# Patient Record
Sex: Male | Born: 1950 | Race: White | Hispanic: No | Marital: Married | Smoking: Never smoker
Health system: Southern US, Community
[De-identification: ages and names within clinical notes are randomized; demographics above are authoritative.]

## PROBLEM LIST (undated history)

## (undated) DIAGNOSIS — T7840XA Allergy, unspecified, initial encounter: Secondary | ICD-10-CM

## (undated) DIAGNOSIS — R7303 Prediabetes: Secondary | ICD-10-CM

## (undated) DIAGNOSIS — I10 Essential (primary) hypertension: Secondary | ICD-10-CM

## (undated) DIAGNOSIS — H269 Unspecified cataract: Secondary | ICD-10-CM

## (undated) DIAGNOSIS — E785 Hyperlipidemia, unspecified: Secondary | ICD-10-CM

## (undated) DIAGNOSIS — J449 Chronic obstructive pulmonary disease, unspecified: Secondary | ICD-10-CM

## (undated) DIAGNOSIS — R001 Bradycardia, unspecified: Secondary | ICD-10-CM

## (undated) DIAGNOSIS — M1990 Unspecified osteoarthritis, unspecified site: Secondary | ICD-10-CM

## (undated) DIAGNOSIS — F419 Anxiety disorder, unspecified: Secondary | ICD-10-CM

## (undated) DIAGNOSIS — E119 Type 2 diabetes mellitus without complications: Secondary | ICD-10-CM

## (undated) HISTORY — DX: Hyperlipidemia, unspecified: E78.5

## (undated) HISTORY — DX: Essential (primary) hypertension: I10

## (undated) HISTORY — DX: Type 2 diabetes mellitus without complications: E11.9

## (undated) HISTORY — DX: Unspecified osteoarthritis, unspecified site: M19.90

## (undated) HISTORY — DX: Chronic obstructive pulmonary disease, unspecified: J44.9

## (undated) HISTORY — DX: Unspecified cataract: H26.9

## (undated) HISTORY — PX: INGUINAL HERNIA REPAIR: SUR1180

## (undated) HISTORY — DX: Anxiety disorder, unspecified: F41.9

## (undated) HISTORY — DX: Bradycardia, unspecified: R00.1

## (undated) HISTORY — DX: Prediabetes: R73.03

## (undated) HISTORY — DX: Allergy, unspecified, initial encounter: T78.40XA

---

## 1976-11-09 HISTORY — PX: OTHER SURGICAL HISTORY: SHX169

## 2008-01-20 ENCOUNTER — Ambulatory Visit: Payer: Self-pay | Admitting: Family Medicine

## 2009-10-01 ENCOUNTER — Ambulatory Visit: Payer: Self-pay | Admitting: General Surgery

## 2009-10-01 LAB — HM COLONOSCOPY

## 2010-11-04 ENCOUNTER — Ambulatory Visit: Payer: Self-pay | Admitting: Family Medicine

## 2013-09-07 LAB — CBC AND DIFFERENTIAL
HEMATOCRIT: 45 % (ref 41–53)
Hemoglobin: 15.7 g/dL (ref 13.5–17.5)
NEUTROS ABS: 4 /uL
PLATELETS: 192 10*3/uL (ref 150–399)
WBC: 6 10^3/mL

## 2013-09-07 LAB — TSH: TSH: 1.96 u[IU]/mL (ref <=5.90)

## 2013-09-07 LAB — PSA: PSA: 1.3

## 2013-12-19 LAB — HEMOGLOBIN A1C: Hgb A1c MFr Bld: 5.2 % (ref 4.0–6.0)

## 2013-12-25 ENCOUNTER — Emergency Department: Payer: Self-pay | Admitting: Emergency Medicine

## 2014-06-28 LAB — LIPID PANEL
CHOLESTEROL: 141 mg/dL (ref 0–200)
HDL: 51 mg/dL (ref 35–70)
LDL Cholesterol: 71 mg/dL
LDl/HDL Ratio: 1.4
Triglycerides: 96 mg/dL (ref 40–160)

## 2014-06-28 LAB — BASIC METABOLIC PANEL
BUN: 27 mg/dL — AB (ref 4–21)
Creatinine: 1.2 mg/dL (ref 0.6–1.3)
Glucose: 131 mg/dL
POTASSIUM: 5.1 mmol/L (ref 3.4–5.3)
SODIUM: 143 mmol/L (ref 137–147)

## 2014-06-28 LAB — HEPATIC FUNCTION PANEL
ALK PHOS: 59 U/L (ref 25–125)
ALT: 41 U/L — AB (ref 10–40)
AST: 21 U/L (ref 14–40)

## 2015-01-01 DIAGNOSIS — Z0000 Encounter for general adult medical examination without abnormal findings: Secondary | ICD-10-CM | POA: Diagnosis not present

## 2015-01-01 DIAGNOSIS — Z23 Encounter for immunization: Secondary | ICD-10-CM | POA: Diagnosis not present

## 2015-05-08 DIAGNOSIS — Z7289 Other problems related to lifestyle: Secondary | ICD-10-CM | POA: Insufficient documentation

## 2015-05-08 DIAGNOSIS — H9190 Unspecified hearing loss, unspecified ear: Secondary | ICD-10-CM | POA: Insufficient documentation

## 2015-05-08 DIAGNOSIS — G4700 Insomnia, unspecified: Secondary | ICD-10-CM | POA: Insufficient documentation

## 2015-05-08 DIAGNOSIS — Z789 Other specified health status: Secondary | ICD-10-CM | POA: Insufficient documentation

## 2015-05-08 DIAGNOSIS — K219 Gastro-esophageal reflux disease without esophagitis: Secondary | ICD-10-CM | POA: Insufficient documentation

## 2015-05-08 DIAGNOSIS — E559 Vitamin D deficiency, unspecified: Secondary | ICD-10-CM | POA: Insufficient documentation

## 2015-05-08 DIAGNOSIS — E1169 Type 2 diabetes mellitus with other specified complication: Secondary | ICD-10-CM | POA: Insufficient documentation

## 2015-05-08 DIAGNOSIS — E785 Hyperlipidemia, unspecified: Secondary | ICD-10-CM | POA: Insufficient documentation

## 2015-05-08 DIAGNOSIS — F320 Major depressive disorder, single episode, mild: Secondary | ICD-10-CM | POA: Insufficient documentation

## 2015-05-08 DIAGNOSIS — R739 Hyperglycemia, unspecified: Secondary | ICD-10-CM | POA: Insufficient documentation

## 2015-05-08 DIAGNOSIS — F419 Anxiety disorder, unspecified: Secondary | ICD-10-CM | POA: Insufficient documentation

## 2015-05-08 DIAGNOSIS — I10 Essential (primary) hypertension: Secondary | ICD-10-CM | POA: Insufficient documentation

## 2015-05-08 DIAGNOSIS — M1990 Unspecified osteoarthritis, unspecified site: Secondary | ICD-10-CM | POA: Insufficient documentation

## 2015-05-23 ENCOUNTER — Telehealth: Payer: Self-pay | Admitting: Family Medicine

## 2015-07-01 ENCOUNTER — Ambulatory Visit (INDEPENDENT_AMBULATORY_CARE_PROVIDER_SITE_OTHER): Payer: Medicare Other | Admitting: Family Medicine

## 2015-07-01 ENCOUNTER — Encounter: Payer: Self-pay | Admitting: Family Medicine

## 2015-07-01 VITALS — BP 142/76 | HR 58 | Temp 97.9°F | Resp 16 | Wt 225.0 lb

## 2015-07-01 DIAGNOSIS — R739 Hyperglycemia, unspecified: Secondary | ICD-10-CM | POA: Diagnosis not present

## 2015-07-01 DIAGNOSIS — I10 Essential (primary) hypertension: Secondary | ICD-10-CM

## 2015-07-01 DIAGNOSIS — E785 Hyperlipidemia, unspecified: Secondary | ICD-10-CM

## 2015-07-01 MED ORDER — OLMESARTAN-AMLODIPINE-HCTZ 40-10-25 MG PO TABS: 1.0000 | ORAL_TABLET | Freq: Every day | ORAL | Status: DC

## 2015-07-01 NOTE — Patient Instructions (Signed)
Bring in a record of Blood pressure readings, starting now until your next office visit.

## 2015-07-01 NOTE — Progress Notes (Signed)
Patient ID: Christian [name], male   DOB: June 01, 1951, 64 y.o.   MRN: [id number]    Subjective:  HPI  Hypertension, follow-up:  BP Readings from Last 3 Encounters:  07/01/15 142/76  01/01/15 126/72    He was last seen for hypertension 6 months ago.  BP at that visit was 126/72. Management changes since that visit include none. He reports good compliance with treatment. He is not having side effects.  He is not exercising. He is adherent to low salt diet.   Outside blood pressures are 140's/70's. He is experiencing none.    Weight trend: stable Wt Readings from Last 3 Encounters:  07/01/15 225 lb (102.059 kg)  01/01/15 220 lb (99.791 kg)     ------------------------------------------------------------------------   Lipid/Cholesterol, Follow-up:   Last seen for this6 months ago.  Management changes since that visit include none. . Last Lipid Panel:    Component Value Date/Time   CHOL 141 06/28/2014   TRIG 96 06/28/2014   HDL 51 06/28/2014   LDLCALC 71 06/28/2014    He reports good compliance with treatment. He is not having side effects.  Current symptoms include none  Wt Readings from Last 3 Encounters:  07/01/15 225 lb (102.059 kg)  01/01/15 220 lb (99.791 kg)    ------------------------------------------------------------------- Pt is due for labs. According to note on 06/28/14, pt needs Calcium, PTH and A1C checked at next OV.  Prior to Admission medications   Medication Sig Start Date End Date Taking? Authorizing Provider  aspirin 81 MG tablet Take by mouth. 02/11/12  Yes Historical Provider, MD  atorvastatin (LIPITOR) 40 MG tablet Take by mouth. 04/01/15  Yes Historical Provider, MD  cholecalciferol (VITAMIN D) 1000 UNITS tablet Take by mouth. 02/11/12  Yes Historical Provider, MD  MULTIPLE VITAMINS PO Take by mouth. 02/11/12  Yes Historical Provider, MD  Olmesartan-Amlodipine-HCTZ (TRIBENZOR) 40-5-25 MG TABS Take by mouth. 02/25/15  Yes Historical  Provider, MD    Patient Active Problem List   Diagnosis Date Noted  . Alcohol drinker 05/08/2015  . Anxiety 05/08/2015  . Depression, major, single episode, mild 05/08/2015  . Acid reflux 05/08/2015  . Difficulty hearing 05/08/2015  . Blood glucose elevated 05/08/2015  . HLD (hyperlipidemia) 05/08/2015  . BP (high blood pressure) 05/08/2015  . Cannot sleep 05/08/2015  . Osteoarthrosis 05/08/2015  . Avitaminosis D 05/08/2015    History reviewed. No pertinent past medical history.  Social History   Social History  . Marital Status: Married    Spouse Name: N/A  . Number of Children: N/A  . Years of Education: N/A   Occupational History  . Not on file.   Social History Main Topics  . Smoking status: Never Smoker   . Smokeless tobacco: Not on file  . Alcohol Use: Yes     Comment: 12-14 Drinks a week.  . Drug Use: No  . Sexual Activity: Not on file   Other Topics Concern  . Not on file   Social History Narrative    No Known Allergies  Review of Systems  Constitutional: Negative.   HENT: Negative.   Eyes: Negative.   Cardiovascular: Negative.   Gastrointestinal: Negative.   Musculoskeletal: Negative.   Skin: Negative.   Neurological: Negative.   Endo/Heme/Allergies: Negative.   Psychiatric/Behavioral: Negative.   All other systems reviewed and are negative.   Immunization History  Administered Date(s) Administered  . Pneumococcal Conjugate-13 01/01/2015  . Tdap 07/29/2011  . Zoster 09/07/2013   Objective:  BP 142/76 mmHg  Pulse  58  Temp(Src) 97.9 F (36.6 C) (Oral)  Resp 16  Wt 225 lb (102.059 kg)  Physical Exam  Constitutional: He is oriented to person, place, and time and well-developed, well-nourished, and in no distress.  HENT:  Head: Normocephalic and atraumatic.  Right Ear: External ear normal.  Left Ear: External ear normal.  Nose: Nose normal.  Eyes: Conjunctivae and EOM are normal. Pupils are equal, round, and reactive to light.    Neck: Normal range of motion. Neck supple.  Cardiovascular: Normal rate, regular rhythm, normal heart sounds and intact distal pulses.   Pulmonary/Chest: Effort normal and breath sounds normal.  Neurological: He is alert and oriented to person, place, and time. He has normal reflexes. Gait normal. GCS score is 15.  Skin: Skin is warm and dry.  Psychiatric: Mood, memory, affect and judgment normal.    Lab Results  Component Value Date   WBC 6.0 09/07/2013   HGB 15.7 09/07/2013   HCT 45 09/07/2013   PLT 192 09/07/2013   CHOL 141 06/28/2014   TRIG 96 06/28/2014   HDL 51 06/28/2014   LDLCALC 71 06/28/2014   TSH 1.96 09/07/2013   PSA 1.3 09/07/2013   HGBA1C 5.2 12/19/2013    CMP     Component Value Date/Time   NA 143 06/28/2014   K 5.1 06/28/2014   BUN 27* 06/28/2014   CREATININE 1.2 06/28/2014   AST 21 06/28/2014   ALT 41* 06/28/2014   ALKPHOS 59 06/28/2014    Assessment and Plan :  1. Essential hypertension Increase amlodipine dose in the Tribenzor. Patient is warned about the side effects of  edema - Olmesartan-Amlodipine-HCTZ 40-10-25 MG TABS; Take 1 tablet by mouth daily.  Dispense: 30 tablet; Refill: 12 - CBC with Differential/Platelet - TSH  2. HLD (hyperlipidemia)  - Lipid Panel With LDL/HDL Ratio - COMPLETE METABOLIC PANEL WITH GFR  3. Blood glucose elevated  - Hemoglobin A1c - COMPLETE METABOLIC PANEL WITH GFR  4. Hypercalcemia  - Parathyroid hormone, intact (no Ca) 5. Obesity  Julieanne Manson MD [facility] 07/01/2015 8:27 AM

## 2015-07-02 LAB — CBC WITH DIFFERENTIAL/PLATELET
Basophils Absolute: 0 10*3/uL (ref 0.0–0.2)
Basos: 0 %
EOS (ABSOLUTE): 0.1 10*3/uL (ref 0.0–0.4)
EOS: 2 %
Hematocrit: 42.9 % (ref 37.5–51.0)
Hemoglobin: 15.3 g/dL (ref 12.6–17.7)
IMMATURE GRANULOCYTES: 0 %
Immature Grans (Abs): 0 10*3/uL (ref 0.0–0.1)
Lymphocytes Absolute: 1.2 10*3/uL (ref 0.7–3.1)
Lymphs: 21 %
MCH: 32.3 pg (ref 26.6–33.0)
MCHC: 35.7 g/dL (ref 31.5–35.7)
MCV: 91 fL (ref 79–97)
MONOS ABS: 0.4 10*3/uL (ref 0.1–0.9)
Monocytes: 7 %
NEUTROS PCT: 70 %
Neutrophils Absolute: 3.9 10*3/uL (ref 1.4–7.0)
PLATELETS: 202 10*3/uL (ref 150–379)
RBC: 4.74 x10E6/uL (ref 4.14–5.80)
RDW: 14.2 % (ref 12.3–15.4)
WBC: 5.7 10*3/uL (ref 3.4–10.8)

## 2015-07-02 LAB — TSH: TSH: 1.57 u[IU]/mL (ref 0.450–4.500)

## 2015-07-02 LAB — COMPREHENSIVE METABOLIC PANEL
A/G RATIO: 1.8 (ref 1.1–2.5)
ALK PHOS: 61 IU/L (ref 39–117)
ALT: 34 IU/L (ref 0–44)
AST: 18 IU/L (ref 0–40)
Albumin: 4.5 g/dL (ref 3.6–4.8)
BILIRUBIN TOTAL: 0.6 mg/dL (ref 0.0–1.2)
BUN/Creatinine Ratio: 19 (ref 10–22)
BUN: 20 mg/dL (ref 8–27)
CHLORIDE: 101 mmol/L (ref 97–108)
CO2: 25 mmol/L (ref 18–29)
Calcium: 9.9 mg/dL (ref 8.6–10.2)
Creatinine, Ser: 1.05 mg/dL (ref 0.76–1.27)
GFR calc non Af Amer: 74 mL/min/{1.73_m2} (ref >59)
GFR, EST AFRICAN AMERICAN: 85 mL/min/{1.73_m2} (ref >59)
GLUCOSE: 130 mg/dL — AB (ref 65–99)
Globulin, Total: 2.5 g/dL (ref 1.5–4.5)
POTASSIUM: 5 mmol/L (ref 3.5–5.2)
Sodium: 140 mmol/L (ref 134–144)
Total Protein: 7 g/dL (ref 6.0–8.5)

## 2015-07-02 LAB — LIPID PANEL WITH LDL/HDL RATIO
Cholesterol, Total: 138 mg/dL (ref 100–199)
HDL: 47 mg/dL (ref >39)
LDL CALC: 66 mg/dL (ref 0–99)
LDl/HDL Ratio: 1.4 ratio units (ref 0.0–3.6)
Triglycerides: 125 mg/dL (ref 0–149)
VLDL Cholesterol Cal: 25 mg/dL (ref 5–40)

## 2015-07-02 LAB — HEMOGLOBIN A1C
ESTIMATED AVERAGE GLUCOSE: 128 mg/dL
Hgb A1c MFr Bld: 6.1 % — ABNORMAL HIGH (ref 4.8–5.6)

## 2015-07-02 LAB — PARATHYROID HORMONE, INTACT (NO CA): PTH: 30 pg/mL (ref 15–65)

## 2015-07-24 ENCOUNTER — Other Ambulatory Visit: Payer: Self-pay | Admitting: Family Medicine

## 2015-08-26 DIAGNOSIS — H10213 Acute toxic conjunctivitis, bilateral: Secondary | ICD-10-CM | POA: Diagnosis not present

## 2015-09-09 ENCOUNTER — Encounter: Payer: Self-pay | Admitting: Family Medicine

## 2015-09-09 ENCOUNTER — Ambulatory Visit (INDEPENDENT_AMBULATORY_CARE_PROVIDER_SITE_OTHER): Payer: Medicare Other | Admitting: Family Medicine

## 2015-09-09 VITALS — BP 120/68 | HR 78 | Temp 98.0°F | Resp 16 | Wt 227.0 lb

## 2015-09-09 DIAGNOSIS — I10 Essential (primary) hypertension: Secondary | ICD-10-CM

## 2015-09-09 DIAGNOSIS — Z23 Encounter for immunization: Secondary | ICD-10-CM | POA: Diagnosis not present

## 2015-09-09 NOTE — Progress Notes (Signed)
Patient ID: Christian FleetingStephen L Ocain, male   DOB: [birth year], 64 y.o.   MRN: [id number]    Subjective:  HPI  Hypertension, follow-up:  BP Readings from Last 3 Encounters:  09/09/15 120/68  07/01/15 142/76  01/01/15 126/72    He was last seen for hypertension 2 months ago.  BP at that visit was 142/76. Management since that visit includes increase the Amlodipine part of the Tribenzor. He reports good compliance with treatment. He is having possible side effects, he reports that he has had a little hacky cough since the increase and mild ankle swelling. IT happens more at night and in the mornings. He is not exercising. Outside blood pressures are 120-140/ 60-70's. Patient denies chest pain, chest pressure/discomfort, claudication, exertional chest pressure/discomfort, fatigue and palpitations.     Wt Readings from Last 3 Encounters:  09/09/15 227 lb (102.967 kg)  07/01/15 225 lb (102.059 kg)  01/01/15 220 lb (99.791 kg)     ------------------------------------------------------------------------     Prior to Admission medications   Medication Sig Start Date End Date Taking? Authorizing Provider  aspirin 81 MG tablet Take by mouth. 02/11/12  Yes Historical Provider, MD  atorvastatin (LIPITOR) 40 MG tablet Take 1 tablet by mouth at  bedtime 07/24/15  Yes Austina Constantin Hulen ShoutsL Devlyn Parish Jr., MD  cholecalciferol (VITAMIN D) 1000 UNITS tablet Take by mouth. 02/11/12  Yes Historical Provider, MD  MULTIPLE VITAMINS PO Take by mouth. 02/11/12  Yes Historical Provider, MD  Olmesartan-Amlodipine-HCTZ 40-10-25 MG TABS Take 1 tablet by mouth daily. 07/01/15   Maple Hudsonichard L Raimi Guillermo Jr., MD    Patient Active Problem List   Diagnosis Date Noted  . Hypercalcemia 07/01/2015  . Alcohol drinker (HCC) 05/08/2015  . Anxiety 05/08/2015  . Depression, major, single episode, mild (HCC) 05/08/2015  . Acid reflux 05/08/2015  . Difficulty hearing 05/08/2015  . Blood glucose elevated 05/08/2015  . HLD (hyperlipidemia)  05/08/2015  . BP (high blood pressure) 05/08/2015  . Cannot sleep 05/08/2015  . Osteoarthrosis 05/08/2015  . Avitaminosis D 05/08/2015    History reviewed. No pertinent past medical history.  Social History   Social History  . Marital Status: Married    Spouse Name: N/A  . Number of Children: N/A  . Years of Education: N/A   Occupational History  . Not on file.   Social History Main Topics  . Smoking status: Never Smoker   . Smokeless tobacco: Not on file  . Alcohol Use: Yes     Comment: 12-14 Drinks a week.  . Drug Use: No  . Sexual Activity: Not on file   Other Topics Concern  . Not on file   Social History Narrative    No Known Allergies  Review of Systems  Constitutional: Negative.   HENT: Negative.   Eyes: Negative.   Respiratory: Negative.   Cardiovascular: Negative.   Gastrointestinal: Negative.   Genitourinary: Negative.   Musculoskeletal: Negative.   Skin: Negative.   Neurological: Negative.   Endo/Heme/Allergies: Negative.   Psychiatric/Behavioral: Negative.     Immunization History  Administered Date(s) Administered  . Pneumococcal Conjugate-13 01/01/2015  . Tdap 07/29/2011  . Zoster 09/07/2013   Objective:  BP 120/68 mmHg  Pulse 78  Temp(Src) 98 F (36.7 C) (Oral)  Resp 16  Wt 227 lb (102.967 kg)  Physical Exam  Constitutional: He is oriented to person, place, and time and well-developed, well-nourished, and in no distress.  HENT:  Head: Normocephalic and atraumatic.  Right Ear: External ear normal.  Left Ear: External  ear normal.  Nose: Nose normal.  Mouth/Throat: Oropharynx is clear and moist.  Eyes: Conjunctivae and EOM are normal. Pupils are equal, round, and reactive to light.  Neck: Normal range of motion. Neck supple.  Cardiovascular: Normal rate, regular rhythm, normal heart sounds and intact distal pulses.   Pulmonary/Chest: Effort normal and breath sounds normal.  Abdominal: Soft. Bowel sounds are normal.    Musculoskeletal: Normal range of motion. He exhibits edema (1+).  Neurological: He is oriented to person, place, and time. He has normal reflexes. Gait normal. GCS score is 15.  Skin: Skin is warm and dry.  Psychiatric: Mood, memory, affect and judgment normal.    Lab Results  Component Value Date   WBC 5.7 07/01/2015   HGB 15.7 09/07/2013   HCT 42.9 07/01/2015   PLT 192 09/07/2013   GLUCOSE 130* 07/01/2015   CHOL 138 07/01/2015   TRIG 125 07/01/2015   HDL 47 07/01/2015   LDLCALC 66 07/01/2015   TSH 1.570 07/01/2015   PSA 1.3 09/07/2013   HGBA1C 6.1* 07/01/2015    CMP     Component Value Date/Time   NA 140 07/01/2015 0914   K 5.0 07/01/2015 0914   CL 101 07/01/2015 0914   CO2 25 07/01/2015 0914   GLUCOSE 130* 07/01/2015 0914   BUN 20 07/01/2015 0914   CREATININE 1.05 07/01/2015 0914   CREATININE 1.2 06/28/2014   CALCIUM 9.9 07/01/2015 0914   PROT 7.0 07/01/2015 0914   ALBUMIN 4.5 07/01/2015 0914   AST 18 07/01/2015 0914   ALT 34 07/01/2015 0914   ALKPHOS 61 07/01/2015 0914   BILITOT 0.6 07/01/2015 0914   GFRNONAA 74 07/01/2015 0914   GFRAA 85 07/01/2015 0914    Assessment and Plan :  1. Essential hypertension Improved. Continue current dose. Follow in 4-6 months.   2. Need for influenza vaccination  - Flu vaccine HIGH DOSE PF 3. Obesity Patient has gained 10 pounds this year. Diet and exercise stressed. Patient was seen and examined by Dr. Julieanne Manson, and noted scribed by Dimas Chyle, CMA   Julieanne Manson MD [facility] 09/09/2015 8:42 AM

## 2015-09-09 NOTE — Patient Instructions (Signed)
Try Claritin OTC for allergies, take one daily.

## 2016-01-07 ENCOUNTER — Ambulatory Visit (INDEPENDENT_AMBULATORY_CARE_PROVIDER_SITE_OTHER): Payer: Medicare Other | Admitting: Family Medicine

## 2016-01-07 VITALS — BP 118/80 | HR 60 | Temp 98.1°F | Resp 16 | Ht 71.0 in | Wt 228.0 lb

## 2016-01-07 DIAGNOSIS — Z0000 Encounter for general adult medical examination without abnormal findings: Secondary | ICD-10-CM

## 2016-01-07 DIAGNOSIS — M7581 Other shoulder lesions, right shoulder: Secondary | ICD-10-CM | POA: Diagnosis not present

## 2016-01-07 DIAGNOSIS — Z23 Encounter for immunization: Secondary | ICD-10-CM

## 2016-01-07 DIAGNOSIS — M778 Other enthesopathies, not elsewhere classified: Secondary | ICD-10-CM

## 2016-01-07 MED ORDER — NAPROXEN SODIUM 220 MG PO TABS
220.0000 mg | ORAL_TABLET | Freq: Two times a day (BID) | ORAL | Status: DC
Start: 2016-01-07 — End: 2016-11-05

## 2016-01-07 NOTE — Progress Notes (Signed)
Patient ID: Christian [name], male   DOB: 05/20/1951, 65 y.o.   MRN: [id number] Patient: Christian [name], Male    DOB: [birth year], 65 y.o.   MRN: [id number] Visit Date: 01/07/2016  Today's Provider: Megan Mans, MD   Chief Complaint  Patient presents with  . Medicare Wellness   Subjective:   Christian [name] is a 65 y.o. male who presents today for his Subsequent Annual Wellness Visit. He feels well. He reports exercising none. He reports he is sleeping well.  Patient is up to date on vaccines except for his Pneumovax which is due today. 07/29/11  Tdap 09/07/13  Zoster 01/01/15  Prevnar  10/01/09  Colonoscopy-diverticulosis, hyperplastic polyp, recommend repeat in 10 unless symptoms require otherwise.   Review of Systems  Constitutional: Negative.   HENT: Negative.   Eyes: Negative.   Respiratory: Negative.   Cardiovascular: Negative.   Gastrointestinal: Negative.   Endocrine: Negative.   Genitourinary: Negative.   Musculoskeletal: Negative.   Skin: Negative.   Allergic/Immunologic: Negative.   Neurological: Negative.   Hematological: Negative.   Psychiatric/Behavioral: Negative.     Patient Active Problem List   Diagnosis Date Noted  . Hypercalcemia 07/01/2015  . Alcohol drinker (HCC) 05/08/2015  . Anxiety 05/08/2015  . Depression, major, single episode, mild (HCC) 05/08/2015  . Acid reflux 05/08/2015  . Difficulty hearing 05/08/2015  . Blood glucose elevated 05/08/2015  . HLD (hyperlipidemia) 05/08/2015  . BP (high blood pressure) 05/08/2015  . Cannot sleep 05/08/2015  . Osteoarthrosis 05/08/2015  . Avitaminosis D 05/08/2015    Social History   Social History  . Marital Status: Married    Spouse Name: N/A  . Number of Children: N/A  . Years of Education: N/A   Occupational History  . Not on file.   Social History Main Topics  . Smoking status: Never Smoker   . Smokeless tobacco: Not on file  . Alcohol Use: Yes     Comment: 12-14 Drinks  a week.  . Drug Use: No  . Sexual Activity: Not on file   Other Topics Concern  . Not on file   Social History Narrative    Past Surgical History  Procedure Laterality Date  . Inguinal hernia repair Left     His family history includes Colon cancer in his father; Dementia in his mother; Hypertension in his sister and son.    Outpatient Prescriptions Prior to Visit  Medication Sig Dispense Refill  . aspirin 81 MG tablet Take by mouth.    Marland Kitchen atorvastatin (LIPITOR) 40 MG tablet Take 1 tablet by mouth at  bedtime 90 tablet 3  . cholecalciferol (VITAMIN D) 1000 UNITS tablet Take by mouth.    . MULTIPLE VITAMINS PO Take by mouth.    . Olmesartan-Amlodipine-HCTZ 40-10-25 MG TABS Take 1 tablet by mouth daily. 30 tablet 12   No facility-administered medications prior to visit.    No Known Allergies  Patient Care Team: Maple Hudson., MD as PCP - General (Family Medicine)  Objective:   Vitals:  Filed Vitals:   01/07/16 0940  BP: 118/80  Pulse: 60  Temp: 98.1 F (36.7 C)  TempSrc: Oral  Resp: 16  Height:  (1.803 m)  Weight: 228 lb (103.42 kg)    Physical Exam  Constitutional: He is oriented to person, place, and time. He appears well-developed and well-nourished.  HENT:  Head: Normocephalic and atraumatic.  Right Ear: External ear normal.  Left Ear: External ear normal.  Nose:  Nose normal.  Mouth/Throat: Oropharynx is clear and moist.  Eyes: Conjunctivae and EOM are normal. Pupils are equal, round, and reactive to light.  Neck: Normal range of motion. Neck supple.  Cardiovascular: Normal rate, regular rhythm, normal heart sounds and intact distal pulses.   Pulmonary/Chest: Effort normal and breath sounds normal.  Abdominal: Soft. Bowel sounds are normal.  Genitourinary: Rectum normal, prostate normal and penis normal.  Musculoskeletal: Normal range of motion.  Neurological: He is alert and oriented to person, place, and time.  Skin: Skin is warm and  dry.  Psychiatric: He has a normal mood and affect. His behavior is normal. Judgment and thought content normal.    Activities of Daily Living In your present state of health, do you have any difficulty performing the following activities: 01/07/2016 09/09/2015  Hearing? Y N  Vision? N N  Difficulty concentrating or making decisions? N N  Walking or climbing stairs? N N  Dressing or bathing? N N  Doing errands, shopping? N N    Fall Risk Assessment Fall Risk  01/07/2016 09/09/2015  Falls in the past year? No No     Depression Screen PHQ 2/9 Scores 01/07/2016 09/09/2015  PHQ - 2 Score 0 0    Cognitive Testing - 6-CIT    Year: 0 4 points  Month: 0 3 points  Memorize "Floyde Parkins, [address], [address], [place]"  Time (within 1 hour:) 0 3 points  Count backwards from 20: 0 2 4 points  Name months of year: 0 2 4 points  Repeat Address: 0 points   Total Score: 3/28  Interpretation : Normal (0-7) Abnormal (8-28)    Assessment & Plan:     Annual Wellness Visit  Reviewed patient's Family Medical History Reviewed and updated list of patient's medical providers Assessment of cognitive impairment was done Assessed patient's functional ability Established a written schedule for health screening services Health Risk Assessent Completed and Reviewed  Exercise Activities and Dietary recommendations Goals    None      Immunization History  Administered Date(s) Administered  . Influenza, High Dose Seasonal PF 09/09/2015  . Pneumococcal Conjugate-13 01/01/2015  . Tdap 07/29/2011  . Zoster 09/07/2013    Health Maintenance  Topic Date Due  . Hepatitis C Screening  1951/09/26  . PNA vac Low Risk Adult (2 of 2 - PPSV23) 01/02/2016  . INFLUENZA VACCINE  06/09/2016  . COLONOSCOPY  10/02/2019  . TETANUS/TDAP  07/28/2021  . ZOSTAVAX  Completed     1. Medicare annual wellness visit, subsequent   2. Need for pneumococcal vaccine  - Pneumococcal polysaccharide  vaccine 23-valent greater than or equal to 2yo subcutaneous/IM  3. Tendonitis of shoulder, right  - naproxen sodium (ALEVE) 220 MG tablet; Take 1 tablet (220 mg total) by mouth 2 (two) times daily with a meal.  Dispense: 60 tablet; Refill: 0     Discussed health benefits of physical activity, and encouraged him to engage in regular exercise appropriate for his age and condition.  I have done the exam and reviewed the above chart and it is accurate to the best of my knowledge.   Julieanne Manson MD [facility] 01/07/2016 9:44 AM  ------------------------------------------------------------------------------------------------------------

## 2016-06-15 NOTE — Telephone Encounter (Signed)
error 

## 2016-07-10 ENCOUNTER — Other Ambulatory Visit: Payer: Self-pay | Admitting: Family Medicine

## 2016-07-14 ENCOUNTER — Ambulatory Visit (INDEPENDENT_AMBULATORY_CARE_PROVIDER_SITE_OTHER): Payer: Medicare Other | Admitting: Family Medicine

## 2016-07-14 ENCOUNTER — Ambulatory Visit
Admission: RE | Admit: 2016-07-14 | Discharge: 2016-07-14 | Disposition: A | Discharge status: Home / Self Care | Payer: Medicare Other | Source: Ambulatory Visit | Attending: Family Medicine | Admitting: Family Medicine

## 2016-07-14 VITALS — BP 108/64 | HR 62 | Temp 98.4°F | Resp 12 | Wt 230.0 lb

## 2016-07-14 DIAGNOSIS — R739 Hyperglycemia, unspecified: Secondary | ICD-10-CM

## 2016-07-14 DIAGNOSIS — R059 Cough, unspecified: Secondary | ICD-10-CM

## 2016-07-14 DIAGNOSIS — R05 Cough: Secondary | ICD-10-CM

## 2016-07-14 DIAGNOSIS — E785 Hyperlipidemia, unspecified: Secondary | ICD-10-CM

## 2016-07-14 DIAGNOSIS — Z23 Encounter for immunization: Secondary | ICD-10-CM

## 2016-07-14 DIAGNOSIS — I10 Essential (primary) hypertension: Secondary | ICD-10-CM | POA: Diagnosis not present

## 2016-07-14 DIAGNOSIS — E669 Obesity, unspecified: Secondary | ICD-10-CM

## 2016-07-14 LAB — POCT GLYCOSYLATED HEMOGLOBIN (HGB A1C): HEMOGLOBIN A1C: 5.7

## 2016-07-14 NOTE — Progress Notes (Signed)
Subjective:  HPI  Patient is here for 6 months follow up. Hypertension: patient checks his b/p sometimes and last time it was 142/67. No cardiac symptoms present. BP Readings from Last 3 Encounters:  07/14/16 108/64  01/07/16 118/80  09/09/15 120/68   Hyperlipidemia: Lab Results  Component Value Date   CHOL 138 07/01/2015   HDL 47 07/01/2015   LDLCALC 66 07/01/2015   TRIG 125 07/01/2015   Hyperglycemia: patient does not check his sugar at home. No numbness or tingling sensation present. He is doing well but is not working on his diet and exercise habits. He is drinking less alcohol than he used to and is doing well with that. Lab Results  Component Value Date   HGBA1C 6.1 (H) 07/01/2015    Prior to Admission medications   Medication Sig Start Date End Date Taking? Authorizing Provider  aspirin 81 MG tablet Take by mouth. 02/11/12  Yes Historical Provider, MD  atorvastatin (LIPITOR) 40 MG tablet Take 1 tablet by mouth at  bedtime 07/10/16  Yes Linzee Depaul Hulen Shouts., MD  cholecalciferol (VITAMIN D) 1000 UNITS tablet Take by mouth. 02/11/12  Yes Historical Provider, MD  MULTIPLE VITAMINS PO Take by mouth. 02/11/12  Yes Historical Provider, MD  naproxen sodium (ALEVE) 220 MG tablet Take 1 tablet (220 mg total) by mouth 2 (two) times daily with a meal. 01/07/16  Yes Maple Hudson., MD  Olmesartan-Amlodipine-HCTZ 40-10-25 MG TABS Take 1 tablet by mouth daily. 07/01/15  Yes Mcguire Gasparyan Hulen Shouts., MD    Patient Active Problem List   Diagnosis Date Noted  . Hypercalcemia 07/01/2015  . Alcohol drinker (HCC) 05/08/2015  . Anxiety 05/08/2015  . Depression, major, single episode, mild (HCC) 05/08/2015  . Acid reflux 05/08/2015  . Difficulty hearing 05/08/2015  . Blood glucose elevated 05/08/2015  . HLD (hyperlipidemia) 05/08/2015  . BP (high blood pressure) 05/08/2015  . Cannot sleep 05/08/2015  . Osteoarthrosis 05/08/2015  . Avitaminosis D 05/08/2015    No past medical  history on file.  Social History   Social History  . Marital status: Married    Spouse name: N/A  . Number of children: N/A  . Years of education: N/A   Occupational History  . Not on file.   Social History Main Topics  . Smoking status: Never Smoker  . Smokeless tobacco: Not on file  . Alcohol use Yes     Comment: 12-14 Drinks a week.  . Drug use: No  . Sexual activity: Not on file   Other Topics Concern  . Not on file   Social History Narrative  . No narrative on file    No Known Allergies  Review of Systems  Constitutional: Negative.   HENT: Negative.   Eyes: Negative.   Respiratory: Positive for cough (slightly).   Cardiovascular: Negative.   Gastrointestinal: Negative.   Genitourinary: Negative.   Musculoskeletal: Positive for joint pain (right shoulder pain.).  Skin: Negative.        Telangiectasia of face  Neurological: Negative.   Endo/Heme/Allergies: Negative.   Psychiatric/Behavioral: Negative.     Immunization History  Administered Date(s) Administered  . Influenza, High Dose Seasonal PF 09/09/2015  . Pneumococcal Conjugate-13 01/01/2015  . Pneumococcal Polysaccharide-23 01/07/2016  . Tdap 07/29/2011  . Zoster 09/07/2013   Objective:  BP 108/64   Pulse 62   Temp 98.4 F (36.9 C)   Resp 12   Wt 230 lb (104.3 kg)   BMI 32.08 kg/m  Physical Exam  Constitutional: He is oriented to person, place, and time and well-developed, well-nourished, and in no distress.  HENT:  Head: Normocephalic and atraumatic.  Right Ear: External ear normal.  Left Ear: External ear normal.  Nose: Nose normal.  Eyes: Conjunctivae are normal. Pupils are equal, round, and reactive to light.  Neck: Normal range of motion. Neck supple.  Cardiovascular: Normal rate, regular rhythm, normal heart sounds and intact distal pulses.   No murmur heard. Pulmonary/Chest: Effort normal and breath sounds normal. No respiratory distress. He has no wheezes.  Abdominal: Soft.  He exhibits no distension. There is no tenderness. There is no rebound.  Musculoskeletal: Normal range of motion. He exhibits no edema or tenderness.  Neurological: He is alert and oriented to person, place, and time. Gait normal.  Skin: Skin is warm and dry.  Telangiectasia face  Psychiatric: Mood, memory, affect and judgment normal.    Lab Results  Component Value Date   WBC 5.7 07/01/2015   HGB 15.7 09/07/2013   HCT 42.9 07/01/2015   PLT 202 07/01/2015   GLUCOSE 130 (H) 07/01/2015   CHOL 138 07/01/2015   TRIG 125 07/01/2015   HDL 47 07/01/2015   LDLCALC 66 07/01/2015   TSH 1.570 07/01/2015   PSA 1.3 09/07/2013   HGBA1C 6.1 (H) 07/01/2015    CMP     Component Value Date/Time   NA 140 07/01/2015 0914   K 5.0 07/01/2015 0914   CL 101 07/01/2015 0914   CO2 25 07/01/2015 0914   GLUCOSE 130 (H) 07/01/2015 0914   BUN 20 07/01/2015 0914   CREATININE 1.05 07/01/2015 0914   CALCIUM 9.9 07/01/2015 0914   PROT 7.0 07/01/2015 0914   ALBUMIN 4.5 07/01/2015 0914   AST 18 07/01/2015 0914   ALT 34 07/01/2015 0914   ALKPHOS 61 07/01/2015 0914   BILITOT 0.6 07/01/2015 0914   GFRNONAA 74 07/01/2015 0914   GFRAA 85 07/01/2015 0914    Assessment and Plan :  1. Essential hypertension Stable. - CBC w/Diff/Platelet - Comprehensive metabolic panel  2. HLD (hyperlipidemia) Check labs today. - Lipid Panel With LDL/HDL Ratio  3. Blood glucose elevated A1C 5.7 today, better. Work on habits. Follow. - Comprehensive metabolic panel - POCT HgB A1C  4. Adiposity - TSH  5. Cough Patient thinks its allergy related. Does request a chest xray to make sure. Pending results.  - DG Chest 2 View; Future Likely allergic. If it persists will evaluate. Patient feels it is completely benign and due to allergies. His wife wants a chest x-ray. 6. Need for influenza vaccination Administered today. - Flu vaccine HIGH DOSE PF (Fluzone High dose)  HPI, Exam and A&P Transcribed under the  direction and in the presence of Julieanne Mansonichard Croix Presley. MD. I have done the exam and reviewed the above chart and it is accurate to the best of my knowledge.  Julieanne Mansonichard Cael Worth MD [facility] 07/14/2016 8:16 AM

## 2016-07-15 ENCOUNTER — Telehealth: Payer: Self-pay

## 2016-07-15 LAB — COMPREHENSIVE METABOLIC PANEL
ALK PHOS: 75 IU/L (ref 39–117)
ALT: 40 IU/L (ref 0–44)
AST: 24 IU/L (ref 0–40)
Albumin/Globulin Ratio: 1.7 (ref 1.2–2.2)
Albumin: 4.7 g/dL (ref 3.6–4.8)
BILIRUBIN TOTAL: 0.6 mg/dL (ref 0.0–1.2)
BUN / CREAT RATIO: 22 (ref 10–24)
BUN: 24 mg/dL (ref 8–27)
CHLORIDE: 99 mmol/L (ref 96–106)
CO2: 25 mmol/L (ref 18–29)
CREATININE: 1.1 mg/dL (ref 0.76–1.27)
Calcium: 10 mg/dL (ref 8.6–10.2)
GFR calc Af Amer: 80 mL/min/{1.73_m2} (ref >59)
GFR calc non Af Amer: 69 mL/min/{1.73_m2} (ref >59)
GLOBULIN, TOTAL: 2.7 g/dL (ref 1.5–4.5)
GLUCOSE: 125 mg/dL — AB (ref 65–99)
POTASSIUM: 4.9 mmol/L (ref 3.5–5.2)
SODIUM: 139 mmol/L (ref 134–144)
Total Protein: 7.4 g/dL (ref 6.0–8.5)

## 2016-07-15 LAB — CBC WITH DIFFERENTIAL/PLATELET
Basophils Absolute: 0 10*3/uL (ref 0.0–0.2)
Basos: 0 %
EOS (ABSOLUTE): 0.3 10*3/uL (ref 0.0–0.4)
EOS: 5 %
HEMATOCRIT: 45.6 % (ref 37.5–51.0)
HEMOGLOBIN: 15.5 g/dL (ref 12.6–17.7)
IMMATURE GRANS (ABS): 0 10*3/uL (ref 0.0–0.1)
Immature Granulocytes: 1 %
LYMPHS ABS: 1.3 10*3/uL (ref 0.7–3.1)
Lymphs: 21 %
MCH: 31.9 pg (ref 26.6–33.0)
MCHC: 34 g/dL (ref 31.5–35.7)
MCV: 94 fL (ref 79–97)
MONOCYTES: 6 %
Monocytes Absolute: 0.4 10*3/uL (ref 0.1–0.9)
NEUTROS ABS: 4.3 10*3/uL (ref 1.4–7.0)
Neutrophils: 67 %
Platelets: 207 10*3/uL (ref 150–379)
RBC: 4.86 x10E6/uL (ref 4.14–5.80)
RDW: 13.7 % (ref 12.3–15.4)
WBC: 6.3 10*3/uL (ref 3.4–10.8)

## 2016-07-15 LAB — LIPID PANEL WITH LDL/HDL RATIO
CHOLESTEROL TOTAL: 132 mg/dL (ref 100–199)
HDL: 43 mg/dL (ref >39)
LDL CALC: 69 mg/dL (ref 0–99)
LDl/HDL Ratio: 1.6 ratio units (ref 0.0–3.6)
Triglycerides: 100 mg/dL (ref 0–149)
VLDL CHOLESTEROL CAL: 20 mg/dL (ref 5–40)

## 2016-07-15 LAB — TSH: TSH: 1.08 u[IU]/mL (ref 0.450–4.500)

## 2016-07-15 NOTE — Telephone Encounter (Signed)
Advised pt of lab results. Pt verbally acknowledges understanding. Xai Frerking Drozdowski, CMA   

## 2016-07-15 NOTE — Telephone Encounter (Signed)
-----   Message from Maple Hudsonichard L Gilbert Jr., MD sent at 07/15/2016 11:22 AM EDT ----- Labs stable except for borderline diabetes if this was fasting

## 2016-07-22 ENCOUNTER — Ambulatory Visit
Admission: RE | Admit: 2016-07-22 | Discharge: 2016-07-22 | Disposition: A | Discharge status: Home / Self Care | Payer: Medicare Other | Source: Ambulatory Visit | Attending: Family Medicine | Admitting: Family Medicine

## 2016-07-22 DIAGNOSIS — R05 Cough: Secondary | ICD-10-CM | POA: Insufficient documentation

## 2016-07-27 ENCOUNTER — Telehealth: Payer: Self-pay | Admitting: Family Medicine

## 2016-07-27 NOTE — Telephone Encounter (Signed)
Patient's wife advised-aa 

## 2016-07-27 NOTE — Telephone Encounter (Signed)
Pt wife Clydie BraunKaren called to request results for chest x-ray.  CB#[phone] or [phone]

## 2016-08-03 ENCOUNTER — Other Ambulatory Visit: Payer: Self-pay

## 2016-08-03 MED ORDER — ALPRAZOLAM 0.5 MG PO TABS: 0.5000 mg | ORAL_TABLET | Freq: Two times a day (BID) | ORAL | 5 refills | Status: DC | PRN

## 2016-08-15 ENCOUNTER — Other Ambulatory Visit: Payer: Self-pay | Admitting: Family Medicine

## 2016-08-15 DIAGNOSIS — I10 Essential (primary) hypertension: Secondary | ICD-10-CM

## 2016-09-08 ENCOUNTER — Encounter: Payer: Self-pay | Admitting: Physician Assistant

## 2016-09-08 ENCOUNTER — Ambulatory Visit (INDEPENDENT_AMBULATORY_CARE_PROVIDER_SITE_OTHER): Payer: Medicare Other | Admitting: Physician Assistant

## 2016-09-08 VITALS — BP 116/68 | HR 56 | Temp 97.6°F | Resp 16 | Wt 229.0 lb

## 2016-09-08 DIAGNOSIS — R05 Cough: Secondary | ICD-10-CM | POA: Diagnosis not present

## 2016-09-08 DIAGNOSIS — R059 Cough, unspecified: Secondary | ICD-10-CM

## 2016-09-08 DIAGNOSIS — J069 Acute upper respiratory infection, unspecified: Secondary | ICD-10-CM | POA: Diagnosis not present

## 2016-09-08 MED ORDER — HYDROCODONE-HOMATROPINE 5-1.5 MG/5ML PO SYRP
5.0000 mL | ORAL_SOLUTION | Freq: Four times a day (QID) | ORAL | 0 refills | Status: DC | PRN
Start: 2016-09-08 — End: 2016-10-15

## 2016-09-08 NOTE — Progress Notes (Signed)
Patient: Christian [name] Male    DOB: 1951/01/20   65 y.o.   MRN: [id number] Visit Date: 09/08/2016  Today's Provider: Trey Sailors, PA-C   Chief Complaint  Patient presents with  . URI    Started about 3-4 days ago.    Subjective:    URI   This is a new problem. The current episode started in the past 7 days. The problem has been unchanged. There has been no fever. Associated symptoms include congestion, coughing (Worse at night.  Pt says he is not able to sleep at night. ), rhinorrhea and sneezing. Pertinent negatives include no ear pain, headaches, nausea, neck pain, sore throat or wheezing. He has tried acetaminophen (Pt has taken his wife's tussinex and that helps him sleep at night. ) for the symptoms.   Patient has been having symptoms for past 4 days. Patient reports his wife has had same symptoms and he caught it from her. His cough keeps him up at night and from sleeping. No facial pain, ear pain or pressure. Does have some PND. No fever, N/V, myalgias.    No Known Allergies   Current Outpatient Prescriptions:  .  ALPRAZolam (XANAX) 0.5 MG tablet, Take 1 tablet (0.5 mg total) by mouth 2 (two) times daily as needed for anxiety., Disp: 60 tablet, Rfl: 5 .  aspirin 81 MG tablet, Take by mouth., Disp: , Rfl:  .  atorvastatin (LIPITOR) 40 MG tablet, Take 1 tablet by mouth at  bedtime, Disp: 90 tablet, Rfl: 3 .  cholecalciferol (VITAMIN D) 1000 UNITS tablet, Take by mouth., Disp: , Rfl:  .  MULTIPLE VITAMINS PO, Take by mouth., Disp: , Rfl:  .  naproxen sodium (ALEVE) 220 MG tablet, Take 1 tablet (220 mg total) by mouth 2 (two) times daily with a meal., Disp: 60 tablet, Rfl: 0 .  Olmesartan-Amlodipine-HCTZ 40-10-25 MG TABS, TAKE 1 TABLET BY MOUTH DAILY, Disp: 90 tablet, Rfl: 3 .  HYDROcodone-homatropine (HYCODAN) 5-1.5 MG/5ML syrup, Take 5 mLs by mouth every 6 (six) hours as needed for cough., Disp: 120 mL, Rfl: 0  Review of Systems  Constitutional: Positive for  fatigue. Negative for activity change, appetite change, chills, diaphoresis, fever and unexpected weight change.  HENT: Positive for congestion, postnasal drip, rhinorrhea, sinus pressure and sneezing. Negative for ear discharge, ear pain, nosebleeds, sore throat, tinnitus, trouble swallowing and voice change.   Eyes: Negative.  Negative for photophobia, pain, discharge, redness, itching and visual disturbance.  Respiratory: Positive for cough (Worse at night.  Pt says he is not able to sleep at night. ) and chest tightness. Negative for apnea, choking, shortness of breath, wheezing and stridor.   Cardiovascular: Negative.   Gastrointestinal: Negative.  Negative for nausea.  Musculoskeletal: Negative.  Negative for myalgias and neck pain.  Neurological: Negative for dizziness, light-headedness and headaches.    Social History  Substance Use Topics  . Smoking status: Never Smoker  . Smokeless tobacco: Not on file  . Alcohol use Yes     Comment: 12-14 Drinks a week.   Objective:   BP 116/68 (BP Location: Left Arm, Patient Position: Sitting, Cuff Size: Large)   Pulse (!) 56   Temp 97.6 F (36.4 C) (Oral)   Resp 16   Wt 229 lb (103.9 kg)   BMI 31.94 kg/m   Physical Exam  Constitutional: He is oriented to person, place, and time. He appears well-developed and well-nourished. No distress.  HENT:  Right Ear:  Tympanic membrane and external ear normal.  Left Ear: Tympanic membrane and external ear normal.  Nose: Rhinorrhea present. Right sinus exhibits no maxillary sinus tenderness and no frontal sinus tenderness. Left sinus exhibits no maxillary sinus tenderness and no frontal sinus tenderness.  Mouth/Throat: Uvula is midline, oropharynx is clear and moist and mucous membranes are normal. No oropharyngeal exudate.  Clear rhinorrhea  Eyes: Conjunctivae are normal. Right eye exhibits discharge. Left eye exhibits discharge.  Watery Discharge   Neck: Normal range of motion. Neck supple.    Cardiovascular: Normal rate and regular rhythm.   Pulmonary/Chest: Effort normal and breath sounds normal. No respiratory distress. He has no wheezes. He has no rales.  Lymphadenopathy:    He has no cervical adenopathy.  Neurological: He is alert and oriented to person, place, and time.  Skin: Skin is warm and dry. He is not diaphoretic.  Psychiatric: He has a normal mood and affect. His behavior is normal.  Vitals reviewed.       Assessment & Plan:      Problem List Items Addressed This Visit    None    Visit Diagnoses    Upper respiratory tract infection, unspecified type    -  Primary   Cough       Relevant Medications   HYDROcodone-homatropine (HYCODAN) 5-1.5 MG/5ML syrup     Patient is 65 y/o man presenting with URI, likely viral at this point, especially considering sick contact. Will treat symptomatically with cough syrup and Coricidin 2/2 high blood pressure. Patient may call back if not feeling better.  Return if symptoms worsen or fail to improve.  The entirety of the information documented in the History of Present Illness, Review of Systems and Physical Exam were personally obtained by me. Portions of this information were initially documented by Kavin Leech, CMA and reviewed by me for thoroughness and accuracy.    Patient Instructions  Coricidin, Hycodan for symptoms  Upper Respiratory Infection, Adult Most upper respiratory infections (URIs) are a viral infection of the air passages leading to the lungs. A URI affects the nose, throat, and upper air passages. The most common type of URI is nasopharyngitis and is typically referred to as "the common cold." URIs run their course and usually go away on their own. Most of the time, a URI does not require medical attention, but sometimes a bacterial infection in the upper airways can follow a viral infection. This is called a secondary infection. Sinus and middle ear infections are common types of secondary upper  respiratory infections. Bacterial pneumonia can also complicate a URI. A URI can worsen asthma and chronic obstructive pulmonary disease (COPD). Sometimes, these complications can require emergency medical care and may be life threatening.  CAUSES Almost all URIs are caused by viruses. A virus is a type of germ and can spread from one person to another.  RISKS FACTORS You may be at risk for a URI if:   You smoke.   You have chronic heart or lung disease.  You have a weakened defense (immune) system.   You are very young or very old.   You have nasal allergies or asthma.  You work in crowded or poorly ventilated areas.  You work in health care facilities or schools. SIGNS AND SYMPTOMS  Symptoms typically develop 2-3 days after you come in contact with a cold virus. Most viral URIs last 7-10 days. However, viral URIs from the influenza virus (flu virus) can last 14-18 days and are typically  more severe. Symptoms may include:   Runny or stuffy (congested) nose.   Sneezing.   Cough.   Sore throat.   Headache.   Fatigue.   Fever.   Loss of appetite.   Pain in your forehead, behind your eyes, and over your cheekbones (sinus pain).  Muscle aches.  DIAGNOSIS  Your health care provider may diagnose a URI by:  Physical exam.  Tests to check that your symptoms are not due to another condition such as:  Strep throat.  Sinusitis.  Pneumonia.  Asthma. TREATMENT  A URI goes away on its own with time. It cannot be cured with medicines, but medicines may be prescribed or recommended to relieve symptoms. Medicines may help:  Reduce your fever.  Reduce your cough.  Relieve nasal congestion. HOME CARE INSTRUCTIONS   Take medicines only as directed by your health care provider.   Gargle warm saltwater or take cough drops to comfort your throat as directed by your health care provider.  Use a warm mist humidifier or inhale steam from a shower to increase  air moisture. This may make it easier to breathe.  Drink enough fluid to keep your urine clear or pale yellow.   Eat soups and other clear broths and maintain good nutrition.   Rest as needed.   Return to work when your temperature has returned to normal or as your health care provider advises. You may need to stay home longer to avoid infecting others. You can also use a face mask and careful hand washing to prevent spread of the virus.  Increase the usage of your inhaler if you have asthma.   Do not use any tobacco products, including cigarettes, chewing tobacco, or electronic cigarettes. If you need help quitting, ask your health care provider. PREVENTION  The best way to protect yourself from getting a cold is to practice good hygiene.   Avoid oral or hand contact with people with cold symptoms.   Wash your hands often if contact occurs.  There is no clear evidence that vitamin C, vitamin E, echinacea, or exercise reduces the chance of developing a cold. However, it is always recommended to get plenty of rest, exercise, and practice good nutrition.  SEEK MEDICAL CARE IF:   You are getting worse rather than better.   Your symptoms are not controlled by medicine.   You have chills.  You have worsening shortness of breath.  You have brown or red mucus.  You have yellow or brown nasal discharge.  You have pain in your face, especially when you bend forward.  You have a fever.  You have swollen neck glands.  You have pain while swallowing.  You have white areas in the back of your throat. SEEK IMMEDIATE MEDICAL CARE IF:   You have severe or persistent:  Headache.  Ear pain.  Sinus pain.  Chest pain.  You have chronic lung disease and any of the following:  Wheezing.  Prolonged cough.  Coughing up blood.  A change in your usual mucus.  You have a stiff neck.  You have changes in your:  Vision.  Hearing.  Thinking.  Mood. MAKE SURE YOU:    Understand these instructions.  Will watch your condition.  Will get help right away if you are not doing well or get worse.   This information is not intended to replace advice given to you by your health care provider. Make sure you discuss any questions you have with your health care provider.  Document Released: 04/21/2001 Document Revised: 03/12/2015 Document Reviewed: 01/31/2014 Elsevier Interactive Patient Education 2016 [employer]           Trey SailorsAdriana M Pollak, PA-C  [facility]

## 2016-09-08 NOTE — Patient Instructions (Addendum)
Coricidin, Hycodan for symptoms  Upper Respiratory Infection, Adult Most upper respiratory infections (URIs) are a viral infection of the air passages leading to the lungs. A URI affects the nose, throat, and upper air passages. The most common type of URI is nasopharyngitis and is typically referred to as "the common cold." URIs run their course and usually go away on their own. Most of the time, a URI does not require medical attention, but sometimes a bacterial infection in the upper airways can follow a viral infection. This is called a secondary infection. Sinus and middle ear infections are common types of secondary upper respiratory infections. Bacterial pneumonia can also complicate a URI. A URI can worsen asthma and chronic obstructive pulmonary disease (COPD). Sometimes, these complications can require emergency medical care and may be life threatening.  CAUSES Almost all URIs are caused by viruses. A virus is a type of germ and can spread from one person to another.  RISKS FACTORS You may be at risk for a URI if:   You smoke.   You have chronic heart or lung disease.  You have a weakened defense (immune) system.   You are very young or very old.   You have nasal allergies or asthma.  You work in crowded or poorly ventilated areas.  You work in health care facilities or schools. SIGNS AND SYMPTOMS  Symptoms typically develop 2-3 days after you come in contact with a cold virus. Most viral URIs last 7-10 days. However, viral URIs from the influenza virus (flu virus) can last 14-18 days and are typically more severe. Symptoms may include:   Runny or stuffy (congested) nose.   Sneezing.   Cough.   Sore throat.   Headache.   Fatigue.   Fever.   Loss of appetite.   Pain in your forehead, behind your eyes, and over your cheekbones (sinus pain).  Muscle aches.  DIAGNOSIS  Your health care provider may diagnose a URI by:  Physical exam.  Tests to check  that your symptoms are not due to another condition such as:  Strep throat.  Sinusitis.  Pneumonia.  Asthma. TREATMENT  A URI goes away on its own with time. It cannot be cured with medicines, but medicines may be prescribed or recommended to relieve symptoms. Medicines may help:  Reduce your fever.  Reduce your cough.  Relieve nasal congestion. HOME CARE INSTRUCTIONS   Take medicines only as directed by your health care provider.   Gargle warm saltwater or take cough drops to comfort your throat as directed by your health care provider.  Use a warm mist humidifier or inhale steam from a shower to increase air moisture. This may make it easier to breathe.  Drink enough fluid to keep your urine clear or pale yellow.   Eat soups and other clear broths and maintain good nutrition.   Rest as needed.   Return to work when your temperature has returned to normal or as your health care provider advises. You may need to stay home longer to avoid infecting others. You can also use a face mask and careful hand washing to prevent spread of the virus.  Increase the usage of your inhaler if you have asthma.   Do not use any tobacco products, including cigarettes, chewing tobacco, or electronic cigarettes. If you need help quitting, ask your health care provider. PREVENTION  The best way to protect yourself from getting a cold is to practice good hygiene.   Avoid oral or hand  contact with people with cold symptoms.   Wash your hands often if contact occurs.  There is no clear evidence that vitamin C, vitamin E, echinacea, or exercise reduces the chance of developing a cold. However, it is always recommended to get plenty of rest, exercise, and practice good nutrition.  SEEK MEDICAL CARE IF:   You are getting worse rather than better.   Your symptoms are not controlled by medicine.   You have chills.  You have worsening shortness of breath.  You have brown or red  mucus.  You have yellow or brown nasal discharge.  You have pain in your face, especially when you bend forward.  You have a fever.  You have swollen neck glands.  You have pain while swallowing.  You have white areas in the back of your throat. SEEK IMMEDIATE MEDICAL CARE IF:   You have severe or persistent:  Headache.  Ear pain.  Sinus pain.  Chest pain.  You have chronic lung disease and any of the following:  Wheezing.  Prolonged cough.  Coughing up blood.  A change in your usual mucus.  You have a stiff neck.  You have changes in your:  Vision.  Hearing.  Thinking.  Mood. MAKE SURE YOU:   Understand these instructions.  Will watch your condition.  Will get help right away if you are not doing well or get worse.   This information is not intended to replace advice given to you by your health care provider. Make sure you discuss any questions you have with your health care provider.   Document Released: 04/21/2001 Document Revised: 03/12/2015 Document Reviewed: 01/31/2014 Elsevier Interactive Patient Education [employer].

## 2016-09-22 ENCOUNTER — Telehealth: Payer: Self-pay | Admitting: Family Medicine

## 2016-09-22 ENCOUNTER — Ambulatory Visit: Payer: Medicare Other | Admitting: Family Medicine

## 2016-09-22 NOTE — Telephone Encounter (Signed)
Pt wife wants him to come in tomorrow at 9:45. Appt made.

## 2016-09-22 NOTE — Telephone Encounter (Signed)
Pt's wife called saying her husband came in a couple weeks ago and seen Adraina.  She gave him something for a cough.  He still has a bad cough.  Last night he passed out on the kitchen floor.  Wife called EMS.  He woke up and came to.  He did not want to go to the hospital. EMS did not come.  He told his wife he was ok.  She wants him to see Dr. Sullivan LoneGilbert as soon as he can.  He still has a bad cough.  He went to work today and says he feels ok.  Wife is concerned about his spell last night.  She only wants him to see Dr. Sullivan LoneGilbert/  Call back is [phone] and [phone].  Thank sTeri

## 2016-09-22 NOTE — Telephone Encounter (Signed)
945 tomorrow or thursday

## 2016-09-22 NOTE — Telephone Encounter (Signed)
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

## 2016-09-23 ENCOUNTER — Ambulatory Visit (INDEPENDENT_AMBULATORY_CARE_PROVIDER_SITE_OTHER): Payer: Medicare Other | Admitting: Family Medicine

## 2016-09-23 ENCOUNTER — Encounter: Payer: Self-pay | Admitting: Family Medicine

## 2016-09-23 ENCOUNTER — Ambulatory Visit: Payer: Medicare Other

## 2016-09-23 VITALS — BP 134/78 | HR 52 | Temp 98.0°F | Resp 16 | Wt 233.0 lb

## 2016-09-23 DIAGNOSIS — R55 Syncope and collapse: Secondary | ICD-10-CM

## 2016-09-23 DIAGNOSIS — R059 Cough, unspecified: Secondary | ICD-10-CM

## 2016-09-23 DIAGNOSIS — R05 Cough: Secondary | ICD-10-CM | POA: Diagnosis not present

## 2016-09-23 DIAGNOSIS — J42 Unspecified chronic bronchitis: Secondary | ICD-10-CM

## 2016-09-23 MED ORDER — HYDROCOD POLST-CPM POLST ER 10-8 MG/5ML PO SUER: 5.0000 mL | Freq: Two times a day (BID) | ORAL | 0 refills | Status: DC | PRN

## 2016-09-23 MED ORDER — DOXYCYCLINE HYCLATE 100 MG PO TABS: 100.0000 mg | ORAL_TABLET | Freq: Two times a day (BID) | ORAL | 0 refills | Status: DC

## 2016-09-23 NOTE — Progress Notes (Signed)
Subjective:  HPI Pt is here today for a cough and syncope. He reports that he has been coughing for about 3 weeks now but 3 days ago he was in the kitchen and starting coughing then passed out. Wife called EMS but pt had became conscience again and refused to go to the ER. Pt reports that he had felt ok other than the cough since the episode No dizziness or lightheadedness. He reports that he has chest pain but feels like it muscular from coughing. He reports that he feels like the cold is in his chest.  He denies any shortness of breath, numbness, palpations, or fever, chills. The cough is keeping him up at night. His cough is productive and white/yellow in color. He reports that he has still been working and doing daily activities.  He does report that he has had a headache but has had them on and off since before his syncope episode. He reports that his head feels full. He does not remember if he his his head when he fell. Pt was seen by Adriana on 09/08/16 and she treated him as a URI and cough syrup but did not given an antibiotic.   Prior to Admission medications   Medication Sig Start Date End Date Taking? Authorizing Provider  ALPRAZolam Prudy Feeler(XANAX) 0.5 MG tablet Take 1 tablet (0.5 mg total) by mouth 2 (two) times daily as needed for anxiety. 08/03/16   Maple Hudsonichard L Gilbert Jr., MD  aspirin 81 MG tablet Take by mouth. 02/11/12   Historical Provider, MD  atorvastatin (LIPITOR) 40 MG tablet Take 1 tablet by mouth at  bedtime 07/10/16   Maple Hudsonichard L Gilbert Jr., MD  cholecalciferol (VITAMIN D) 1000 UNITS tablet Take by mouth. 02/11/12   Historical Provider, MD  HYDROcodone-homatropine (HYCODAN) 5-1.5 MG/5ML syrup Take 5 mLs by mouth every 6 (six) hours as needed for cough. 09/08/16   Trey SailorsAdriana M Pollak, PA-C  MULTIPLE VITAMINS PO Take by mouth. 02/11/12   Historical Provider, MD  naproxen sodium (ALEVE) 220 MG tablet Take 1 tablet (220 mg total) by mouth 2 (two) times daily with a meal. 01/07/16   Maple Hudsonichard L Gilbert  Jr., MD  Olmesartan-Amlodipine-HCTZ 40-10-25 MG TABS TAKE 1 TABLET BY MOUTH DAILY 08/17/16   Maple Hudsonichard L Gilbert Jr., MD    Patient Active Problem List   Diagnosis Date Noted  . Hypercalcemia 07/01/2015  . Alcohol drinker 05/08/2015  . Anxiety 05/08/2015  . Depression, major, single episode, mild (HCC) 05/08/2015  . Acid reflux 05/08/2015  . Difficulty hearing 05/08/2015  . Blood glucose elevated 05/08/2015  . HLD (hyperlipidemia) 05/08/2015  . BP (high blood pressure) 05/08/2015  . Cannot sleep 05/08/2015  . Osteoarthrosis 05/08/2015  . Avitaminosis D 05/08/2015    History reviewed. No pertinent past medical history.  Social History   Social History  . Marital status: Married    Spouse name: N/A  . Number of children: N/A  . Years of education: N/A   Occupational History  . Not on file.   Social History Main Topics  . Smoking status: Never Smoker  . Smokeless tobacco: Never Used  . Alcohol use Yes     Comment: 12-14 Drinks a week.  . Drug use: No  . Sexual activity: Not on file   Other Topics Concern  . Not on file   Social History Narrative  . No narrative on file    No Known Allergies  Review of Systems  Constitutional: Negative.   HENT: Positive  for congestion.   Eyes: Negative.   Respiratory: Positive for cough and sputum production.   Cardiovascular: Negative.   Gastrointestinal: Negative.   Genitourinary: Negative.   Musculoskeletal: Positive for myalgias (from coughing).  Skin: Negative.   Neurological: Positive for loss of consciousness and headaches.  Endo/Heme/Allergies: Negative.   Psychiatric/Behavioral: Negative.     Immunization History  Administered Date(s) Administered  . Influenza, High Dose Seasonal PF 09/09/2015, 07/14/2016  . Pneumococcal Conjugate-13 01/01/2015  . Pneumococcal Polysaccharide-23 01/07/2016  . Tdap 07/29/2011  . Zoster 09/07/2013    Objective:  BP 134/78 (BP Location: Left Arm, Patient Position: Sitting, Cuff  Size: Normal)   Pulse (!) 52   Temp 98 F (36.7 C) (Oral)   Resp 16   Wt 233 lb (105.7 kg)   SpO2 99%   BMI 32.50 kg/m   Physical Exam  Constitutional: He is oriented to person, place, and time and well-developed, well-nourished, and in no distress.  HENT:  Head: Normocephalic and atraumatic.  Right Ear: External ear normal.  Left Ear: External ear normal.  Nose: Nose normal.  Mouth/Throat: Oropharynx is clear and moist.  Eyes: Conjunctivae and EOM are normal. Pupils are equal, round, and reactive to light.  Neck: Normal range of motion. Neck supple.  Cardiovascular: Normal rate, regular rhythm, normal heart sounds and intact distal pulses.   Pulmonary/Chest: Effort normal.  rhonchi bilaterally in bases.  Abdominal: Soft.  Musculoskeletal: Normal range of motion.  Neurological: He is alert and oriented to person, place, and time. He has normal reflexes. Gait normal. GCS score is 15.  Skin: Skin is warm and dry.  Psychiatric: Mood, memory, affect and judgment normal.    Lab Results  Component Value Date   WBC 6.3 07/14/2016   HGB 15.7 09/07/2013   HCT 45.6 07/14/2016   PLT 207 07/14/2016   GLUCOSE 125 (H) 07/14/2016   CHOL 132 07/14/2016   TRIG 100 07/14/2016   HDL 43 07/14/2016   LDLCALC 69 07/14/2016   TSH 1.080 07/14/2016   PSA 1.3 09/07/2013   HGBA1C 5.7 07/14/2016    CMP     Component Value Date/Time   NA 139 07/14/2016 0850   K 4.9 07/14/2016 0850   CL 99 07/14/2016 0850   CO2 25 07/14/2016 0850   GLUCOSE 125 (H) 07/14/2016 0850   BUN 24 07/14/2016 0850   CREATININE 1.10 07/14/2016 0850   CALCIUM 10.0 07/14/2016 0850   PROT 7.4 07/14/2016 0850   ALBUMIN 4.7 07/14/2016 0850   AST 24 07/14/2016 0850   ALT 40 07/14/2016 0850   ALKPHOS 75 07/14/2016 0850   BILITOT 0.6 07/14/2016 0850   GFRNONAA 69 07/14/2016 0850   GFRAA 80 07/14/2016 0850    Assessment and Plan :  1. Syncope, unspecified syncope type He is bradycardic but is been that way since  he was a young man. He remembers that they mentioned  the bradycardia at the time of his entrance exam to the [employer]. - EKG 12-Lead - Ambulatory referral to Cardiology  2. Chronic bronchitis, unspecified chronic bronchitis type (HCC) Go ahead and treat this as he is 4 weeks into the cough now. No x-ray needed. - doxycycline (VIBRA-TABS) 100 MG tablet; Take 1 tablet (100 mg total) by mouth 2 (two) times daily.  Dispense: 10 tablet; Refill: 0  3. Vasovagal syncope Most likely etiology of the syncope.  4. Cough  - chlorpheniramine-HYDROcodone (TUSSIONEX PENNKINETIC ER) 10-8 MG/5ML SUER; Take 5 mLs by mouth every 12 (twelve) hours as  needed for cough.  Dispense: 140 mL; Refill: 0   HPI, Exam, and A&P Transcribed under the direction and in the presence of Richard L. Wendelyn BreslowGilbert Jr, MD  Electronically Signed: Dimas ChyleBrittany Byrd, CMA  Julieanne Mansonichard Gilbert MD [facility] 09/23/2016 9:50 AM

## 2016-09-23 NOTE — Patient Instructions (Signed)
Vasovagal Syncope, Adult Syncope, which is commonly known as fainting or passing out, is a temporary loss of consciousness. It occurs when the blood flow to the brain is reduced. Vasovagal syncope, also called neurocardiogenic syncope, is a fainting spell that happens when blood flow to the brain is reduced because of a sudden drop in heart rate and blood pressure. Vasovagal syncope is usually harmless. However, you can get injured if you fall during a fainting spell. What are the causes? This condition is caused by a drop in heart rate and blood pressure, usually in response to a trigger. Many things and situations can trigger an episode, including:  Pain.  Fear.  The sight of blood. This may occur during medical procedures, such as when blood is being drawn from a vein.  Common activities, such as coughing, swallowing, stretching, or going to the bathroom.  Emotional stress.  Being in a confined space.  Prolonged standing, especially in a warm environment.  Lack of sleep or rest.  Not eating for a long time.  Not drinking enough liquids.  Recent illness.  Drinking alcohol.  Taking drugs that affect blood pressure, such as marijuana, cocaine, opiates, or inhalants. What are the signs or symptoms? Before a fainting episode, you may:  Feel dizzy or light-headed.  Become pale.  Sense that you are going to faint.  Feel like the room is spinning.  Only see directly ahead (tunnel vision).  Feel sick to your stomach (nauseous).  See spots.  Slowly lose vision.  Hear ringing in your ears.  Have a headache.  Feel warm and sweaty.  Feel a sensation of pins and needles. During the fainting spell, you may twitch or make jerky movements. Fainting spells usually last no longer than a few minutes before you wake up. If you get up too quickly before your body can recover, you may faint again. How is this diagnosed? This condition is diagnosed based on your symptoms, your  medical history, and a physical exam. Tests may be done to rule out other causes of fainting. Tests may include:  Blood tests.  Heart tests, such as an electrocardiogram (ECG), echocardiogram, or electrophysiology study.  A test to check your response to changes in position (tilt table test). How is this treated? Usually, treatment is not needed for this condition. Your health care provider may suggest ways to help prevent fainting episodes. These may include:  Drinking additional fluids if you are exposed to a trigger.  Sitting or lying down if you notice signs that an episode is coming. If your fainting spells continue, your health care provider may recommend that you:  Take medicines to prevent fainting or to help reduce further episodes of fainting.  Do certain exercises.  Wear compression stockings.  Have surgery to place a pacemaker in your body (rare). Follow these instructions at home:  Learn to identify the signs that an episode is coming.  Sit or lie down at the first sign of a fainting spell. If you sit down, put your head down between your legs. If you lie down, swing your legs up in the air to increase blood flow to the brain.  Avoid hot tubs and saunas.  Avoid standing for a long time. If you have to stand for a long time, try:  Crossing your legs.  Flexing and stretching your leg muscles.  Squatting.  Moving your legs.  Bending over.  Drink enough fluid to keep your urine clear or pale yellow.  Make changes to   your diet that your health care provider recommends. You may be told to:  Avoid caffeine.  Eat more salt.  Take over-the-counter and prescription medicines only as told by your health care provider. Contact a health care provider if:  You continue to have fainting spells despite treatment.  You faint more often despite treatment.  You lose consciousness for more than a few minutes.  You faint during or after exercising or after being  startled.  You have twitching or jerky movements for longer than a few seconds during a fainting spell.  You have an episode of twitching or jerky movements without fainting. Get help right away if:  A fainting spell leads to an injury or bleeding.  You have new symptoms that occur with the fainting spells, such as:  Shortness of breath.  Chest pain.  Irregular heartbeat.  You twitch or make jerky movements for more than 5 minutes.  You twitch or make jerky movements during more than one fainting spell. This information is not intended to replace advice given to you by your health care provider. Make sure you discuss any questions you have with your health care provider. Document Released: 10/12/2012 Document Revised: 04/08/2016 Document Reviewed: 08/24/2015 Elsevier Interactive Patient Education  2017 Elsevier Inc.  

## 2016-10-15 ENCOUNTER — Ambulatory Visit
Admission: RE | Admit: 2016-10-15 | Discharge: 2016-10-15 | Disposition: A | Discharge status: Home / Self Care | Payer: Medicare Other | Source: Ambulatory Visit | Attending: Family Medicine | Admitting: Family Medicine

## 2016-10-15 ENCOUNTER — Ambulatory Visit (INDEPENDENT_AMBULATORY_CARE_PROVIDER_SITE_OTHER): Payer: Medicare Other | Admitting: Family Medicine

## 2016-10-15 VITALS — BP 108/60 | HR 60 | Temp 97.9°F | Resp 16 | Wt 235.0 lb

## 2016-10-15 DIAGNOSIS — J42 Unspecified chronic bronchitis: Secondary | ICD-10-CM

## 2016-10-15 DIAGNOSIS — R05 Cough: Secondary | ICD-10-CM | POA: Diagnosis not present

## 2016-10-15 DIAGNOSIS — R062 Wheezing: Secondary | ICD-10-CM | POA: Diagnosis not present

## 2016-10-15 MED ORDER — PREDNISONE 10 MG PO TABS: 10.0000 mg | ORAL_TABLET | ORAL | 0 refills | Status: DC

## 2016-10-15 MED ORDER — AZITHROMYCIN 250 MG PO TABS: ORAL_TABLET | ORAL | 0 refills | Status: DC

## 2016-10-15 NOTE — Progress Notes (Signed)
Christian FleetingStephen L [name]  MRN: [id number] DOB: [birth year]  Subjective:  HPI   The patient is a 65 year old male who presents for persistent cough.  The patient states he has been sick since the beginning of September.  He said his head congestion is clear but he continues to have cough.  This is his third visit for this problem.  The patient was last seen on 09/23/16 and was treated with antibiotics.  He thinks this is when his head congestion finally cleared.    The patient states that the cough is keeping him up at night as well as everyone in the house.  He is having to sleep in his recliner due to the cough.  He denies any fever and said the cough is somewhat productive of clear sputum.  He complains that when he coughs it hurts his chest.  He does not have pain if he is not coughing.  He states that he has at times coughed so hard he would get dizzy.  He also noticed that if in a hot room or walks out in the cold air it will trigger the coughing.  Patient Active Problem List   Diagnosis Date Noted  . Hypercalcemia 07/01/2015  . Alcohol drinker 05/08/2015  . Anxiety 05/08/2015  . Depression, major, single episode, mild (HCC) 05/08/2015  . Acid reflux 05/08/2015  . Difficulty hearing 05/08/2015  . Blood glucose elevated 05/08/2015  . HLD (hyperlipidemia) 05/08/2015  . BP (high blood pressure) 05/08/2015  . Cannot sleep 05/08/2015  . Osteoarthrosis 05/08/2015  . Avitaminosis D 05/08/2015    No past medical history on file.  Social History   Social History  . Marital status: Married    Spouse name: N/A  . Number of children: N/A  . Years of education: N/A   Occupational History  . Not on file.   Social History Main Topics  . Smoking status: Never Smoker  . Smokeless tobacco: Never Used  . Alcohol use Yes     Comment: 12-14 Drinks a week.  . Drug use: No  . Sexual activity: Not on file   Other Topics Concern  . Not on file   Social History Narrative  . No narrative  on file    Outpatient Encounter Prescriptions as of 10/15/2016  Medication Sig Note  . ALPRAZolam (XANAX) 0.5 MG tablet Take 1 tablet (0.5 mg total) by mouth 2 (two) times daily as needed for anxiety.   Marland Kitchen. aspirin 81 MG tablet Take by mouth. 05/08/2015: Received from: [facility]  . atorvastatin (LIPITOR) 40 MG tablet Take 1 tablet by mouth at  bedtime   . chlorpheniramine-HYDROcodone (TUSSIONEX PENNKINETIC ER) 10-8 MG/5ML SUER Take 5 mLs by mouth every 12 (twelve) hours as needed for cough.   . cholecalciferol (VITAMIN D) 1000 UNITS tablet Take by mouth. 05/08/2015: Received from: [facility]  . glucosamine-chondroitin 500-400 MG tablet Take 1 tablet by mouth 3 (three) times daily.   . MULTIPLE VITAMINS PO Take by mouth. 05/08/2015: Received from: [facility]  . naproxen sodium (ALEVE) 220 MG tablet Take 1 tablet (220 mg total) by mouth 2 (two) times daily with a meal.   . Olmesartan-Amlodipine-HCTZ 40-10-25 MG TABS TAKE 1 TABLET BY MOUTH DAILY   . [DISCONTINUED] doxycycline (VIBRA-TABS) 100 MG tablet Take 1 tablet (100 mg total) by mouth 2 (two) times daily.   . [DISCONTINUED] HYDROcodone-homatropine (HYCODAN) 5-1.5 MG/5ML syrup Take 5 mLs by mouth every 6 (six) hours as needed  for cough.    No facility-administered encounter medications on file as of 10/15/2016.     No Known Allergies  Review of Systems  Constitutional: Positive for malaise/fatigue. Negative for chills, diaphoresis and fever.  HENT: Positive for congestion (chest not head). Negative for ear discharge, ear pain, hearing loss, nosebleeds, sinus pain, sore throat and tinnitus.   Eyes: Negative for blurred vision, double vision, photophobia, pain, discharge and redness.  Respiratory: Positive for cough, sputum production (clear), shortness of breath (only after coughing hard or extreme exertion) and wheezing. Negative for hemoptysis.   Cardiovascular: Positive for orthopnea.  Negative for chest pain (chest osreness from cough.  Patient states he was putting in a transmission and thinks he pulled a muscle and reports that advil does make it better.), palpitations and leg swelling.  Neurological: Positive for weakness.    Objective:  BP 108/60 (BP Location: Right Arm, Patient Position: Sitting, Cuff Size: Normal)   Pulse 60   Temp 97.9 F (36.6 C) (Oral)   Resp 16   Wt 235 lb (106.6 kg)   BMI 32.78 kg/m   Physical Exam  Constitutional: He is oriented to person, place, and time and well-developed, well-nourished, and in no distress.  HENT:  Head: Normocephalic and atraumatic.  Right Ear: External ear normal.  Left Ear: External ear normal.  Nose: Nose normal.  Cardiovascular: Normal rate, regular rhythm and normal heart sounds.   Pulmonary/Chest: Effort normal and breath sounds normal.  Abdominal: Soft.  Neurological: He is alert and oriented to person, place, and time. Gait normal.  Skin: Skin is warm and dry.  Psychiatric: Mood, memory, affect and judgment normal.    Assessment and Plan :  Chronic Bronchitis ObtainCXR and treat with Zpak and 6 day prednisone taper. Pt clinically is ok but has had about 2 months of cough. Cough Depression HTN

## 2016-10-28 ENCOUNTER — Ambulatory Visit (INDEPENDENT_AMBULATORY_CARE_PROVIDER_SITE_OTHER): Payer: Medicare Other | Admitting: Family Medicine

## 2016-10-28 VITALS — BP 112/70 | HR 72 | Temp 97.9°F | Resp 16 | Wt 236.0 lb

## 2016-10-28 DIAGNOSIS — J42 Unspecified chronic bronchitis: Secondary | ICD-10-CM | POA: Diagnosis not present

## 2016-10-28 MED ORDER — PREDNISONE 10 MG (48) PO TBPK: ORAL_TABLET | Freq: Every day | ORAL | 0 refills | Status: DC

## 2016-10-28 MED ORDER — HYDROCOD POLST-CPM POLST ER 10-8 MG/5ML PO SUER: 5.0000 mL | Freq: Two times a day (BID) | ORAL | Status: DC

## 2016-10-28 MED ORDER — AMOXICILLIN-POT CLAVULANATE 875-125 MG PO TABS: 1.0000 | ORAL_TABLET | Freq: Two times a day (BID) | ORAL | 0 refills | Status: DC

## 2016-10-28 NOTE — Progress Notes (Signed)
Christian FleetingStephen L Lackey  MRN: [id number] DOB: [birth year]  Subjective:  HPI   The patient is a 65 year old male who presents for persistent cough.  He has been seen for this 3 times already over the last 3 months.  He continues to have cough despite being treated with Hycodan, and Tussinex once each, antibiotics; Doxycycline and ZPak, and Prednisone 6 day taper.  The patient was also placed on an inhaler on his last visit.  He had CXR on 07/22/16 and one on 10/15/16 both of which were negative.  The patient continues to have bad cough, where he is unable to sleep in the bed he has to sleep in the recliner.  He states he coughs up clear sputum, some of which is so thick it is formed.  He does have more head congestion now which is also clear but he is blowing some out and also have runny nose.  Patient Active Problem List   Diagnosis Date Noted  . Hypercalcemia 07/01/2015  . Alcohol drinker 05/08/2015  . Anxiety 05/08/2015  . Depression, major, single episode, mild (HCC) 05/08/2015  . Acid reflux 05/08/2015  . Difficulty hearing 05/08/2015  . Blood glucose elevated 05/08/2015  . HLD (hyperlipidemia) 05/08/2015  . BP (high blood pressure) 05/08/2015  . Cannot sleep 05/08/2015  . Osteoarthrosis 05/08/2015  . Avitaminosis D 05/08/2015    No past medical history on file.  Social History   Social History  . Marital status: Married    Spouse name: N/A  . Number of children: N/A  . Years of education: N/A   Occupational History  . Not on file.   Social History Main Topics  . Smoking status: Never Smoker  . Smokeless tobacco: Never Used  . Alcohol use Yes     Comment: 12-14 Drinks a week.  . Drug use: No  . Sexual activity: Not on file   Other Topics Concern  . Not on file   Social History Narrative  . No narrative on file    Outpatient Encounter Prescriptions as of 10/28/2016  Medication Sig Note  . ALPRAZolam (XANAX) 0.5 MG tablet Take 1 tablet (0.5 mg total) by mouth 2  (two) times daily as needed for anxiety.   Marland Kitchen. aspirin 81 MG tablet Take by mouth. 05/08/2015: Received from: [facility]  . atorvastatin (LIPITOR) 40 MG tablet Take 1 tablet by mouth at  bedtime   . cholecalciferol (VITAMIN D) 1000 UNITS tablet Take by mouth. 05/08/2015: Received from: [facility]  . glucosamine-chondroitin 500-400 MG tablet Take 1 tablet by mouth 3 (three) times daily.   . MULTIPLE VITAMINS PO Take by mouth. 05/08/2015: Received from: [facility]  . naproxen sodium (ALEVE) 220 MG tablet Take 1 tablet (220 mg total) by mouth 2 (two) times daily with a meal.   . Olmesartan-Amlodipine-HCTZ 40-10-25 MG TABS TAKE 1 TABLET BY MOUTH DAILY   . [DISCONTINUED] azithromycin (ZITHROMAX) 250 MG tablet 2 po day one then 1 daily   . [DISCONTINUED] chlorpheniramine-HYDROcodone (TUSSIONEX PENNKINETIC ER) 10-8 MG/5ML SUER Take 5 mLs by mouth every 12 (twelve) hours as needed for cough.   . [DISCONTINUED] predniSONE (DELTASONE) 10 MG tablet Take 1 tablet (10 mg total) by mouth See admin instructions. Take 60 mg day 1 and taper by 10 mg each day    No facility-administered encounter medications on file as of 10/28/2016.     No Known Allergies  Review of Systems  Constitutional: Positive for malaise/fatigue. Negative for  chills, diaphoresis and fever.  HENT: Positive for congestion, sinus pain (pressure, feels better when he presses on his undereyes.) and sore throat (from cough). Negative for ear discharge, ear pain, hearing loss, nosebleeds and tinnitus.   Eyes: Negative for blurred vision, double vision, photophobia, pain, discharge and redness.       Patient sees black spots when he coughs hard or when he first gets up from sitting.   Respiratory: Positive for cough, sputum production, shortness of breath and wheezing. Negative for hemoptysis.   Cardiovascular: Positive for orthopnea. Negative for chest pain, palpitations and claudication.    Neurological: Positive for weakness.  Endo/Heme/Allergies: Negative.   Psychiatric/Behavioral: Negative.     Objective:  BP 112/70 (BP Location: Right Arm, Patient Position: Sitting, Cuff Size: Normal)   Pulse 72   Temp 97.9 F (36.6 C) (Oral)   Resp 16   Wt 236 lb (107 kg)   BMI 32.92 kg/m   Physical Exam  Constitutional: He is oriented to person, place, and time and well-developed, well-nourished, and in no distress.  HENT:  Head: Normocephalic and atraumatic.  Right Ear: External ear normal.  Left Ear: External ear normal.  Nose: Nose normal.  Mouth/Throat: Oropharynx is clear and moist.  Eyes: Conjunctivae are normal. No scleral icterus.  Neck: No thyromegaly present.  Cardiovascular: Normal rate, regular rhythm and normal heart sounds.   Pulmonary/Chest: Effort normal and breath sounds normal.  Abdominal: Soft.  Musculoskeletal:  Mild tenderness in the right-sided chest which reproduces mild pain he is having.  Lymphadenopathy:    He has no cervical adenopathy.  Neurological: He is alert and oriented to person, place, and time.  Skin: Skin is warm and dry.  Psychiatric: Mood, memory, affect and judgment normal.    Assessment and Plan :    1. Chronic bronchitis, unspecified chronic bronchitis type (HCC) He has had on and off cough and illness in September. He has been on Z-Pak, doxycycline and a prednisone taper in addition to cough medications. At this time treat with Augmentin and another prednisone taper but refer to pulmonary. - Ambulatory referral to Pulmonology - amoxicillin-clavulanate (AUGMENTIN) 875-125 MG tablet; Take 1 tablet by mouth 2 (two) times daily.  Dispense: 20 tablet; Refill: 0 - predniSONE (STERAPRED UNI-PAK 48 TAB) 10 MG (48) TBPK tablet; Take by mouth daily. 12 day taper- 6 PO x 2 days, 5 PO x 2 days, 4 PO x 2 days, 3 PO x 2 days, 2 PO x 2 days and then 1 PO x 2 days  Dispense: 42 tablet; Refill: 0 - chlorpheniramine-HYDROcodone (TUSSIONEX)  10-8 MG/5ML suspension 5 mL; Take 5 mLs by mouth every 12 (twelve) hours. 2. Right sided costochondritis 3. Chronic cough Tussionex for the nocturnal cough.  HPI, Exam and A&P Transcribed under the direction and in the presence of Julieanne Mansonichard Zakai Gonyea, Montez HagemanJr., MD. Electronically Signed: Janey GreaserElena DeSanto, RMA I have done the exam and reviewed the chart and it is accurate to the best of my knowledge. [employer]  technology has been used and  any errors in dictation or transcription are unintentional. Julieanne Mansonichard Ranvir Renovato M.D. [facility]

## 2016-11-05 ENCOUNTER — Ambulatory Visit (INDEPENDENT_AMBULATORY_CARE_PROVIDER_SITE_OTHER): Payer: Medicare Other | Admitting: Cardiovascular Disease

## 2016-11-05 ENCOUNTER — Encounter: Payer: Self-pay | Admitting: Cardiovascular Disease

## 2016-11-05 VITALS — BP 132/86 | HR 60 | Ht 69.0 in | Wt 229.5 lb

## 2016-11-05 DIAGNOSIS — Z7289 Other problems related to lifestyle: Secondary | ICD-10-CM

## 2016-11-05 DIAGNOSIS — E782 Mixed hyperlipidemia: Secondary | ICD-10-CM

## 2016-11-05 DIAGNOSIS — I10 Essential (primary) hypertension: Secondary | ICD-10-CM

## 2016-11-05 DIAGNOSIS — Z789 Other specified health status: Secondary | ICD-10-CM

## 2016-11-05 DIAGNOSIS — R55 Syncope and collapse: Secondary | ICD-10-CM

## 2016-11-05 NOTE — Patient Instructions (Addendum)
Medication Instructions:   No medication changes made  Labwork:  No new labs needed  Testing/Procedures:  No further testing at this time   I recommend watching educational videos on topics of interest to you at:       [email or web address]  Enter code: HEARTCARE    Follow-Up: It was a pleasure seeing you in the office today. Please call us if you have new issues that need to be addressed before your next appt.  [phone]  Your physician wants you to follow-up in:  as needed   If you need a refill on your cardiac medications before your next appointment, please call your pharmacy.     

## 2016-11-05 NOTE — Progress Notes (Addendum)
Cardiology Office Note  Date:  11/05/2016   ID:  Christian [name], DOB Nov 25, 1950, MRN [id number]  PCP:  Megan Mansichard Gilbert Jr, MD   Chief Complaint  Patient presents with  . other     New Patient. Referred by Dr.Gilbert for Syncope on 11/15. Pt c/o right side chest soreness. Pt states he is just now getting over respiratory illness since October. Reviewed meds with pt verbally.    HPI:  [name] is a pleasant 65 year old gentleman with history of hyperlipidemia, heavy alcohol use, who presents by her referral from Dr. Sullivan LoneGilbert for consultation of recent episode of syncope.  Notes reviewed Patient reports developing upper respiratory infection September 2017 that lingered for several months. Severe coughing, chest congestion He has completed ABX x 3 , prednisone x 2 courses In November he was on the telephone try not to cough, holding his breath Finally got off the phone, did a very big cough, felt lightheaded, passed out Before the wife had chance to call 911, he recovered and was getting up again offered round Denies any further episodes since that time  Currently feels back to his baseline, no shortness of breath or chest discomfort on exertion  He reports that he is retired, previously worked with heavy machinery Has been told in the past he has extra beats He is asymptomatic on these ectopy  On lipitor for many years HBa1C 5.7  Drinks  ETOH, beer, 12 to 18 a week  In terms of his family history Dad died colon CA late 6860s, Mother in the 90s  EKG on today's visit shows normal sinus rhythm with rate 60 bpm, APCs   PMH:   has a past medical history of Anxiety; Bradycardia; Hyperlipidemia; and Hypertension.  PSH:    Past Surgical History:  Procedure Laterality Date  . INGUINAL HERNIA REPAIR Left   . vasectomy  1978    Current Outpatient Prescriptions  Medication Sig Dispense Refill  . ALPRAZolam (XANAX) 0.5 MG tablet Take 1 tablet (0.5 mg total) by mouth 2 (two)  times daily as needed for anxiety. 60 tablet 5  . amoxicillin-clavulanate (AUGMENTIN) 875-125 MG tablet Take 1 tablet by mouth 2 (two) times daily. 20 tablet 0  . aspirin 81 MG tablet Take by mouth.    Marland Kitchen. atorvastatin (LIPITOR) 40 MG tablet Take 1 tablet by mouth at  bedtime 90 tablet 3  . cholecalciferol (VITAMIN D) 1000 UNITS tablet Take by mouth.    Marland Kitchen. glucosamine-chondroitin 500-400 MG tablet Take 1 tablet by mouth 3 (three) times daily.    . Olmesartan-Amlodipine-HCTZ 40-10-25 MG TABS TAKE 1 TABLET BY MOUTH DAILY 90 tablet 3  . predniSONE (STERAPRED UNI-PAK 48 TAB) 10 MG (48) TBPK tablet Take by mouth daily. 12 day taper- 6 PO x 2 days, 5 PO x 2 days, 4 PO x 2 days, 3 PO x 2 days, 2 PO x 2 days and then 1 PO x 2 days 42 tablet 0   Current Facility-Administered Medications  Medication Dose Route Frequency Provider Last Rate Last Dose  . chlorpheniramine-HYDROcodone (TUSSIONEX) 10-8 MG/5ML suspension 5 mL  5 mL Oral Q12H Richard Hulen ShoutsL Gilbert Jr., MD         Allergies:   Patient has no known allergies.   Social History:  The patient  reports that he has never smoked. He has never used smokeless tobacco. He reports that he drinks alcohol. He reports that he does not use drugs.   Family History:   family history includes  Colon cancer in his father; Dementia in his mother; Hypertension in his sister and son.    Review of Systems: Review of Systems  Constitutional: Negative.   Respiratory: Negative.   Cardiovascular: Negative.   Gastrointestinal: Negative.   Musculoskeletal: Negative.   Neurological: Negative.   Psychiatric/Behavioral: Negative.   All other systems reviewed and are negative.    PHYSICAL EXAM: VS:  BP 132/86 (BP Location: Right Arm, Patient Position: Sitting, Cuff Size: Large)   Pulse 60   Ht 5\' 9"  (1.753 m)   Wt 229 lb 8 oz (104.1 kg)   BMI 33.89 kg/m  , BMI Body mass index is 33.89 kg/m. GEN: Well nourished, well developed, in no acute distress, obese  HEENT:  normal  Neck: no JVD, carotid bruits, or masses Cardiac: RRR; no murmurs, rubs, or gallops,no edema  Respiratory:  clear to auscultation bilaterally, normal work of breathing GI: soft, nontender, nondistended, + BS MS: no deformity or atrophy  Skin: warm and dry, no rash Neuro:  Strength and sensation are intact Psych: euthymic mood, full affect    Recent Labs: 07/14/2016: ALT 40; BUN 24; Creatinine, Ser 1.10; Platelets 207; Potassium 4.9; Sodium 139; TSH 1.080    Lipid Panel Lab Results  Component Value Date   CHOL 132 07/14/2016   HDL 43 07/14/2016   LDLCALC 69 07/14/2016   TRIG 100 07/14/2016      Wt Readings from Last 3 Encounters:  11/05/16 229 lb 8 oz (104.1 kg)  10/28/16 236 lb (107 kg)  10/15/16 235 lb (106.6 kg)       ASSESSMENT AND PLAN:  Syncope, unspecified syncope type - Plan: EKG 12-Lead Cough syncope in the setting of upper respiratory infection No further episodes, currently at his baseline Clinical exam essentially benign, no further workup at this time If he has recurrent episodes, additional workup would likely be needed  Alcohol drinker Recommended he try to moderate his alcohol consumption Long discussion concerning effects of long-term alcohol abuse  Mixed hyperlipidemia Cholesterol is at goal on the current lipid regimen. No changes to the medications were made.  Essential hypertension Blood pressure is well controlled on today's visit. No changes made to the medications.  Encounter for general medical evaluation Wife presents with the patient today, discussed various treatment options for risk stratification. We did discuss CT coronary calcium scoring His cholesterol is well controlled, he is a nonsmoker, no diabetes No testing ordered at this time  Disposition:   F/U  as needed   Total encounter time more than 45 minutes  Greater than 50% was spent in counseling and coordination of care with the patient    Orders Placed This  Encounter  Procedures  . EKG 12-Lead     Signed, Dossie Arbourim Gollan, M.D., Ph.D. 11/05/2016  [facility] [place] [phone]

## 2016-12-09 ENCOUNTER — Telehealth: Payer: Self-pay

## 2016-12-09 NOTE — Telephone Encounter (Signed)
Attempted to schedule appt from referral x 3.  Patient stated he has already taken care of breathing issues and doesn't need to be seen at this time.

## 2016-12-23 ENCOUNTER — Telehealth: Payer: Self-pay | Admitting: Family Medicine

## 2016-12-23 NOTE — Telephone Encounter (Signed)
Called Pt to RE-schedule AWV with [facility] to 9am knb

## 2017-01-18 ENCOUNTER — Ambulatory Visit: Payer: Medicare Other | Admitting: Family Medicine

## 2017-01-19 ENCOUNTER — Ambulatory Visit (INDEPENDENT_AMBULATORY_CARE_PROVIDER_SITE_OTHER): Payer: Medicare Other

## 2017-01-19 ENCOUNTER — Ambulatory Visit (INDEPENDENT_AMBULATORY_CARE_PROVIDER_SITE_OTHER): Payer: Medicare Other | Admitting: Family Medicine

## 2017-01-19 ENCOUNTER — Encounter: Payer: Self-pay | Admitting: Family Medicine

## 2017-01-19 VITALS — BP 128/70 | HR 60 | Temp 98.4°F | Resp 16 | Wt 235.4 lb

## 2017-01-19 VITALS — BP 146/78 | HR 60 | Temp 98.4°F | Ht 69.0 in | Wt 235.4 lb

## 2017-01-19 DIAGNOSIS — R739 Hyperglycemia, unspecified: Secondary | ICD-10-CM | POA: Diagnosis not present

## 2017-01-19 DIAGNOSIS — E782 Mixed hyperlipidemia: Secondary | ICD-10-CM | POA: Diagnosis not present

## 2017-01-19 DIAGNOSIS — I10 Essential (primary) hypertension: Secondary | ICD-10-CM

## 2017-01-19 DIAGNOSIS — Z0000 Encounter for general adult medical examination without abnormal findings: Secondary | ICD-10-CM | POA: Diagnosis not present

## 2017-01-19 LAB — POCT GLYCOSYLATED HEMOGLOBIN (HGB A1C): HEMOGLOBIN A1C: 6

## 2017-01-19 NOTE — Patient Instructions (Signed)

## 2017-01-19 NOTE — Progress Notes (Signed)
Subjective:  HPI  Hypertension, follow-up:  BP Readings from Last 3 Encounters:  01/19/17 128/70  01/19/17 (!) 146/78  11/05/16 132/86    He was last seen for hypertension 6 months ago.  BP at that visit was 132/86. Management since that visit includes none. He reports good compliance with treatment. He is not having side effects.  He is not exercising. He is adherent to low salt diet.   Outside blood pressures are running about what today's is. He is experiencing none.  Patient denies chest pain, chest pressure/discomfort, claudication, dyspnea, exertional chest pressure/discomfort, fatigue, irregular heart beat, lower extremity edema, near-syncope, orthopnea, palpitations, paroxysmal nocturnal dyspnea, syncope and tachypnea.   Cardiovascular risk factors include advanced age (older than 68 for men, 63 for women), dyslipidemia, hypertension and male gender.  Wt Readings from Last 3 Encounters:  01/19/17 235 lb 6.4 oz (106.8 kg)  01/19/17 235 lb 6.4 oz (106.8 kg)  11/05/16 229 lb 8 oz (104.1 kg)   ------------------------------------------------------------------------   Lipid/Cholesterol, Follow-up:   Last seen for this 6 months ago.  Management changes since that visit include none. . Last Lipid Panel:    Component Value Date/Time   CHOL 132 07/14/2016 0850   TRIG 100 07/14/2016 0850   HDL 43 07/14/2016 0850   LDLCALC 69 07/14/2016 0850    He reports good compliance with treatment.  Wt Readings from Last 3 Encounters:  01/19/17 235 lb 6.4 oz (106.8 kg)  01/19/17 235 lb 6.4 oz (106.8 kg)  11/05/16 229 lb 8 oz (104.1 kg)   -------------------------------------------------------------------  Hyperglycemia, Follow-up:   Lab Results  Component Value Date   HGBA1C 6.0 01/19/2017   HGBA1C 5.7 07/14/2016   HGBA1C 6.1 (H) 07/01/2015   ------------------------------------------------------------------------    Prior to Admission medications   Medication  Sig Start Date End Date Taking? Authorizing Provider  ALPRAZolam Prudy Feeler) 0.5 MG tablet Take 1 tablet (0.5 mg total) by mouth 2 (two) times daily as needed for anxiety. 08/03/16   Maple Hudson., MD  aspirin 81 MG tablet Take by mouth. 02/11/12   Historical Provider, MD  atorvastatin (LIPITOR) 40 MG tablet Take 1 tablet by mouth at  bedtime Patient not taking: Reported on 01/19/2017 07/10/16   Maple Hudson., MD  cholecalciferol (VITAMIN D) 1000 UNITS tablet Take by mouth. 02/11/12   Historical Provider, MD  glucosamine-chondroitin 500-400 MG tablet Take 1 tablet by mouth 3 (three) times daily.    Historical Provider, MD  Olmesartan-Amlodipine-HCTZ 40-10-25 MG TABS TAKE 1 TABLET BY MOUTH DAILY 08/17/16   Maple Hudson., MD    Patient Active Problem List   Diagnosis Date Noted  . Hypercalcemia 07/01/2015  . Alcohol drinker 05/08/2015  . Anxiety 05/08/2015  . Depression, major, single episode, mild (HCC) 05/08/2015  . Acid reflux 05/08/2015  . Difficulty hearing 05/08/2015  . Blood glucose elevated 05/08/2015  . HLD (hyperlipidemia) 05/08/2015  . BP (high blood pressure) 05/08/2015  . Cannot sleep 05/08/2015  . Osteoarthrosis 05/08/2015  . Avitaminosis D 05/08/2015    Past Medical History:  Diagnosis Date  . Anxiety   . Bradycardia   . Hyperlipidemia   . Hypertension     Social History   Social History  . Marital status: Married    Spouse name: N/A  . Number of children: N/A  . Years of education: N/A   Occupational History  . Not on file.   Social History Main Topics  . Smoking status: Never Smoker  .  Smokeless tobacco: Current User    Types: Snuff     Comment: uses smokeless tobacco "dip"  . Alcohol use 7.2 - 8.4 oz/week    12 - 14 Cans of beer per week  . Drug use: No  . Sexual activity: Not on file   Other Topics Concern  . Not on file   Social History Narrative  . No narrative on file    No Known Allergies  Review of Systems    Constitutional: Negative.   HENT: Negative.   Eyes: Negative.   Respiratory: Negative.   Cardiovascular: Negative.   Gastrointestinal: Negative.   Genitourinary: Negative.   Musculoskeletal: Negative.   Skin: Negative.   Neurological: Negative.   Endo/Heme/Allergies: Negative.   Psychiatric/Behavioral: Negative.     Immunization History  Administered Date(s) Administered  . Influenza, High Dose Seasonal PF 09/09/2015, 07/14/2016  . Pneumococcal Conjugate-13 01/01/2015  . Pneumococcal Polysaccharide-23 01/07/2016  . Tdap 07/29/2011  . Zoster 09/07/2013    Objective:   Vitals:   01/19/17 0940 01/19/17 0941  BP: (!) 146/78 128/70  Pulse: 60   Resp: 16   Temp: 98.4 F (36.9 C)   TempSrc: Oral   Weight: 235 lb 6.4 oz (106.8 kg)      Physical Exam  Constitutional: He is oriented to person, place, and time and well-developed, well-nourished, and in no distress.  Eyes: Conjunctivae and EOM are normal. Pupils are equal, round, and reactive to light.  Neck: Normal range of motion. Neck supple.  Cardiovascular: Normal rate, regular rhythm, normal heart sounds and intact distal pulses.   Pulmonary/Chest: Effort normal and breath sounds normal.  Musculoskeletal: Normal range of motion.  Neurological: He is alert and oriented to person, place, and time. He has normal reflexes. Gait normal. GCS score is 15.  Skin: Skin is warm and dry.  Psychiatric: Mood, memory, affect and judgment normal.    Lab Results  Component Value Date   WBC 6.3 07/14/2016   HGB 15.7 09/07/2013   HCT 45.6 07/14/2016   PLT 207 07/14/2016   GLUCOSE 125 (H) 07/14/2016   CHOL 132 07/14/2016   TRIG 100 07/14/2016   HDL 43 07/14/2016   LDLCALC 69 07/14/2016   TSH 1.080 07/14/2016   PSA 1.3 09/07/2013   HGBA1C 6.0 01/19/2017    CMP     Component Value Date/Time   NA 139 07/14/2016 0850   K 4.9 07/14/2016 0850   CL 99 07/14/2016 0850   CO2 25 07/14/2016 0850   GLUCOSE 125 (H) 07/14/2016 0850    BUN 24 07/14/2016 0850   CREATININE 1.10 07/14/2016 0850   CALCIUM 10.0 07/14/2016 0850   PROT 7.4 07/14/2016 0850   ALBUMIN 4.7 07/14/2016 0850   AST 24 07/14/2016 0850   ALT 40 07/14/2016 0850   ALKPHOS 75 07/14/2016 0850   BILITOT 0.6 07/14/2016 0850   GFRNONAA 69 07/14/2016 0850   GFRAA 80 07/14/2016 0850    Assessment and Plan :  1. Essential hypertension Stable.   2. Mixed hyperlipidemia Stable.   3. Hyperglycemia  - POCT HgB A1C 6.0 today. Worse. Pt to work on diet and exercise.   HPI, Exam, and A&P Transcribed under the direction and in the presence of Richard L. Wendelyn BreslowGilbert Jr, MD  Electronically Signed: Silvio PateBrittany O'Dell, CMA I have done the exam and reviewed the above chart and it is accurate to the best of my knowledge. Dentist[employer]agon  technology has been used in this note in any air is in the  dictation or transcription are unintentional.  Julieanne Manson MD [facility] 01/19/2017 10:07 AM

## 2017-01-19 NOTE — Progress Notes (Signed)
Subjective:   Christian [name] is a 66 y.o. male who presents for Medicare Annual/Subsequent preventive examination.  Review of Systems:  N/A  Cardiac Risk Factors include: advanced age (>6555men, 61>65 women);dyslipidemia;hypertension;male gender;obesity (BMI >30kg/m2);smoking/ tobacco exposure     Objective:    Vitals: BP (!) 146/78 (BP Location: Right Arm)   Pulse 60   Temp 98.4 F (36.9 C) (Oral)   Ht 5\' 9"  (1.753 m)   Wt 235 lb 6.4 oz (106.8 kg)   BMI 34.76 kg/m   Body mass index is 34.76 kg/m.  Tobacco History  Smoking Status  . Never Smoker  Smokeless Tobacco  . Current User  . Types: Snuff    Comment: uses smokeless tobacco "dip"     Ready to quit: Yes Counseling given: Yes   Past Medical History:  Diagnosis Date  . Anxiety   . Bradycardia   . Hyperlipidemia   . Hypertension    Past Surgical History:  Procedure Laterality Date  . INGUINAL HERNIA REPAIR Left   . vasectomy  [id number]   Family History  Problem Relation Age of Onset  . Dementia Mother   . Colon cancer Father   . Hypertension Sister   . Hypertension Son    History  Sexual Activity  . Sexual activity: Not on file    Outpatient Encounter Prescriptions as of 01/19/2017  Medication Sig  . ALPRAZolam (XANAX) 0.5 MG tablet Take 1 tablet (0.5 mg total) by mouth 2 (two) times daily as needed for anxiety.  Marland Kitchen. aspirin 81 MG tablet Take by mouth.  . cholecalciferol (VITAMIN D) 1000 UNITS tablet Take by mouth.  Marland Kitchen. glucosamine-chondroitin 500-400 MG tablet Take 1 tablet by mouth 3 (three) times daily.  . Olmesartan-Amlodipine-HCTZ 40-10-25 MG TABS TAKE 1 TABLET BY MOUTH DAILY  . atorvastatin (LIPITOR) 40 MG tablet Take 1 tablet by mouth at  bedtime (Patient not taking: Reported on 01/19/2017)  . [DISCONTINUED] amoxicillin-clavulanate (AUGMENTIN) 875-125 MG tablet Take 1 tablet by mouth 2 (two) times daily. (Patient not taking: Reported on 01/19/2017)  . [DISCONTINUED] predniSONE (STERAPRED UNI-PAK 48  TAB) 10 MG (48) TBPK tablet Take by mouth daily. 12 day taper- 6 PO x 2 days, 5 PO x 2 days, 4 PO x 2 days, 3 PO x 2 days, 2 PO x 2 days and then 1 PO x 2 days (Patient not taking: Reported on 01/19/2017)   Facility-Administered Encounter Medications as of 01/19/2017  Medication  . chlorpheniramine-HYDROcodone (TUSSIONEX) 10-8 MG/5ML suspension 5 mL    Activities of Daily Living In your present state of health, do you have any difficulty performing the following activities: 01/19/2017  Hearing? Y  Vision? N  Difficulty concentrating or making decisions? N  Walking or climbing stairs? N  Dressing or bathing? N  Doing errands, shopping? N  Preparing Food and eating ? N  Using the Toilet? N  In the past six months, have you accidently leaked urine? N  Do you have problems with loss of bowel control? N  Managing your Medications? N  Managing your Finances? N  Housekeeping or managing your Housekeeping? N  Some recent data might be hidden    Patient Care Team: Maple Hudsonichard L Nicole Defino Jr., MD as PCP - General (Family Medicine) Sallee LangeSteven Dingeldein, MD as Consulting Physician (Ophthalmology) Earline MayotteJeffrey W Byrnett, MD as Consulting Physician (General Surgery)   Assessment:    Exercise Activities and Dietary recommendations Current Exercise Habits: The patient does not participate in regular exercise at present (just work  and fishing), Exercise limited by: None identified  Goals    . Increase water intake          Recommend increasing water intake to 4-5 glasses a day.      Fall Risk Fall Risk  01/19/2017 01/07/2016 09/09/2015  Falls in the past year? No No No   Depression Screen PHQ 2/9 Scores 01/19/2017 01/19/2017 01/07/2016 09/09/2015  PHQ - 2 Score 0 0 0 0  PHQ- 9 Score 0 - - -    Cognitive Function     6CIT Screen 01/19/2017  What Year? 0 points  What month? 0 points  What time? 0 points  Count back from 20 0 points  Months in reverse 2 points  Repeat phrase 2 points  Total Score 4     Immunization History  Administered Date(s) Administered  . Influenza, High Dose Seasonal PF 09/09/2015, 07/14/2016  . Pneumococcal Conjugate-13 01/01/2015  . Pneumococcal Polysaccharide-23 01/07/2016  . Tdap 07/29/2011  . Zoster 09/07/2013   Screening Tests Health Maintenance  Topic Date Due  . COLONOSCOPY  10/02/2019  . TETANUS/TDAP  07/28/2021  . INFLUENZA VACCINE  Completed  . Hepatitis C Screening  Completed  . PNA vac Low Risk Adult  Completed      Plan:  I have personally reviewed and addressed the Medicare Annual Wellness questionnaire and have noted the following in the patient's chart:  A. Medical and social history B. Use of alcohol, tobacco or illicit drugs  C. Current medications and supplements D. Functional ability and status E.  Nutritional status F.  Physical activity G. Advance directives H. List of other physicians I.  Hospitalizations, surgeries, and ER visits in previous 12 months J.  Vitals K. Screenings such as hearing and vision if needed, cognitive and depression L. Referrals and appointments - none  In addition, I have reviewed and discussed with patient certain preventive protocols, quality metrics, and best practice recommendations. A written personalized care plan for preventive services as well as general preventive health recommendations were provided to patient.  See attached scanned questionnaire for additional information.   Signed,  Hyacinth Meeker, LPN Nurse Health Advisor   MD Recommendations: None. I have reviewed the health advisors note, was  available for consultation and I agree with documentation and plan. Julieanne Manson MD [facility]

## 2017-02-02 IMAGING — CR DG CHEST 2V
1 series · 2 of 2 positions shown · non-contrast
Comparison: None.

CLINICAL DATA: Cough for over 1 year.

EXAM:
CHEST  2 VIEW

[Series 2: dg chest 2 view · 0.14mm/px · 2 of 2 slices shown]
[im 1/2]
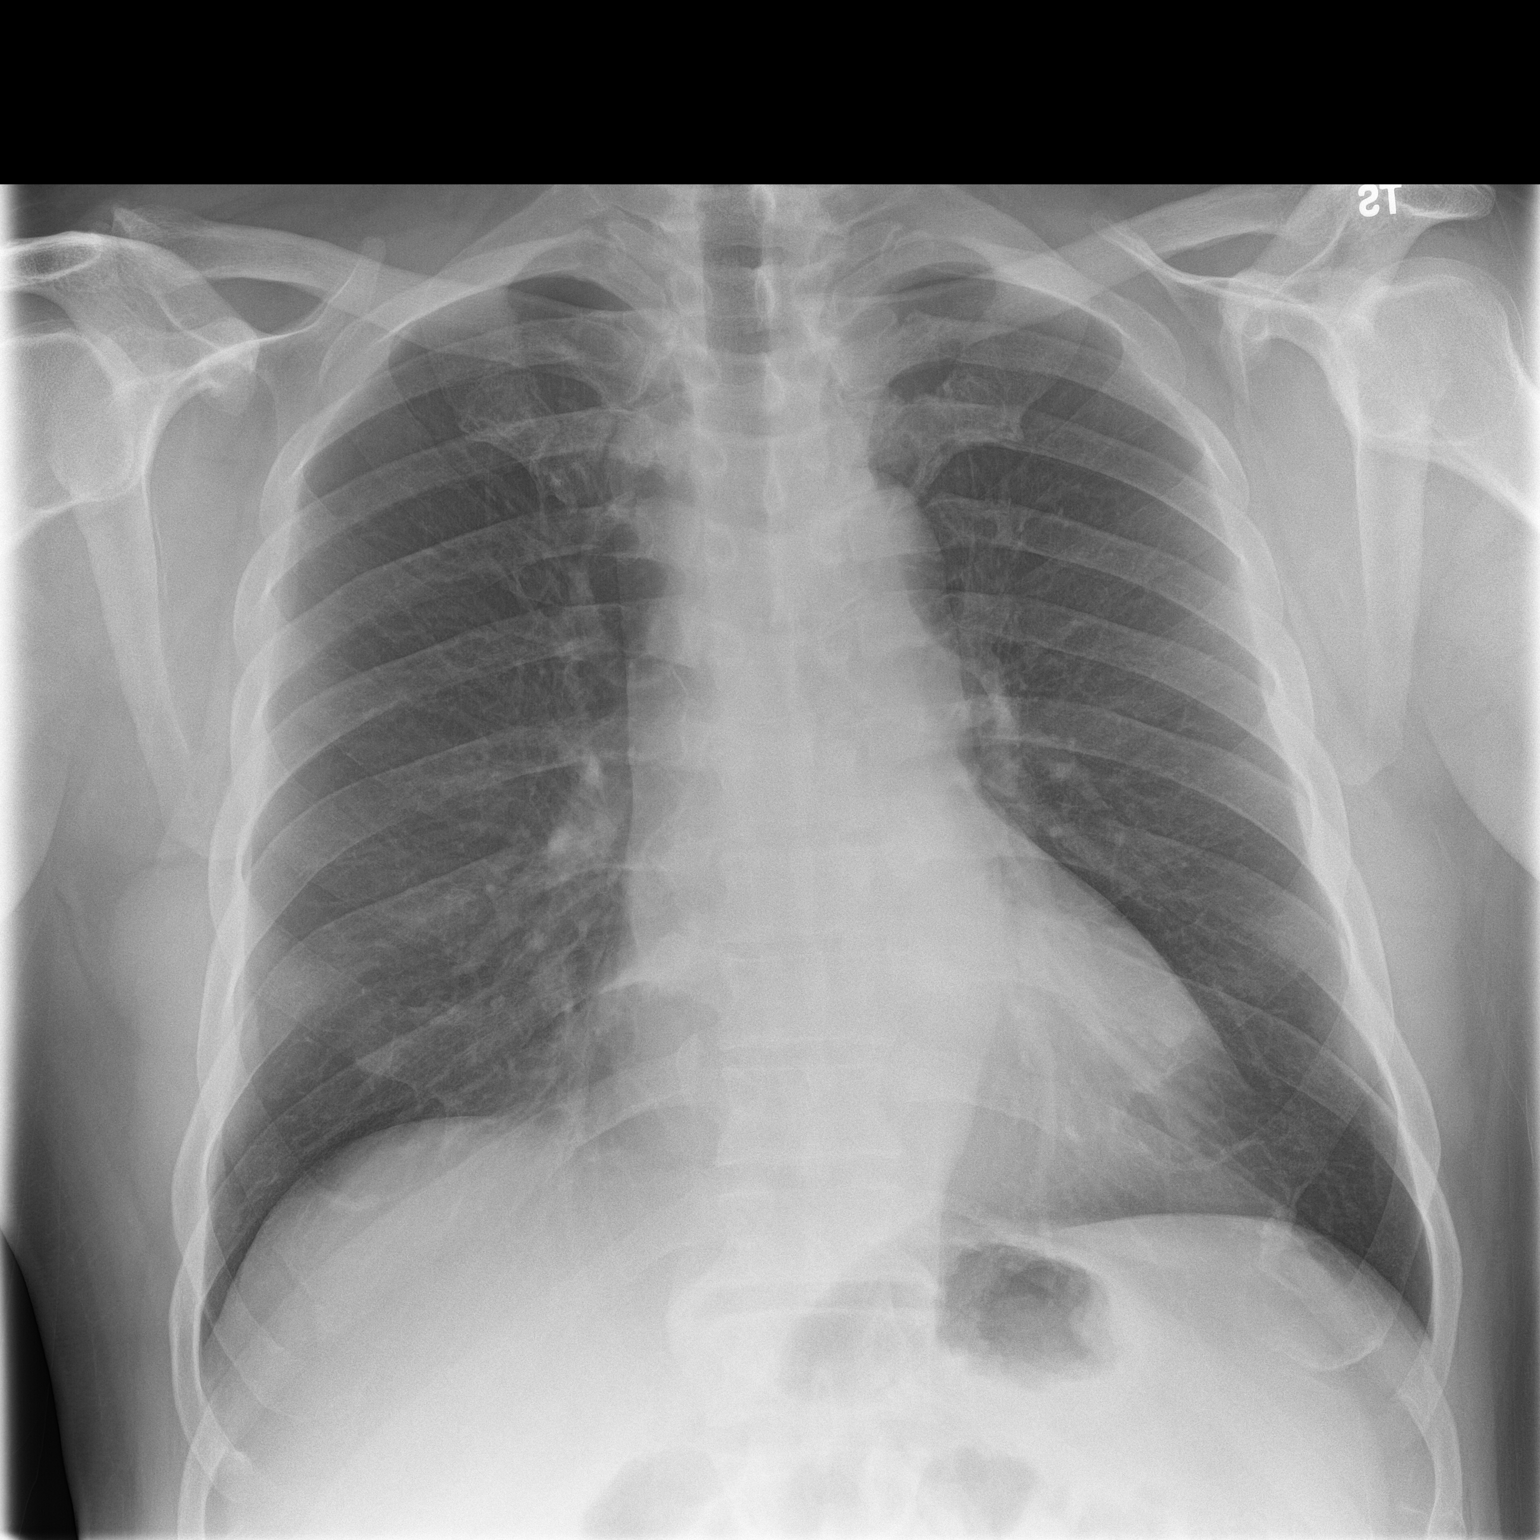
[im 2/2]
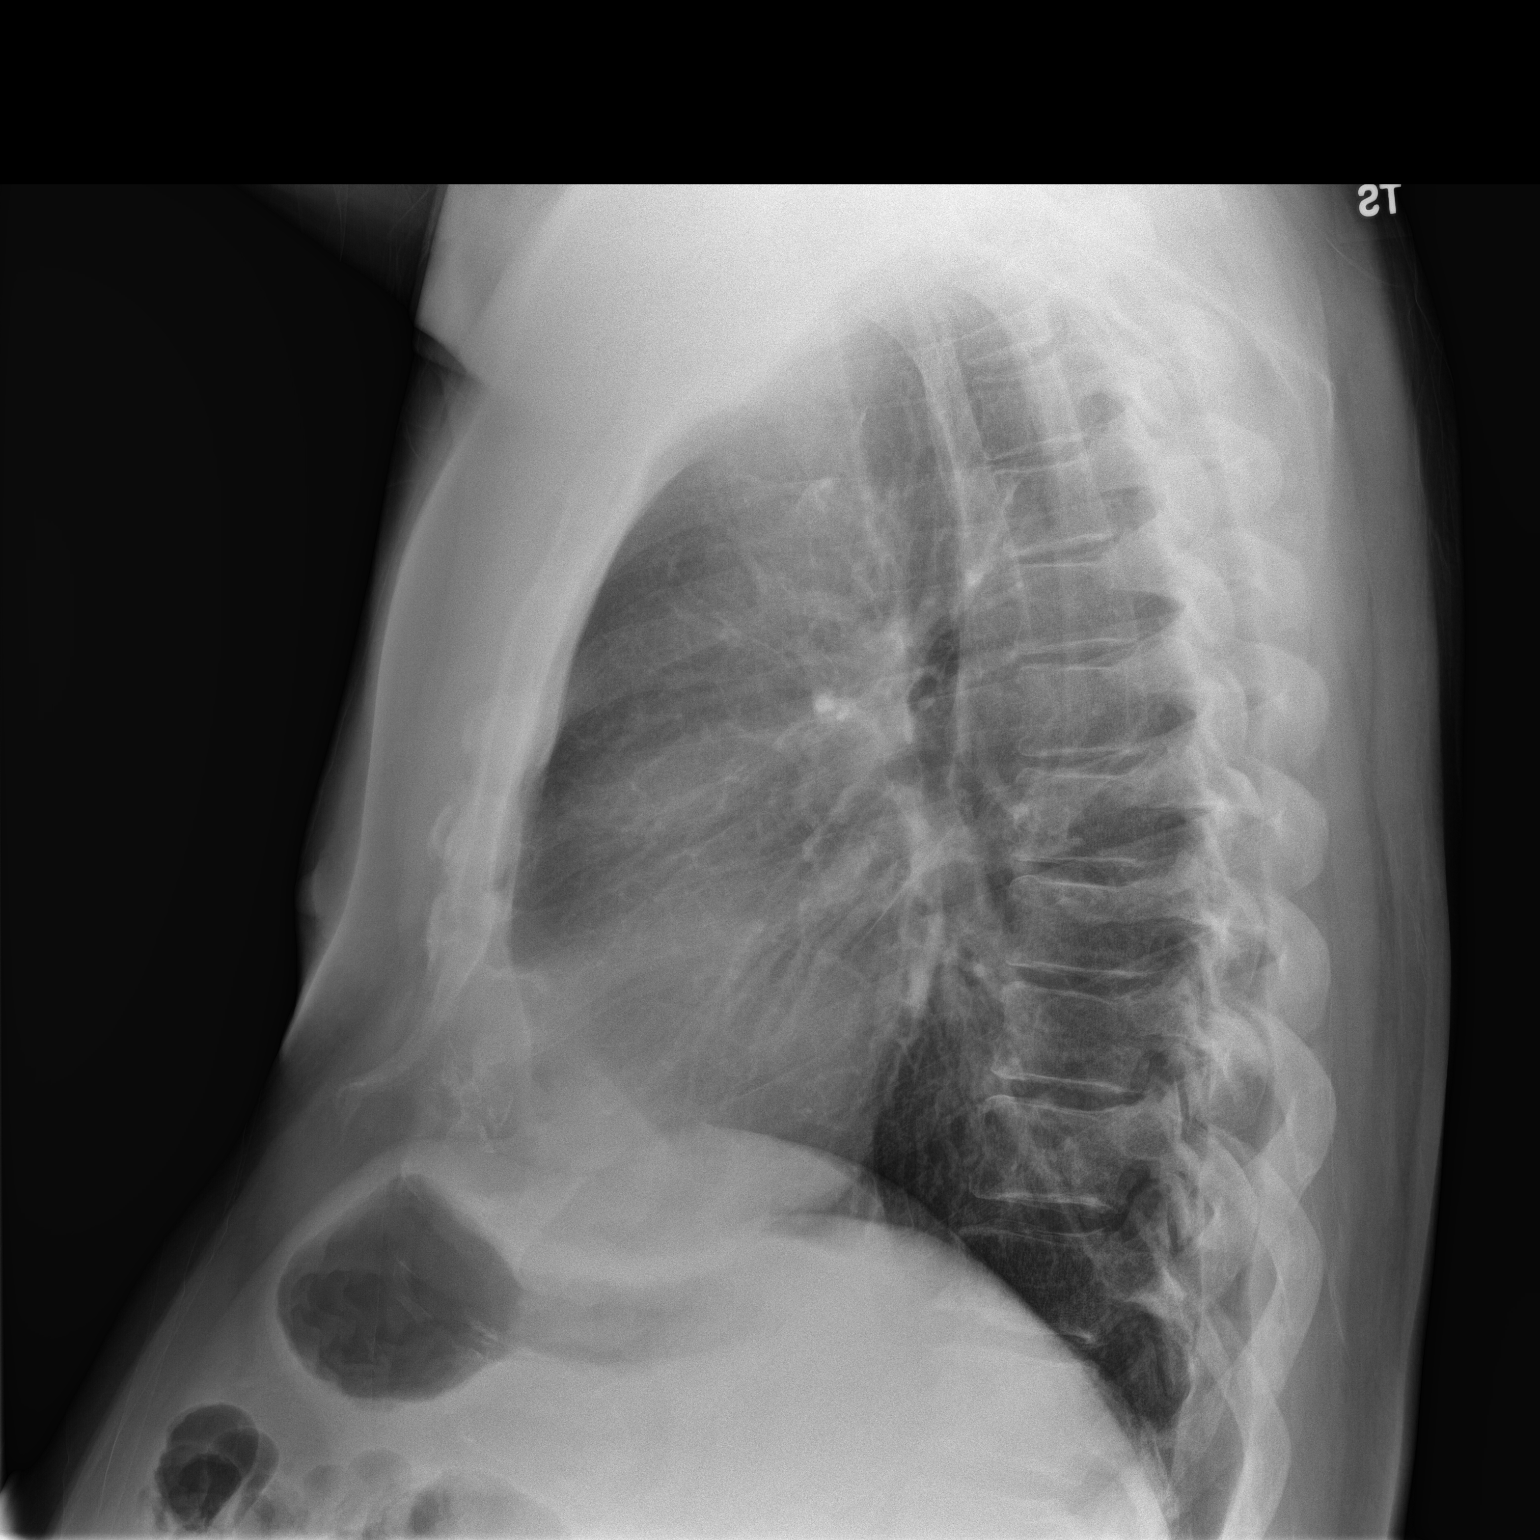

[2 of 2 positions shown; findings below may reference images not displayed]

FINDINGS: Heart and mediastinal contours are within normal limits. No focal
opacities or effusions. No acute bony abnormality.
IMPRESSION: No active cardiopulmonary disease.

## 2017-05-17 DIAGNOSIS — H2513 Age-related nuclear cataract, bilateral: Secondary | ICD-10-CM | POA: Diagnosis not present

## 2017-07-22 ENCOUNTER — Ambulatory Visit: Payer: Medicare Other | Admitting: Family Medicine

## 2017-07-26 ENCOUNTER — Encounter: Payer: Self-pay | Admitting: Family Medicine

## 2017-07-26 ENCOUNTER — Ambulatory Visit (INDEPENDENT_AMBULATORY_CARE_PROVIDER_SITE_OTHER): Payer: Medicare Other | Admitting: Family Medicine

## 2017-07-26 VITALS — BP 124/64 | HR 50 | Temp 97.9°F | Resp 16 | Wt 240.0 lb

## 2017-07-26 DIAGNOSIS — E782 Mixed hyperlipidemia: Secondary | ICD-10-CM

## 2017-07-26 DIAGNOSIS — Z23 Encounter for immunization: Secondary | ICD-10-CM | POA: Diagnosis not present

## 2017-07-26 DIAGNOSIS — R739 Hyperglycemia, unspecified: Secondary | ICD-10-CM

## 2017-07-26 DIAGNOSIS — I10 Essential (primary) hypertension: Secondary | ICD-10-CM | POA: Diagnosis not present

## 2017-07-26 LAB — POCT GLYCOSYLATED HEMOGLOBIN (HGB A1C): HEMOGLOBIN A1C: 6.2

## 2017-07-26 NOTE — Progress Notes (Signed)
Patient: Christian [name] Male    DOB: January 09, 1951   66 y.o.   MRN: [id number] Visit Date: 07/26/2017  Today's Provider: Megan Mans, MD   Chief Complaint  Patient presents with  . Hypertension  . Hyperglycemia   Subjective:    HPI  overall patient feels well. He works part-time as a retired from full-time work in 2016.  Hypertension, follow-up:  BP Readings from Last 3 Encounters:  07/26/17 124/64  01/19/17 128/70  01/19/17 (!) 146/78    He was last seen for hypertension 6 months ago.  BP at that visit was 128/70. Management since that visit includes none. He reports good compliance with treatment. He is not having side effects.  He is not exercising. He is adherent to low salt diet.   Outside blood pressures are running 130-140's/60-70's.  Patient denies chest pain, chest pressure/discomfort, claudication, dyspnea, exertional chest pressure/discomfort, fatigue, irregular heart beat, lower extremity edema, near-syncope, orthopnea, palpitations, paroxysmal nocturnal dyspnea, syncope and tachypnea.   Cardiovascular risk factors include advanced age (older than 6 for men, 5 for women), dyslipidemia, hypertension, male gender and obesity (BMI >= 30 kg/m2).   Wt Readings from Last 3 Encounters:  07/26/17 240 lb (108.9 kg)  01/19/17 235 lb 6.4 oz (106.8 kg)  01/19/17 235 lb 6.4 oz (106.8 kg)   ------------------------------------------------------------------------ Hyperglycemia- last a1c 6.0. On 01/19/17    No Known Allergies   Current Outpatient Prescriptions:  .  ALPRAZolam (XANAX) 0.5 MG tablet, Take 1 tablet (0.5 mg total) by mouth 2 (two) times daily as needed for anxiety., Disp: 60 tablet, Rfl: 5 .  aspirin 81 MG tablet, Take by mouth., Disp: , Rfl:  .  atorvastatin (LIPITOR) 40 MG tablet, Take 1 tablet by mouth at  bedtime, Disp: 90 tablet, Rfl: 3 .  cholecalciferol (VITAMIN D) 1000 UNITS tablet, Take by mouth., Disp: , Rfl:  .   glucosamine-chondroitin 500-400 MG tablet, Take 1 tablet by mouth 3 (three) times daily., Disp: , Rfl:  .  Olmesartan-Amlodipine-HCTZ 40-10-25 MG TABS, TAKE 1 TABLET BY MOUTH DAILY, Disp: 90 tablet, Rfl: 3  Current Facility-Administered Medications:  .  chlorpheniramine-HYDROcodone (TUSSIONEX) 10-8 MG/5ML suspension 5 mL, 5 mL, Oral, Q12H, Maple Hudson., MD  Review of Systems  Constitutional: Negative.   HENT: Negative.   Eyes: Negative.   Respiratory: Negative.   Cardiovascular: Negative.   Gastrointestinal: Negative.   Endocrine: Negative.   Genitourinary: Negative.   Musculoskeletal: Negative.   Skin: Negative.   Allergic/Immunologic: Negative.   Neurological: Negative.   Hematological: Negative.   Psychiatric/Behavioral: Negative.     Social History  Substance Use Topics  . Smoking status: Never Smoker  . Smokeless tobacco: Current User    Types: Snuff     Comment: uses smokeless tobacco "dip"  . Alcohol use 7.2 - 8.4 oz/week    12 - 14 Cans of beer per week   Objective:   BP 124/64 (BP Location: Left Arm, Patient Position: Sitting, Cuff Size: Large)   Pulse (!) 50   Temp 97.9 F (36.6 C) (Oral)   Resp 16   Wt 240 lb (108.9 kg)   BMI 35.44 kg/m    Vitals:   07/26/17 0857  BP: 124/64  Pulse: (!) 50  Resp: 16  Temp: 97.9 F (36.6 C)  TempSrc: Oral  Weight: 240 lb (108.9 kg)     Physical Exam  Constitutional: He is oriented to person, place, and time. He appears well-developed  and well-nourished.  Eyes: Pupils are equal, round, and reactive to light. Conjunctivae and EOM are normal.  Neck: Normal range of motion. Neck supple.  Cardiovascular: Normal rate, regular rhythm, normal heart sounds and intact distal pulses.   Pulmonary/Chest: Effort normal and breath sounds normal.  Musculoskeletal: Normal range of motion.  Neurological: He is alert and oriented to person, place, and time. He has normal reflexes.  Skin: Skin is warm and dry.    Psychiatric: He has a normal mood and affect. His behavior is normal. Judgment and thought content normal.        Assessment & Plan:     1. Hyperglycemia  - POCT HgB A1C 6.2 worse. Discuss diet and exercise.    2. Need for influenza vaccination  - Flu vaccine HIGH DOSE PF  3. Essential hypertension  - CBC with Differential/Platelet - TSH  4. Mixed hyperlipidemia  - Lipid Panel With LDL/HDL Ratio - Comprehensive metabolic panel     HPI, Exam, and A&P Transcribed under the direction and in the presence of Lorenda Grecco L. Wendelyn Breslow, MD  Electronically Signed: Silvio Pate, CMA  I have done the exam and reviewed the above chart and it is accurate to the best of my knowledge. Dentist has been used in this note in any air is in the dictation or transcription are unintentional.  Megan Mans, MD  [facility]

## 2017-07-27 LAB — COMPREHENSIVE METABOLIC PANEL
A/G RATIO: 1.9 (ref 1.2–2.2)
ALT: 72 IU/L — AB (ref 0–44)
AST: 47 IU/L — ABNORMAL HIGH (ref 0–40)
Albumin: 4.8 g/dL (ref 3.6–4.8)
Alkaline Phosphatase: 74 IU/L (ref 39–117)
BUN/Creatinine Ratio: 17 (ref 10–24)
BUN: 19 mg/dL (ref 8–27)
Bilirubin Total: 0.6 mg/dL (ref 0.0–1.2)
CALCIUM: 9.9 mg/dL (ref 8.6–10.2)
CO2: 26 mmol/L (ref 20–29)
CREATININE: 1.09 mg/dL (ref 0.76–1.27)
Chloride: 101 mmol/L (ref 96–106)
GFR calc Af Amer: 80 mL/min/{1.73_m2} (ref >59)
GFR calc non Af Amer: 69 mL/min/{1.73_m2} (ref >59)
GLOBULIN, TOTAL: 2.5 g/dL (ref 1.5–4.5)
Glucose: 124 mg/dL — ABNORMAL HIGH (ref 65–99)
POTASSIUM: 4.9 mmol/L (ref 3.5–5.2)
Sodium: 141 mmol/L (ref 134–144)
TOTAL PROTEIN: 7.3 g/dL (ref 6.0–8.5)

## 2017-07-27 LAB — CBC WITH DIFFERENTIAL/PLATELET
Basophils Absolute: 0 10*3/uL (ref 0.0–0.2)
Basos: 0 %
EOS (ABSOLUTE): 0.4 10*3/uL (ref 0.0–0.4)
Eos: 5 %
Hematocrit: 43.3 % (ref 37.5–51.0)
Hemoglobin: 15 g/dL (ref 13.0–17.7)
IMMATURE GRANULOCYTES: 0 %
Immature Grans (Abs): 0 10*3/uL (ref 0.0–0.1)
Lymphocytes Absolute: 1.7 10*3/uL (ref 0.7–3.1)
Lymphs: 22 %
MCH: 32.3 pg (ref 26.6–33.0)
MCHC: 34.6 g/dL (ref 31.5–35.7)
MCV: 93 fL (ref 79–97)
MONOS ABS: 0.6 10*3/uL (ref 0.1–0.9)
Monocytes: 7 %
NEUTROS PCT: 66 %
Neutrophils Absolute: 5.1 10*3/uL (ref 1.4–7.0)
PLATELETS: 223 10*3/uL (ref 150–379)
RBC: 4.64 x10E6/uL (ref 4.14–5.80)
RDW: 14.2 % (ref 12.3–15.4)
WBC: 7.9 10*3/uL (ref 3.4–10.8)

## 2017-07-27 LAB — LIPID PANEL WITH LDL/HDL RATIO
Cholesterol, Total: 130 mg/dL (ref 100–199)
HDL: 45 mg/dL (ref >39)
LDL Calculated: 66 mg/dL (ref 0–99)
LDl/HDL Ratio: 1.5 ratio (ref 0.0–3.6)
TRIGLYCERIDES: 95 mg/dL (ref 0–149)
VLDL Cholesterol Cal: 19 mg/dL (ref 5–40)

## 2017-07-27 LAB — TSH: TSH: 1.86 u[IU]/mL (ref 0.450–4.500)

## 2017-08-06 ENCOUNTER — Other Ambulatory Visit: Payer: Self-pay | Admitting: Family Medicine

## 2017-08-21 ENCOUNTER — Encounter: Payer: Self-pay | Admitting: Family Medicine

## 2017-08-21 ENCOUNTER — Ambulatory Visit (INDEPENDENT_AMBULATORY_CARE_PROVIDER_SITE_OTHER): Payer: Medicare Other | Admitting: Family Medicine

## 2017-08-21 VITALS — BP 140/66 | HR 60 | Temp 98.2°F | Resp 16 | Wt 236.0 lb

## 2017-08-21 DIAGNOSIS — J069 Acute upper respiratory infection, unspecified: Secondary | ICD-10-CM | POA: Diagnosis not present

## 2017-08-21 MED ORDER — HYDROCOD POLST-CPM POLST ER 10-8 MG/5ML PO SUER: 5.0000 mL | Freq: Two times a day (BID) | ORAL | 0 refills | Status: DC | PRN

## 2017-08-21 NOTE — Progress Notes (Signed)
Subjective:     Patient ID: MAEJOR ERVEN, male   DOB: Sep 01, 1951, 66 y.o.   MRN: [id number]  HPI  Chief Complaint  Patient presents with  . Cough    Patient comes in office today with complaints of cough and shortness of breath for the past 7days or more. Patient states that cough is productive of clear/brownish like phlegm and he has had difficulty sleeping through the night. Associated with cough patient complains of shortness of breath and runny nose, he has been taking otc Mucinex D and Ibuprofen.   Reports transient sore throat and clear sinus drainage. Both grandchild and wife are ill as well.   Review of Systems     Objective:   Physical Exam  Constitutional: He appears well-developed and well-nourished. No distress.  Ears: T.M's intact without inflammation Sinuses: non-tender Throat: no tonsillar enlargement or exudate Neck: no cervical adenopathy Lungs: clear, deep inspiration provokes cough     Assessment:    1. Viral upper respiratory tract infection - chlorpheniramine-HYDROcodone (TUSSIONEX PENNKINETIC ER) 10-8 MG/5ML SUER; Take 5 mLs by mouth every 12 (twelve) hours as needed for cough.  Dispense: 60 mL; Refill: 0    Plan:    Continue Mucinex. Call if not improving over the course of the next week.

## 2017-08-21 NOTE — Patient Instructions (Signed)
Continue Mucinex. Let me know if not continuing to improve over the next week.

## 2017-09-08 ENCOUNTER — Other Ambulatory Visit: Payer: Self-pay | Admitting: Family Medicine

## 2017-09-22 ENCOUNTER — Other Ambulatory Visit: Payer: Self-pay | Admitting: Family Medicine

## 2017-09-22 DIAGNOSIS — I10 Essential (primary) hypertension: Secondary | ICD-10-CM

## 2017-12-20 ENCOUNTER — Telehealth: Payer: Self-pay | Admitting: Family Medicine

## 2017-12-20 NOTE — Telephone Encounter (Signed)
Pt returned call. Pt requested that he have both the appt on the same day. AWV with [facility] is scheduled 01/25/18 @ 8:40 am and CPE/AWV F/U with Dr. Sullivan LoneGilbert is @ 9:20 am. Thanks TNP

## 2018-01-25 ENCOUNTER — Ambulatory Visit (INDEPENDENT_AMBULATORY_CARE_PROVIDER_SITE_OTHER): Payer: Medicare Other

## 2018-01-25 ENCOUNTER — Encounter: Payer: Self-pay | Admitting: Family Medicine

## 2018-01-25 ENCOUNTER — Ambulatory Visit (INDEPENDENT_AMBULATORY_CARE_PROVIDER_SITE_OTHER): Payer: Medicare Other | Admitting: Family Medicine

## 2018-01-25 VITALS — BP 128/64 | HR 60 | Temp 98.5°F | Resp 16 | Ht 70.0 in | Wt 237.0 lb

## 2018-01-25 VITALS — BP 128/64 | HR 60 | Temp 98.5°F | Ht 69.0 in

## 2018-01-25 DIAGNOSIS — Z0000 Encounter for general adult medical examination without abnormal findings: Secondary | ICD-10-CM

## 2018-01-25 DIAGNOSIS — E782 Mixed hyperlipidemia: Secondary | ICD-10-CM

## 2018-01-25 DIAGNOSIS — I10 Essential (primary) hypertension: Secondary | ICD-10-CM

## 2018-01-25 DIAGNOSIS — Z1211 Encounter for screening for malignant neoplasm of colon: Secondary | ICD-10-CM

## 2018-01-25 LAB — POCT URINALYSIS DIPSTICK
Bilirubin, UA: NEGATIVE
Blood, UA: NEGATIVE
GLUCOSE UA: NEGATIVE
Ketones, UA: NEGATIVE
LEUKOCYTES UA: NEGATIVE
Nitrite, UA: NEGATIVE
Protein, UA: NEGATIVE
Spec Grav, UA: 1.025 (ref 1.010–1.025)
Urobilinogen, UA: 0.2 E.U./dL
pH, UA: 6 (ref 5.0–8.0)

## 2018-01-25 LAB — IFOBT (OCCULT BLOOD): IMMUNOLOGICAL FECAL OCCULT BLOOD TEST: NEGATIVE

## 2018-01-25 NOTE — Progress Notes (Signed)
Subjective:   Christian FleetingStephen L [name] is a 67 y.o. male who presents for Medicare Annual/Subsequent preventive examination.  Review of Systems:  N/A  Cardiac Risk Factors include: advanced age (>8255men, 54>65 women);dyslipidemia;hypertension;male gender;obesity (BMI >30kg/m2);smoking/ tobacco exposure     Objective:    Vitals: BP 128/64 (BP Location: Left Arm)   Pulse 60   Temp 98.5 F (36.9 C) (Oral)   Ht 5\' 9"  (1.753 m)   BMI 34.85 kg/m   Body mass index is 34.85 kg/m.  Advanced Directives 01/25/2018 01/19/2017 01/07/2016  Does Patient Have a Medical Advance Directive? No No No  Would patient like information on creating a medical advance directive? - No - Patient declined -    Tobacco Social History   Tobacco Use  Smoking Status Never Smoker  Smokeless Tobacco Current User  . Types: Snuff  Tobacco Comment   uses smokeless tobacco "dip"     Ready to quit: Not Answered Counseling given: Not Answered Comment: uses smokeless tobacco "dip"   Clinical Intake:  Pre-visit preparation completed: Yes  Pain : No/denies pain Pain Score: 0-No pain     Nutritional Status: BMI > 30  Obese Nutritional Risks: None Diabetes: No  How often do you need to have someone help you when you read instructions, pamphlets, or other written materials from your doctor or pharmacy?: 1 - Never  Interpreter Needed?: No  Information entered by :: [facility], LPN  Past Medical History:  Diagnosis Date  . Anxiety   . Bradycardia   . Hyperlipidemia   . Hypertension    Past Surgical History:  Procedure Laterality Date  . INGUINAL HERNIA REPAIR Left   . vasectomy  [id number]   Family History  Problem Relation Age of Onset  . Dementia Mother   . Colon cancer Father   . Hypertension Sister   . Hypertension Son    Social History   Socioeconomic History  . Marital status: Married    Spouse name: None  . Number of children: 3  . Years of education: None  . Highest education level: 12th  grade  Social Needs  . Financial resource strain: Not hard at all  . Food insecurity - worry: Never true  . Food insecurity - inability: Never true  . Transportation needs - medical: No  . Transportation needs - non-medical: No  Occupational History  . 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 Smoking status: Never Smoker  . Smokeless tobacco: Current User    Types: Snuff  . Tobacco comment: uses smokeless tobacco "dip"  Substance and Sexual Activity  . Alcohol use: Yes    Alcohol/week: 4.8 - 6.0 oz    Types: 8 - 10 Cans of beer per week  . Drug use: No  . Sexual activity: None  Other Topics Concern  . None  Social History Narrative  . None    Outpatient Encounter Medications as of 01/25/2018  Medication Sig  . ALPRAZolam (XANAX) 0.5 MG tablet TAKE 1 TABLET BY MOUTH TWICE DAILY AS NEEDED FOR ANXIETY  . aspirin 81 MG tablet Take 81 mg by mouth daily.   Marland Kitchen. atorvastatin (LIPITOR) 40 MG tablet TAKE 1 TABLET BY MOUTH AT  BEDTIME  . cholecalciferol (VITAMIN D) 1000 UNITS tablet Take 1,000 Units by mouth daily.   Marland Kitchen. glucosamine-chondroitin 500-400 MG tablet Take 1 tablet by mouth daily.   . Olmesartan-Amlodipine-HCTZ 40-10-25 MG TABS TAKE 1 TABLET BY MOUTH DAILY  .  chlorpheniramine-HYDROcodone (TUSSIONEX PENNKINETIC ER) 10-8 MG/5ML SUER Take 5 mLs by mouth every 12 (twelve) hours as needed for cough. (Patient not taking: Reported on 01/25/2018)   No facility-administered encounter medications on file as of 01/25/2018.     Activities of Daily Living In your present state of health, do you have any difficulty performing the following activities: 01/25/2018  Hearing? N  Vision? N  Difficulty concentrating or making decisions? N  Walking or climbing stairs? N  Dressing or bathing? N  Doing errands, shopping? N  Preparing Food and eating ? N  Using the Toilet? N  In the past six months, have you accidently leaked urine? N  Do you have problems  with loss of bowel control? N  Managing your Medications? N  Managing your Finances? N  Housekeeping or managing your Housekeeping? N  Some recent data might be hidden    Patient Care Team: Maple Hudson., MD as PCP - General (Family Medicine) Dingeldein, Viviann Spare, MD as Consulting Physician (Ophthalmology) Lemar Livings Merrily Pew, MD as Consulting Physician (General Surgery)   Assessment:   This is a routine wellness examination for Christian [name].  Exercise Activities and Dietary recommendations Current Exercise Habits: Home exercise routine, Type of exercise: Other - see comments(slim cycle), Time (Minutes): 20, Frequency (Times/Week): 5, Weekly Exercise (Minutes/Week): 100, Intensity: Mild  Goals    . DIET - REDUCE PORTION SIZE     Recommend decreasing portion sizes by half and eating 3 small meals a day with 2 healthy snacks in between.        Fall Risk Fall Risk  01/25/2018 01/19/2017 01/07/2016 09/09/2015  Falls in the past year? No No No No   Is the patient's home free of loose throw rugs in walkways, pet beds, electrical cords, etc?   yes      Grab bars in the bathroom? yes      Handrails on the stairs?   yes      Adequate lighting?   yes  Timed Get Up and Go Performed: N/A  Depression Screen PHQ 2/9 Scores 01/25/2018 01/19/2017 01/19/2017 01/07/2016  PHQ - 2 Score 0 0 0 0  PHQ- 9 Score - 0 - -    Cognitive Function: Pt declined screening today.      6CIT Screen 01/19/2017  What Year? 0 points  What month? 0 points  What time? 0 points  Count back from 20 0 points  Months in reverse 2 points  Repeat phrase 2 points  Total Score 4    Immunization History  Administered Date(s) Administered  . Influenza Split 08/23/2012  . Influenza, High Dose Seasonal PF 09/09/2015, 07/14/2016, 07/26/2017  . Influenza,inj,Quad PF,6+ Mos 09/07/2013  . Pneumococcal Conjugate-13 01/01/2015  . Pneumococcal Polysaccharide-23 01/07/2016  . Tdap 07/29/2011  . Zoster 09/07/2013     Qualifies for Shingles Vaccine? Due for Shingles vaccine. Declined my offer to administer today. Education has been provided regarding the importance of this vaccine. Pt has been advised to call her insurance company to determine her out of pocket expense. Advised she may also receive this vaccine at her local pharmacy or Health Dept. Verbalized acceptance and understanding.  Screening Tests Health Maintenance  Topic Date Due  . COLONOSCOPY  10/02/2019  . TETANUS/TDAP  07/28/2021  . INFLUENZA VACCINE  Completed  . Hepatitis C Screening  Completed  . PNA vac Low Risk Adult  Completed   Cancer Screenings: Lung: Low Dose CT Chest recommended if Age 28-80 years, 30 pack-year currently smoking  OR have quit w/in 15years. Patient does not qualify. Colorectal: Up to date  Additional Screenings:  Hepatitis C Screening: Up to date    Plan:  I have personally reviewed and addressed the Medicare Annual Wellness questionnaire and have noted the following in the patient's chart:  A. Medical and social history B. Use of alcohol, tobacco or illicit drugs  C. Current medications and supplements D. Functional ability and status E.  Nutritional status F.  Physical activity G. Advance directives H. List of other physicians I.  Hospitalizations, surgeries, and ER visits in previous 12 months J.  Vitals K. Screenings such as hearing and vision if needed, cognitive and depression L. Referrals and appointments - none  In addition, I have reviewed and discussed with patient certain preventive protocols, quality metrics, and best practice recommendations. A written personalized care plan for preventive services as well as general preventive health recommendations were provided to patient.  See attached scanned questionnaire for additional information.   Signed,  Hyacinth Meeker, LPN Nurse Health Advisor   Nurse Recommendations: None.

## 2018-01-25 NOTE — Progress Notes (Signed)
Patient: Christian [name], Male    DOB: 04/01/1951, 67 y.o.   MRN: [id number] Visit Date: 01/25/2018  Today's Provider: Megan Mans, MD   Chief Complaint  Patient presents with  . Annual Exam   Subjective:  Christian [name] is a 67 y.o. male who presents today for health maintenance and complete physical. He feels well. He reports exercising daily. He reports he is sleeping well.  Patient was seen by nurse health adviser earlier today.  Immunization History  Administered Date(s) Administered  . Influenza Split 08/23/2012  . Influenza, High Dose Seasonal PF 09/09/2015, 07/14/2016, 07/26/2017  . Influenza,inj,Quad PF,6+ Mos 09/07/2013  . Pneumococcal Conjugate-13 01/01/2015  . Pneumococcal Polysaccharide-23 01/07/2016  . Tdap 07/29/2011  . Zoster 09/07/2013   10/01/2009 Colonoscopy, Byrnett-Diverticulosis, sessile polyps.  Path report not in our records  Review of Systems  Constitutional: Negative.   HENT: Negative.   Eyes: Negative.   Respiratory: Negative.   Cardiovascular: Negative.   Gastrointestinal: Negative.   Endocrine: Negative.   Genitourinary: Negative.   Musculoskeletal: Negative.   Skin: Negative.   Allergic/Immunologic: Negative.   Neurological: Negative.   Hematological: Negative.   Psychiatric/Behavioral: Negative.    Social History   Socioeconomic History  . Marital status: Married    Spouse name: Not on file  . Number of children: 3  . Years of education: Not on file  . Highest education level: 12th grade  Social Needs  . Financial resource strain: Not hard at all  . Food insecurity - worry: Never true  . Food insecurity - inability: Never true  . Transportation needs - medical: No  . Transportation needs - non-medical: No  Occupational History  . 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 Smoking status: Never Smoker  . Smokeless tobacco: Current User    Types: Snuff  . Tobacco comment:  uses smokeless tobacco "dip"  Substance and Sexual Activity  . Alcohol use: Yes    Alcohol/week: 4.8 - 6.0 oz    Types: 8 - 10 Cans of beer per week  . Drug use: No  . Sexual activity: Not on file  Other Topics Concern  . Not on file  Social History Narrative  . Not on file    Patient Active Problem List   Diagnosis Date Noted  . Hypercalcemia 07/01/2015  . Alcohol drinker 05/08/2015  . Anxiety 05/08/2015  . Depression, major, single episode, mild (HCC) 05/08/2015  . Acid reflux 05/08/2015  . Difficulty hearing 05/08/2015  . Blood glucose elevated 05/08/2015  . HLD (hyperlipidemia) 05/08/2015  . BP (high blood pressure) 05/08/2015  . Cannot sleep 05/08/2015  . Osteoarthrosis 05/08/2015  . Avitaminosis D 05/08/2015    Past Surgical History:  Procedure Laterality Date  . INGUINAL HERNIA REPAIR Left   . vasectomy  1978    His family history includes Colon cancer in his father; Dementia in his mother; Hypertension in his sister and son.     Outpatient Encounter Medications as of 01/25/2018  Medication Sig Note  . ALPRAZolam (XANAX) 0.5 MG tablet TAKE 1 TABLET BY MOUTH TWICE DAILY AS NEEDED FOR ANXIETY   . aspirin 81 MG tablet Take 81 mg by mouth daily.  05/08/2015: Received from: [facility]  . atorvastatin (LIPITOR) 40 MG tablet TAKE 1 TABLET BY MOUTH AT  BEDTIME   . chlorpheniramine-HYDROcodone (TUSSIONEX PENNKINETIC ER) 10-8 MG/5ML SUER Take 5 mLs by mouth every 12 (twelve) hours as needed  for cough.   . cholecalciferol (VITAMIN D) 1000 UNITS tablet Take 1,000 Units by mouth daily.  05/08/2015: [facility]  . glucosamine-chondroitin 500-400 MG tablet Take 1 tablet by mouth daily.    . Olmesartan-Amlodipine-HCTZ 40-10-25 MG TABS TAKE 1 TABLET BY MOUTH DAILY    No facility-administered encounter medications on file as of 01/25/2018.     Patient Care Team: Maple HudsonGilbert, Marcelyn Ruppe L Jr., MD as PCP - General (Family  Medicine) Dingeldein, Viviann SpareSteven, MD as Consulting Physician (Ophthalmology) Lemar LivingsByrnett, Merrily PewJeffrey W, MD as Consulting Physician (General Surgery)      Objective:   Vitals:  Vitals:   01/25/18 0903  BP: 128/64  Pulse: 60  Resp: 16  Temp: 98.5 F (36.9 C)  Weight: 237 lb (107.5 kg)  Height: 5\' 10"  (1.778 m)    Physical Exam  Constitutional: He is oriented to person, place, and time. He appears well-developed and well-nourished.  HENT:  Head: Normocephalic and atraumatic.  Right Ear: External ear normal.  Left Ear: External ear normal.  Nose: Nose normal.  Mouth/Throat: Oropharynx is clear and moist.  Eyes: Conjunctivae and EOM are normal. Pupils are equal, round, and reactive to light.  Neck: Normal range of motion. Neck supple.  Cardiovascular: Normal rate, regular rhythm, normal heart sounds and intact distal pulses.  Pulmonary/Chest: Effort normal and breath sounds normal.  Abdominal: Soft. Bowel sounds are normal.  Genitourinary: Rectum normal, prostate normal and penis normal.  Musculoskeletal: Normal range of motion.  Neurological: He is alert and oriented to person, place, and time.  Skin: Skin is warm and dry.  Psychiatric: He has a normal mood and affect. His behavior is normal. Judgment and thought content normal.    Assessment & Plan:     Routine Health Maintenance and Physical Exam  Exercise Activities and Dietary recommendations Goals    . DIET - REDUCE PORTION SIZE     Recommend decreasing portion sizes by half and eating 3 small meals a day with 2 healthy snacks in between.        Immunization History  Administered Date(s) Administered  . Influenza Split 08/23/2012  . Influenza, High Dose Seasonal PF 09/09/2015, 07/14/2016, 07/26/2017  . Influenza,inj,Quad PF,6+ Mos 09/07/2013  . Pneumococcal Conjugate-13 01/01/2015  . Pneumococcal Polysaccharide-23 01/07/2016  . Tdap 07/29/2011  . Zoster 09/07/2013    Health Maintenance  Topic Date Due  .  COLONOSCOPY  10/02/2019  . TETANUS/TDAP  07/28/2021  . INFLUENZA VACCINE  Completed  . Hepatitis C Screening  Completed  . PNA vac Low Risk Adult  Completed    Colonoscopy 2020. Discussed health benefits of physical activity, and encouraged him to engage in regular exercise appropriate for his age and condition.    I have done the exam and reviewed the chart and it is accurate to the best of my knowledge. [employer]ist[employer]  technology has been used and  any errors in dictation or transcription are unintentional. Julieanne Mansonichard Kylin Dubs M.D. [facility]

## 2018-01-25 NOTE — Patient Instructions (Signed)
[name] , Thank you for taking time to come for your Medicare Wellness Visit. I appreciate your ongoing commitment to your health goals. Please review the following plan we discussed and let me know if I can assist you in the future.   Screening recommendations/referrals: Colonoscopy: Up to date Recommended yearly ophthalmology/optometry visit for glaucoma screening and checkup Recommended yearly dental visit for hygiene and checkup  Vaccinations: Influenza vaccine: Up to date Pneumococcal vaccine: Up to date Tdap vaccine: Up to date Shingles vaccine: Pt declines today.     Advanced directives: Please bring a copy of your POA (Power of Attorney) and/or Living Will to your next appointment.   Conditions/risks identified: Smoking cessation; Obesity- recommend decreasing portion sizes by half and eating 3 small meals a day with 2 healthy snacks in between.   Next appointment: 9:20 AM today with Dr Christian [name].   Preventive Care 465 Years and Older, Male Preventive care refers to lifestyle choices and visits with your health care provider that can promote health and wellness. What does preventive care include?  A yearly physical exam. This is also called an annual well check.  Dental exams once or twice a year.  Routine eye exams. Ask your health care provider how often you should have your eyes checked.  Personal lifestyle choices, including:  Daily care of your teeth and gums.  Regular physical activity.  Eating a healthy diet.  Avoiding tobacco and drug use.  Limiting alcohol use.  Practicing safe sex.  Taking low doses of aspirin every day.  Taking vitamin and mineral supplements as recommended by your health care provider. What happens during an annual well check? The services and screenings done by your health care provider during your annual well check will depend on your age, overall health, lifestyle risk factors, and family history of disease. Counseling  Your  health care provider may ask you questions about your:  Alcohol use.  Tobacco use.  Drug use.  Emotional well-being.  Home and relationship well-being.  Sexual activity.  Eating habits.  History of falls.  Memory and ability to understand (cognition).  Work and work Astronomerenvironment. Screening  You may have the following tests or measurements:  Height, weight, and BMI.  Blood pressure.  Lipid and cholesterol levels. These may be checked every 5 years, or more frequently if you are over 69 years old.  Skin check.  Lung cancer screening. You may have this screening every year starting at age 69 if you have a 30-pack-year history of smoking and currently smoke or have quit within the past 15 years.  Fecal occult blood test (FOBT) of the stool. You may have this test every year starting at age 69.  Flexible sigmoidoscopy or colonoscopy. You may have a sigmoidoscopy every 5 years or a colonoscopy every 10 years starting at age 69.  Prostate cancer screening. Recommendations will vary depending on your family history and other risks.  Hepatitis C blood test.  Hepatitis B blood test.  Sexually transmitted disease (STD) testing.  Diabetes screening. This is done by checking your blood sugar (glucose) after you have not eaten for a while (fasting). You may have this done every 1-3 years.  Abdominal aortic aneurysm (AAA) screening. You may need this if you are a current or former smoker.  Osteoporosis. You may be screened starting at age 69 if you are at high risk. Talk with your health care provider about your test results, treatment options, and if necessary, the need for more tests.  Vaccines  Your health care provider may recommend certain vaccines, such as:  Influenza vaccine. This is recommended every year.  Tetanus, diphtheria, and acellular pertussis (Tdap, Td) vaccine. You may need a Td booster every 10 years.  Zoster vaccine. You may need this after age  35.  Pneumococcal 13-valent conjugate (PCV13) vaccine. One dose is recommended after age 45.  Pneumococcal polysaccharide (PPSV23) vaccine. One dose is recommended after age 34. Talk to your health care provider about which screenings and vaccines you need and how often you need them. This information is not intended to replace advice given to you by your health care provider. Make sure you discuss any questions you have with your health care provider. Document Released: 11/22/2015 Document Revised: 07/15/2016 Document Reviewed: 08/27/2015 Elsevier Interactive Patient Education  2017 [place] Prevention in the Home Falls can cause injuries. They can happen to people of all ages. There are many things you can do to make your home safe and to help prevent falls. What can I do on the outside of my home?  Regularly fix the edges of walkways and driveways and fix any cracks.  Remove anything that might make you trip as you walk through a door, such as a raised step or threshold.  Trim any bushes or trees on the path to your home.  Use bright outdoor lighting.  Clear any walking paths of anything that might make someone trip, such as rocks or tools.  Regularly check to see if handrails are loose or broken. Make sure that both sides of any steps have handrails.  Any raised decks and porches should have guardrails on the edges.  Have any leaves, snow, or ice cleared regularly.  Use sand or salt on walking paths during winter.  Clean up any spills in your garage right away. This includes oil or grease spills. What can I do in the bathroom?  Use night lights.  Install grab bars by the toilet and in the tub and shower. Do not use towel bars as grab bars.  Use non-skid mats or decals in the tub or shower.  If you need to sit down in the shower, use a plastic, non-slip stool.  Keep the floor dry. Clean up any water that spills on the floor as soon as it happens.  Remove  soap buildup in the tub or shower regularly.  Attach bath mats securely with double-sided non-slip rug tape.  Do not have throw rugs and other things on the floor that can make you trip. What can I do in the bedroom?  Use night lights.  Make sure that you have a light by your bed that is easy to reach.  Do not use any sheets or blankets that are too big for your bed. They should not hang down onto the floor.  Have a firm chair that has side arms. You can use this for support while you get dressed.  Do not have throw rugs and other things on the floor that can make you trip. What can I do in the kitchen?  Clean up any spills right away.  Avoid walking on wet floors.  Keep items that you use a lot in easy-to-reach places.  If you need to reach something above you, use a strong step stool that has a grab bar.  Keep electrical cords out of the way.  Do not use floor polish or wax that makes floors slippery. If you must use wax, use non-skid floor wax.  Do not have throw rugs and other things on the floor that can make you trip. What can I do with my stairs?  Do not leave any items on the stairs.  Make sure that there are handrails on both sides of the stairs and use them. Fix handrails that are broken or loose. Make sure that handrails are as long as the stairways.  Check any carpeting to make sure that it is firmly attached to the stairs. Fix any carpet that is loose or worn.  Avoid having throw rugs at the top or bottom of the stairs. If you do have throw rugs, attach them to the floor with carpet tape.  Make sure that you have a light switch at the top of the stairs and the bottom of the stairs. If you do not have them, ask someone to add them for you. What else can I do to help prevent falls?  Wear shoes that:  Do not have high heels.  Have rubber bottoms.  Are comfortable and fit you well.  Are closed at the toe. Do not wear sandals.  If you use a  stepladder:  Make sure that it is fully opened. Do not climb a closed stepladder.  Make sure that both sides of the stepladder are locked into place.  Ask someone to hold it for you, if possible.  Clearly mark and make sure that you can see:  Any grab bars or handrails.  First and last steps.  Where the edge of each step is.  Use tools that help you move around (mobility aids) if they are needed. These include:  Canes.  Walkers.  Scooters.  Crutches.  Turn on the lights when you go into a dark area. Replace any light bulbs as soon as they burn out.  Set up your furniture so you have a clear path. Avoid moving your furniture around.  If any of your floors are uneven, fix them.  If there are any pets around you, be aware of where they are.  Review your medicines with your doctor. Some medicines can make you feel dizzy. This can increase your chance of falling. Ask your doctor what other things that you can do to help prevent falls. This information is not intended to replace advice given to you by your health care provider. Make sure you discuss any questions you have with your health care provider. Document Released: 08/22/2009 Document Revised: 04/02/2016 Document Reviewed: 11/30/2014 Elsevier Interactive Patient Education  2017 [employer].

## 2018-01-26 ENCOUNTER — Telehealth: Payer: Self-pay

## 2018-01-26 LAB — COMPREHENSIVE METABOLIC PANEL
A/G RATIO: 1.9 (ref 1.2–2.2)
ALBUMIN: 4.7 g/dL (ref 3.6–4.8)
ALT: 56 IU/L — ABNORMAL HIGH (ref 0–44)
AST: 34 IU/L (ref 0–40)
Alkaline Phosphatase: 79 IU/L (ref 39–117)
BUN / CREAT RATIO: 16 (ref 10–24)
BUN: 17 mg/dL (ref 8–27)
Bilirubin Total: 0.6 mg/dL (ref 0.0–1.2)
CALCIUM: 9.7 mg/dL (ref 8.6–10.2)
CO2: 23 mmol/L (ref 20–29)
Chloride: 102 mmol/L (ref 96–106)
Creatinine, Ser: 1.04 mg/dL (ref 0.76–1.27)
GFR, EST AFRICAN AMERICAN: 85 mL/min/{1.73_m2} (ref >59)
GFR, EST NON AFRICAN AMERICAN: 73 mL/min/{1.73_m2} (ref >59)
GLOBULIN, TOTAL: 2.5 g/dL (ref 1.5–4.5)
Glucose: 130 mg/dL — ABNORMAL HIGH (ref 65–99)
POTASSIUM: 4.3 mmol/L (ref 3.5–5.2)
SODIUM: 142 mmol/L (ref 134–144)
Total Protein: 7.2 g/dL (ref 6.0–8.5)

## 2018-01-26 LAB — CBC WITH DIFFERENTIAL/PLATELET
BASOS: 0 %
Basophils Absolute: 0 10*3/uL (ref 0.0–0.2)
EOS (ABSOLUTE): 0.2 10*3/uL (ref 0.0–0.4)
EOS: 3 %
HEMATOCRIT: 43 % (ref 37.5–51.0)
HEMOGLOBIN: 14.8 g/dL (ref 13.0–17.7)
IMMATURE GRANULOCYTES: 0 %
Immature Grans (Abs): 0 10*3/uL (ref 0.0–0.1)
LYMPHS ABS: 1.4 10*3/uL (ref 0.7–3.1)
Lymphs: 18 %
MCH: 31.5 pg (ref 26.6–33.0)
MCHC: 34.4 g/dL (ref 31.5–35.7)
MCV: 92 fL (ref 79–97)
MONOS ABS: 0.4 10*3/uL (ref 0.1–0.9)
Monocytes: 6 %
NEUTROS PCT: 73 %
Neutrophils Absolute: 5.7 10*3/uL (ref 1.4–7.0)
Platelets: 212 10*3/uL (ref 150–379)
RBC: 4.7 x10E6/uL (ref 4.14–5.80)
RDW: 13.9 % (ref 12.3–15.4)
WBC: 7.8 10*3/uL (ref 3.4–10.8)

## 2018-01-26 LAB — LIPID PANEL WITH LDL/HDL RATIO
Cholesterol, Total: 140 mg/dL (ref 100–199)
HDL: 39 mg/dL — ABNORMAL LOW (ref >39)
LDL CALC: 77 mg/dL (ref 0–99)
LDL/HDL RATIO: 2 ratio (ref 0.0–3.6)
Triglycerides: 118 mg/dL (ref 0–149)
VLDL Cholesterol Cal: 24 mg/dL (ref 5–40)

## 2018-01-26 LAB — TSH: TSH: 1.62 u[IU]/mL (ref 0.450–4.500)

## 2018-01-26 NOTE — Telephone Encounter (Signed)
-----   Message from Maple Hudsonichard L Gilbert Jr., MD sent at 01/26/2018  2:22 PM EDT ----- D and E as pt right on border of DM.Work on weight loss.RTC 3 months or sooner.

## 2018-01-26 NOTE — Telephone Encounter (Signed)
Patient advised appt made for 04/28/18. KW

## 2018-01-31 ENCOUNTER — Ambulatory Visit
Admission: RE | Admit: 2018-01-31 | Discharge: 2018-01-31 | Disposition: A | Discharge status: Home / Self Care | Payer: Medicare Other | Source: Ambulatory Visit | Attending: Family Medicine | Admitting: Family Medicine

## 2018-01-31 ENCOUNTER — Ambulatory Visit (INDEPENDENT_AMBULATORY_CARE_PROVIDER_SITE_OTHER): Payer: Medicare Other | Admitting: Family Medicine

## 2018-01-31 ENCOUNTER — Encounter: Payer: Self-pay | Admitting: Family Medicine

## 2018-01-31 VITALS — BP 118/64 | HR 70 | Temp 97.5°F | Resp 16 | Wt 236.8 lb

## 2018-01-31 DIAGNOSIS — B349 Viral infection, unspecified: Secondary | ICD-10-CM | POA: Diagnosis not present

## 2018-01-31 DIAGNOSIS — R918 Other nonspecific abnormal finding of lung field: Secondary | ICD-10-CM | POA: Diagnosis not present

## 2018-01-31 DIAGNOSIS — R05 Cough: Secondary | ICD-10-CM | POA: Diagnosis not present

## 2018-01-31 DIAGNOSIS — I517 Cardiomegaly: Secondary | ICD-10-CM | POA: Insufficient documentation

## 2018-01-31 DIAGNOSIS — R0602 Shortness of breath: Secondary | ICD-10-CM | POA: Diagnosis present

## 2018-01-31 MED ORDER — HYDROCOD POLST-CPM POLST ER 10-8 MG/5ML PO SUER: 5.0000 mL | Freq: Two times a day (BID) | ORAL | 0 refills | Status: DC | PRN

## 2018-01-31 NOTE — Progress Notes (Signed)
Subjective:     Patient ID: Christian [name], male   DOB: [birth year], 67 y.o.   MRN: [id number] Chief Complaint  Patient presents with  . Cough    Patient comes in office today with complaints of cough and congestion for the past 4 days. Patient complains of congestion in his chest, sinus pain, productive cough with mucous and shortness of breath. Patient has tried otc Mucinex, Ibuprofen and Robitussin.    HPI States he has had sweats and body aches but did not take his temperature at home. + flu shot.  Review of Systems     Objective:   Physical Exam  Constitutional: He appears well-developed and well-nourished. No distress.  Ears: T.M's intact without inflammation Throat: no tonsillar enlargement or exudate Neck: no cervical adenopathy Lungs: Bibasilar coarse crackles and expiratory wheezes     Assessment:    1. Acute viral syndrome - DG Chest 2 View; Future - chlorpheniramine-HYDROcodone (TUSSIONEX PENNKINETIC ER) 10-8 MG/5ML SUER; Take 5 mLs by mouth every 12 (twelve) hours as needed for cough.  Dispense: 60 mL; Refill: 0    Plan:    Further f/u pending x-ray reports. Discussed use of Mucinex D and Delsym.

## 2018-01-31 NOTE — Patient Instructions (Signed)
We will call you with the x-ray results. Discussed use of Mucinex D for congestion and Delsym for cough.

## 2018-03-05 DIAGNOSIS — H1131 Conjunctival hemorrhage, right eye: Secondary | ICD-10-CM | POA: Diagnosis not present

## 2018-03-08 DIAGNOSIS — H1131 Conjunctival hemorrhage, right eye: Secondary | ICD-10-CM | POA: Diagnosis not present

## 2018-04-22 ENCOUNTER — Other Ambulatory Visit: Payer: Self-pay | Admitting: Family Medicine

## 2018-04-28 ENCOUNTER — Ambulatory Visit: Payer: Self-pay | Admitting: Family Medicine

## 2018-06-01 ENCOUNTER — Telehealth: Payer: Self-pay

## 2018-06-01 NOTE — Telephone Encounter (Signed)
Patient called office stating that he was returning nurse call, I did not see a message in chart and explained to patient it could have been an appt reminder call for appt I see in chart for 7/29,, please advise. KW

## 2018-06-06 ENCOUNTER — Ambulatory Visit (INDEPENDENT_AMBULATORY_CARE_PROVIDER_SITE_OTHER): Payer: Medicare Other | Admitting: Family Medicine

## 2018-06-06 VITALS — BP 142/70 | HR 56 | Temp 98.3°F | Resp 16 | Wt 236.0 lb

## 2018-06-06 DIAGNOSIS — I10 Essential (primary) hypertension: Secondary | ICD-10-CM

## 2018-06-06 DIAGNOSIS — R739 Hyperglycemia, unspecified: Secondary | ICD-10-CM

## 2018-06-06 LAB — POCT GLYCOSYLATED HEMOGLOBIN (HGB A1C): Hemoglobin A1C: 6.3 % — AB (ref 4.0–5.6)

## 2018-06-06 NOTE — Progress Notes (Signed)
Christian [name]  MRN: [id number] DOB: Aug 08, 1951  Subjective:  HPI   The patient is a 67 year old male who presents fort follow up of hyperglycemia.  He was last seen for non acute visit on 01/25/18.  His last A1C was on 07/26/17 and was 6.2.  He states he "fell off the wagon" after his mother passed away in June and has had difficulty getting back on track.  He states his weight was down to 227 prior to that happening.  He does not check his glucose at home.  Hypertension-The patient has had stable blood pressure prior to today.    BP Readings from Last 3 Encounters:  06/06/18 (!) 142/70  01/31/18 118/64  01/25/18 128/64    Patient Active Problem List   Diagnosis Date Noted  . Hypercalcemia 07/01/2015  . Alcohol drinker 05/08/2015  . Anxiety 05/08/2015  . Depression, major, single episode, mild (HCC) 05/08/2015  . Acid reflux 05/08/2015  . Difficulty hearing 05/08/2015  . Blood glucose elevated 05/08/2015  . HLD (hyperlipidemia) 05/08/2015  . BP (high blood pressure) 05/08/2015  . Cannot sleep 05/08/2015  . Osteoarthrosis 05/08/2015  . Avitaminosis D 05/08/2015    Past Medical History:  Diagnosis Date  . Anxiety   . Bradycardia   . Hyperlipidemia   . Hypertension     Social History   Socioeconomic History  . Marital status: Married    Spouse name: Not on file  . Number of children: 3  . Years of education: Not on file  . Highest education level: 12th grade  Occupational History  . Occupation: retired    Comment: does work on [employer] daily  Social Needs  . Financial resource strain: Not hard at all  . Food insecurity:    Worry: Never true    Inability: Never true  . Transportation needs:    Medical: No    Non-medical: No  Tobacco Use  . Smoking status: Never Smoker  . Smokeless tobacco: Current User    Types: Snuff  . Tobacco comment: uses smokeless tobacco "dip"  Substance and Sexual Activity  . Alcohol use: Yes    Alcohol/week: 4.8 - 6.0  oz    Types: 8 - 10 Cans of beer per week  . Drug use: No  . Sexual activity: Not on file  Lifestyle  . Physical activity:    Days per week: Not on file    Minutes per session: Not on file  . Stress: Only a little  Relationships  . Social connections:    Talks on phone: Not on file    Gets together: Not on file    Attends religious service: Not on file    Active member of club or organization: Not on file    Attends meetings of clubs or organizations: Not on file    Relationship status: Not on file  . Intimate partner violence:    Fear of current or ex partner: Not on file    Emotionally abused: Not on file    Physically abused: Not on file    Forced sexual activity: Not on file  Other Topics Concern  . Not on file  Social History Narrative  . Not on file    Outpatient Encounter Medications as of 06/06/2018  Medication Sig Note  . ALPRAZolam (XANAX) 0.5 MG tablet TAKE 1 TABLET BY MOUTH TWICE DAILY AS NEEDED   . aspirin 81 MG tablet Take 81 mg by mouth daily.  05/08/2015: Received from:  Acupuncturist  . atorvastatin (LIPITOR) 40 MG tablet TAKE 1 TABLET BY MOUTH AT  BEDTIME   . cholecalciferol (VITAMIN D) 1000 UNITS tablet Take 1,000 Units by mouth daily.  05/08/2015: Received from: [employer]  . glucosamine-chondroitin 500-400 MG tablet Take 1 tablet by mouth daily.    . Olmesartan-Amlodipine-HCTZ 40-10-25 MG TABS TAKE 1 TABLET BY MOUTH DAILY   . [DISCONTINUED] chlorpheniramine-HYDROcodone (TUSSIONEX PENNKINETIC ER) 10-8 MG/5ML SUER Take 5 mLs by mouth every 12 (twelve) hours as needed for cough.    No facility-administered encounter medications on file as of 06/06/2018.     No Known Allergies  Review of Systems  Constitutional: Negative for fever and malaise/fatigue.  Eyes: Negative.   Respiratory: Negative for cough, shortness of breath and wheezing.   Cardiovascular: Negative for chest pain, palpitations, orthopnea, claudication and leg  swelling.  Gastrointestinal: Negative.   Genitourinary: Negative for frequency.  Skin: Negative.   Neurological: Negative for dizziness and headaches.  Endo/Heme/Allergies: Negative.  Negative for polydipsia.  Psychiatric/Behavioral: Negative.     Objective:  BP (!) 142/70 (BP Location: Right Arm, Patient Position: Sitting, Cuff Size: Normal)   Pulse (!) 56   Temp 98.3 F (36.8 C) (Oral)   Resp 16   Wt 236 lb (107 kg)   SpO2 98%   BMI 33.86 kg/m   Physical Exam  Constitutional: He is oriented to person, place, and time and well-developed, well-nourished, and in no distress.  HENT:  Head: Normocephalic and atraumatic.  Eyes: Conjunctivae are normal. No scleral icterus.  Neck: No thyromegaly present.  Cardiovascular: Normal rate, regular rhythm and normal heart sounds.  Pulmonary/Chest: Effort normal and breath sounds normal.  Musculoskeletal: He exhibits no edema.  Neurological: He is alert and oriented to person, place, and time. Gait normal. GCS score is 15.  Skin: Skin is warm and dry.  Psychiatric: Mood, memory, affect and judgment normal.    Assessment and Plan :  1. Blood glucose elevated  - POCT glycosylated hemoglobin (Hb A1C)--6.3. 2.Obesity 3.HTN 4.HLD  I have done the exam and reviewed the chart and it is accurate to the best of my knowledge. Dentist has been used and  any errors in dictation or transcription are unintentional. Julieanne Manson M.D. [facility]

## 2018-07-25 ENCOUNTER — Other Ambulatory Visit: Payer: Self-pay

## 2018-07-25 NOTE — Patient Outreach (Signed)
[facility] Care Management  07/25/2018  Christian FleetingStephen L [name] [birth year] [id number]   Pharmacy call [employer] Medication Adherence call to [name] left a message for patient to call back patient is due on Olmesartan/Amlodipine/Hctz 40/10/25 and Atorvastatin 40 mg. Christian [name] is showing past due under [employer].   Christian AbedAna [name] CPhT Pharmacy Technician [facility] Care Management Direct Dial [phone]  Fax [phone] [name]

## 2018-07-28 ENCOUNTER — Ambulatory Visit: Payer: Self-pay | Admitting: Family Medicine

## 2018-08-10 ENCOUNTER — Other Ambulatory Visit: Payer: Self-pay

## 2018-08-10 NOTE — Patient Outreach (Signed)
[facility]) Care Management  08/10/2018  LEANORD THIBEAU 1951/02/15 [id number]   Medication Adherence call to Mr. Gamble Enderle left a message for patient to call back patient is due on Atorvastatin 40 mg and Olmesartan- Amlodepine 40-10-25 mg. Mr. Harada is showing past due under [employer].   Lillia Abed CPhT Pharmacy Technician [facility] Management Direct Dial [phone]  Fax [phone] [email or web address]

## 2018-08-14 IMAGING — CR DG CHEST 2V
1 series · 3 of 3 positions shown · non-contrast
Comparison: Chest x-ray of 10/15/2016

CLINICAL DATA: Cough, congestion, wheezing

EXAM:
CHEST - 2 VIEW

[Series 1: dg chest 2 view · 0.14mm/px · 3 of 3 slices shown]
[im 1/3]
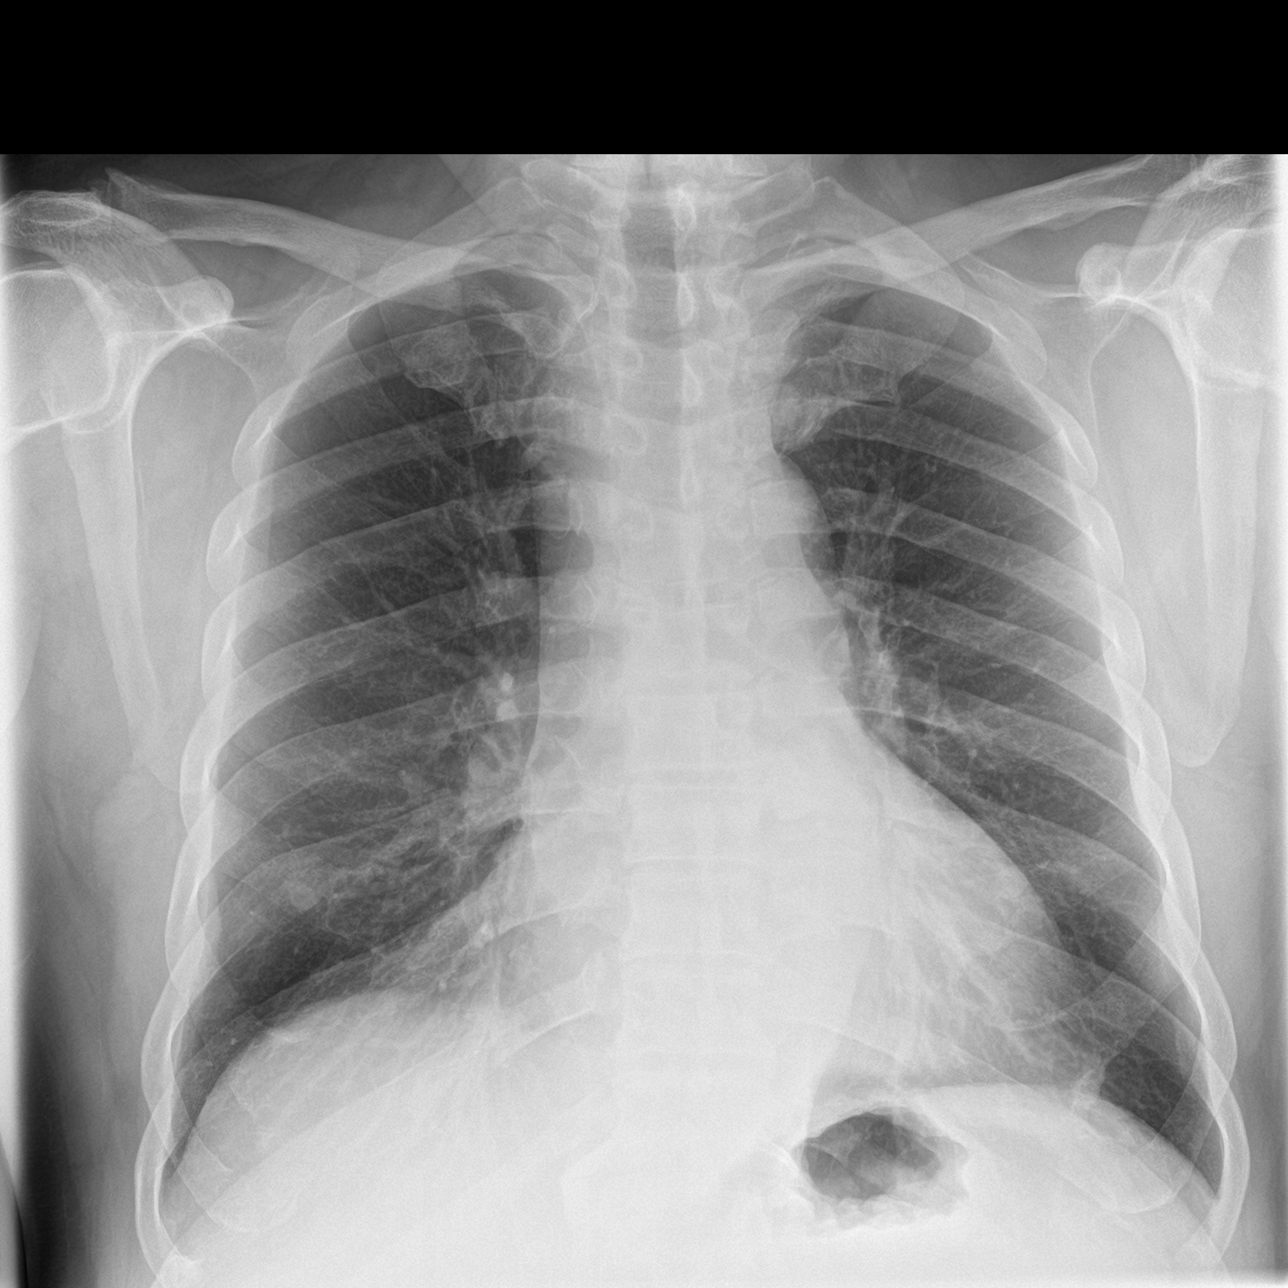
[im 2/3]
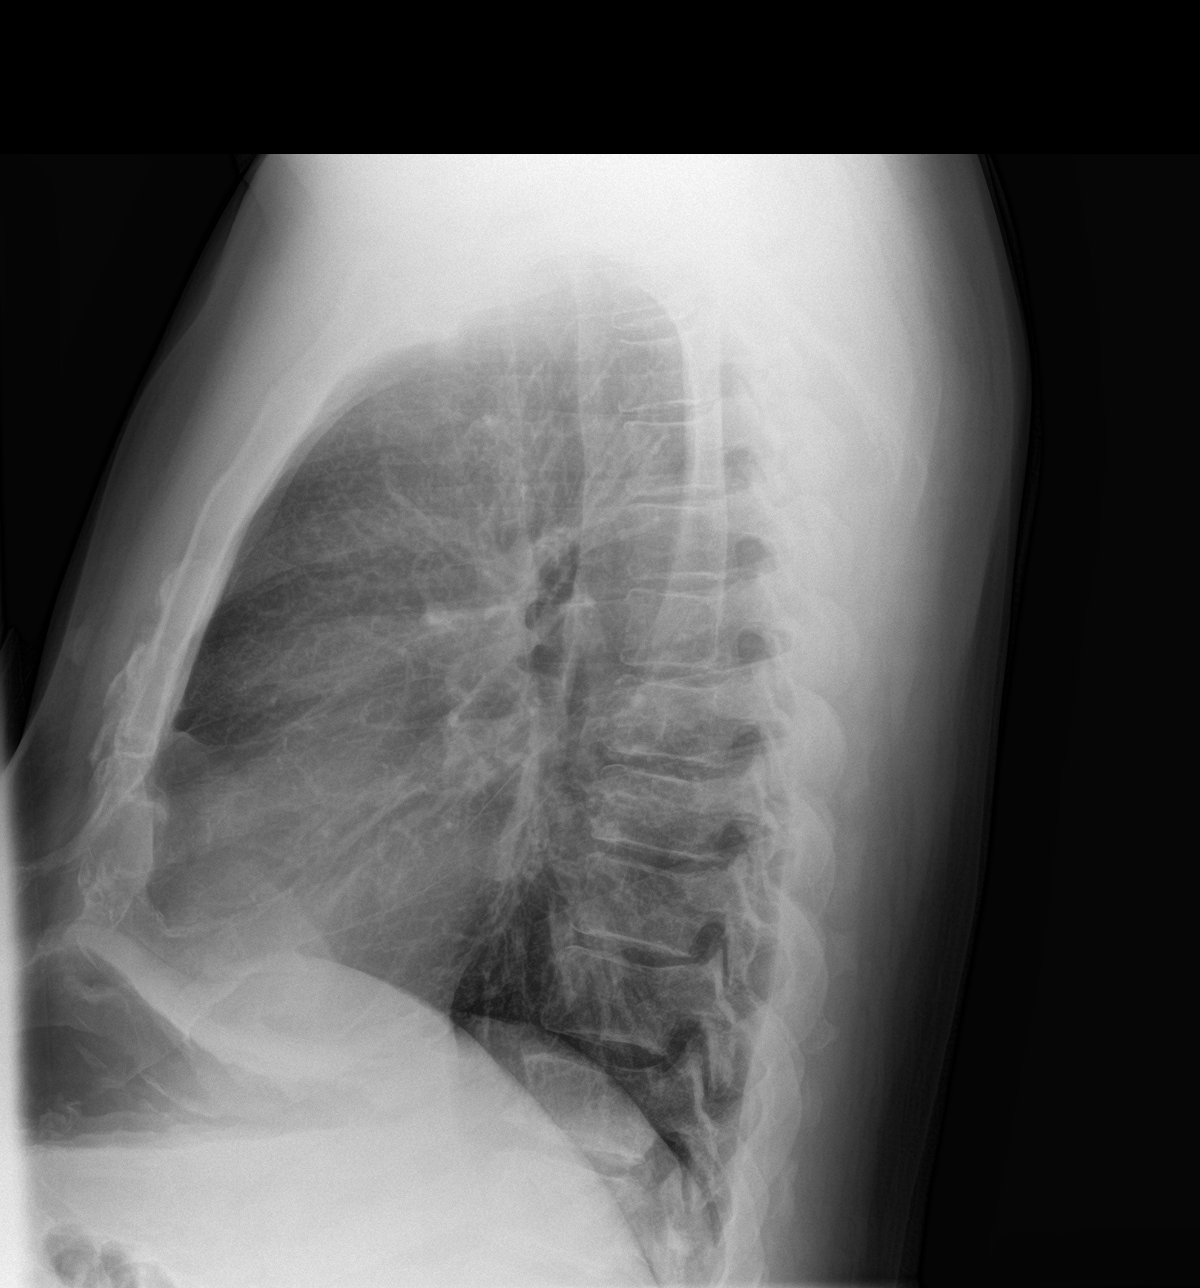
[im 3/3]
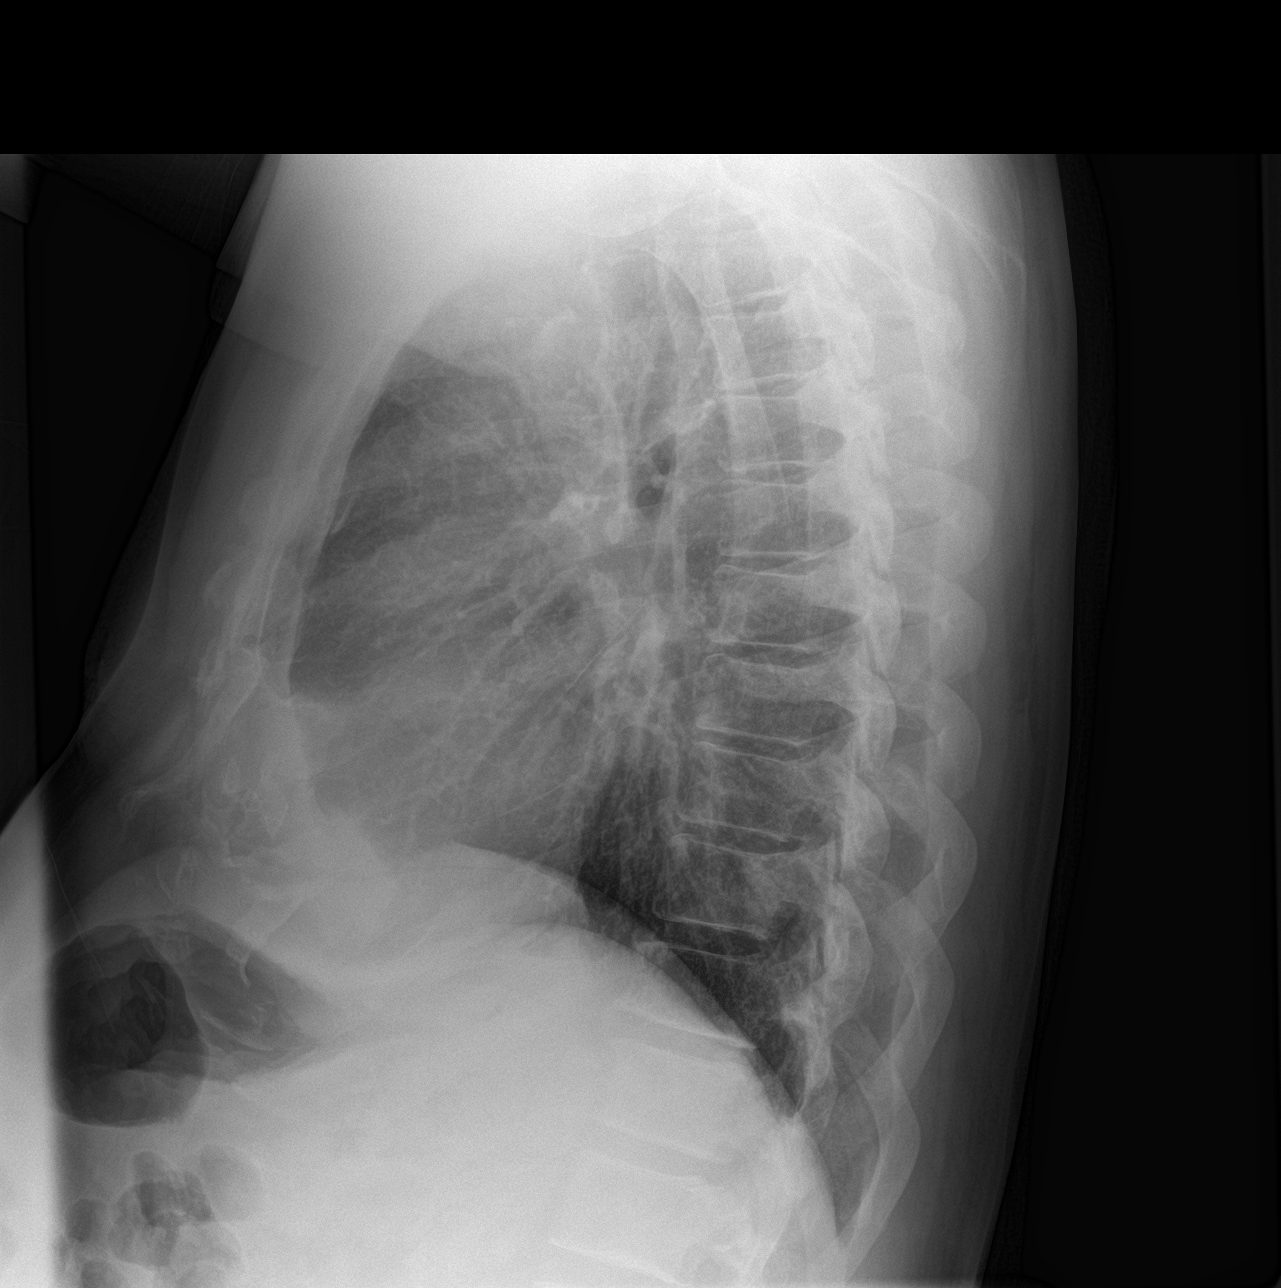

[3 of 3 positions shown; findings below may reference images not displayed]

FINDINGS: No active infiltrate or effusion is seen. Small nodular opacities
symmetrically at the lung bases are most consistent with nipple
shadows. Mediastinal and hilar contours are unremarkable. The heart
is within upper limits of normal. No acute bony abnormality is seen.
IMPRESSION: 1. No active lung disease. Probable nipple shadows at the lung
bases.
2. Stable borderline cardiomegaly.

## 2018-09-06 ENCOUNTER — Ambulatory Visit: Payer: Medicare Other | Admitting: Family Medicine

## 2018-09-06 ENCOUNTER — Encounter: Payer: Self-pay | Admitting: Family Medicine

## 2018-09-06 VITALS — BP 122/60 | HR 54 | Temp 97.9°F | Resp 16 | Ht 69.0 in | Wt 234.0 lb

## 2018-09-06 DIAGNOSIS — I10 Essential (primary) hypertension: Secondary | ICD-10-CM | POA: Diagnosis not present

## 2018-09-06 DIAGNOSIS — R739 Hyperglycemia, unspecified: Secondary | ICD-10-CM

## 2018-09-06 DIAGNOSIS — Z23 Encounter for immunization: Secondary | ICD-10-CM

## 2018-09-06 LAB — POCT GLYCOSYLATED HEMOGLOBIN (HGB A1C): HEMOGLOBIN A1C: 6.6 % — AB (ref 4.0–5.6)

## 2018-09-06 MED ORDER — METFORMIN HCL 500 MG PO TABS: 500.0000 mg | ORAL_TABLET | Freq: Every day | ORAL | 3 refills | Status: DC

## 2018-09-06 MED ORDER — MICROLET LANCETS MISC: 1.0000 | Freq: Every day | 3 refills | Status: AC

## 2018-09-06 MED ORDER — GLUCOSE BLOOD VI STRP: ORAL_STRIP | 12 refills | Status: DC

## 2018-09-06 NOTE — Progress Notes (Signed)
Patient: Christian [name] Male    DOB: 10/26/1951   67 y.o.   MRN: [id number] Visit Date: 09/06/2018  Today's Provider: Megan Mans, MD   Chief Complaint  Patient presents with  . Hyperglycemia  . Anxiety   Subjective:    HPI  Prediabetes, Follow-up:   Lab Results  Component Value Date   HGBA1C 6.6 (A) 09/06/2018   HGBA1C 6.3 (A) 06/06/2018   HGBA1C 6.2 07/26/2017   GLUCOSE 130 (H) 01/25/2018   GLUCOSE 124 (H) 07/26/2017   GLUCOSE 125 (H) 07/14/2016    Last seen for for this3 months ago.  Management since that visit includes no changes. Current symptoms include none and have been stable.  Weight trend: stable Prior visit with dietician: no Current diet: well balanced Current exercise: no regular exercise  Pertinent Labs:    Component Value Date/Time   CHOL 140 01/25/2018 1027   TRIG 118 01/25/2018 1027   CREATININE 1.04 01/25/2018 1027    Wt Readings from Last 3 Encounters:  09/06/18 234 lb (106.1 kg)  06/06/18 236 lb (107 kg)  01/31/18 236 lb 12.8 oz (107.4 kg)       No Known Allergies   Current Outpatient Medications:  .  ALPRAZolam (XANAX) 0.5 MG tablet, TAKE 1 TABLET BY MOUTH TWICE DAILY AS NEEDED, Disp: 60 tablet, Rfl: 5 .  aspirin 81 MG tablet, Take 81 mg by mouth daily. , Disp: , Rfl:  .  atorvastatin (LIPITOR) 40 MG tablet, TAKE 1 TABLET BY MOUTH AT  BEDTIME, Disp: 90 tablet, Rfl: 3 .  cholecalciferol (VITAMIN D) 1000 UNITS tablet, Take 1,000 Units by mouth daily. , Disp: , Rfl:  .  glucosamine-chondroitin 500-400 MG tablet, Take 1 tablet by mouth daily. , Disp: , Rfl:  .  Olmesartan-Amlodipine-HCTZ 40-10-25 MG TABS, TAKE 1 TABLET BY MOUTH DAILY, Disp: 90 tablet, Rfl: 3  Review of Systems  Constitutional: Negative for activity change, appetite change, chills, diaphoresis, fatigue and fever.  Eyes: Negative.   Respiratory: Negative for cough and shortness of breath.   Cardiovascular: Negative for chest pain, palpitations and  leg swelling.  Endocrine: Negative for cold intolerance, heat intolerance, polydipsia, polyphagia and polyuria.  Musculoskeletal: Negative.   Allergic/Immunologic: Negative for environmental allergies.  Neurological: Negative for dizziness, tremors, weakness, light-headedness, numbness and headaches.  Psychiatric/Behavioral: Negative.     Social History   Tobacco Use  . Smoking status: Never Smoker  . Smokeless tobacco: Current User    Types: Snuff  . Tobacco comment: uses smokeless tobacco "dip"  Substance Use Topics  . Alcohol use: Yes    Alcohol/week: 8.0 - 10.0 standard drinks    Types: 8 - 10 Cans of beer per week   Objective:   BP 122/60 (BP Location: Right Arm, Patient Position: Sitting, Cuff Size: Normal)   Pulse (!) 54   Temp 97.9 F (36.6 C)   Resp 16   Ht 5\' 9"  (1.753 m)   Wt 234 lb (106.1 kg)   SpO2 97%   BMI 34.56 kg/m  Vitals:   69/29/19 0850  BP: 122/60  Pulse: (!) 54  Resp: 16  Temp: 97.9 F (36.6 C)  SpO2: 97%  Weight: 234 lb (106.1 kg)  Height: 5\' 9"  (1.753 m)     Physical Exam  Constitutional: He is oriented to person, place, and time. He appears well-developed and well-nourished.  HENT:  Head: Normocephalic and atraumatic.  Eyes: Conjunctivae are normal. No scleral icterus.  Neck:  No thyromegaly present.  Cardiovascular: Normal rate, regular rhythm and normal heart sounds.  Pulmonary/Chest: Effort normal and breath sounds normal.  Abdominal: Soft.  Musculoskeletal: He exhibits no edema.  Neurological: He is alert and oriented to person, place, and time.  Skin: Skin is warm and dry.  Psychiatric: He has a normal mood and affect. His behavior is normal. Judgment and thought content normal.        Assessment & Plan:     1. Blood glucose elevated  - POCT glycosylated hemoglobin (Hb A1C) - Ambulatory referral to diabetic education  2. Essential hypertension   3. Need for influenza vaccination  - Flu vaccine HIGH DOSE PF (Fluzone  High dose)  I have done the exam and reviewed the chart and it is accurate to the best of my knowledge. Dentist has been used and  any errors in dictation or transcription are unintentional. Julieanne Manson M.D. [facility]       Megan Mans, MD  [facility]

## 2018-09-09 DIAGNOSIS — J209 Acute bronchitis, unspecified: Secondary | ICD-10-CM | POA: Diagnosis not present

## 2018-09-23 ENCOUNTER — Other Ambulatory Visit: Payer: Self-pay | Admitting: Family Medicine

## 2018-10-17 ENCOUNTER — Other Ambulatory Visit: Payer: Self-pay

## 2018-10-17 MED ORDER — GLUCOSE BLOOD VI STRP: 1.0000 | ORAL_STRIP | Freq: Every day | 12 refills | Status: AC

## 2018-10-27 ENCOUNTER — Ambulatory Visit: Payer: Medicare Other | Admitting: Family Medicine

## 2018-11-11 ENCOUNTER — Other Ambulatory Visit: Payer: Self-pay | Admitting: Family Medicine

## 2018-11-11 ENCOUNTER — Other Ambulatory Visit: Payer: Self-pay

## 2018-11-11 DIAGNOSIS — I10 Essential (primary) hypertension: Secondary | ICD-10-CM

## 2018-11-11 MED ORDER — ATORVASTATIN CALCIUM 40 MG PO TABS: 40.0000 mg | ORAL_TABLET | Freq: Every day | ORAL | 3 refills | Status: DC

## 2018-11-14 ENCOUNTER — Other Ambulatory Visit: Payer: Self-pay

## 2018-11-14 NOTE — Patient Outreach (Signed)
[facility]) Care Management  11/14/2018  KOHLE BIAGIOTTI Aug 02, 1951 [id number]   Medication Adherence call to Mr. Chaun Ellenberg  left a message for patient to call back patient is due on Atorvastatin 40 mg and  Olmesartan/Amlodepine/HCTZ 40/10/25 mg.Mr. Bartholomew Crews is showing past due under [employer].   Lillia Abed CPhT Pharmacy Technician [facility] Care Management Direct Dial [phone]  Fax [phone] [email or web address]

## 2018-11-17 ENCOUNTER — Ambulatory Visit (INDEPENDENT_AMBULATORY_CARE_PROVIDER_SITE_OTHER): Payer: Medicare Other | Admitting: Family Medicine

## 2018-11-17 ENCOUNTER — Encounter: Payer: Self-pay | Admitting: Family Medicine

## 2018-11-17 VITALS — BP 124/82 | HR 49 | Temp 98.0°F | Resp 16 | Wt 233.0 lb

## 2018-11-17 DIAGNOSIS — E6609 Other obesity due to excess calories: Secondary | ICD-10-CM

## 2018-11-17 DIAGNOSIS — R0602 Shortness of breath: Secondary | ICD-10-CM

## 2018-11-17 DIAGNOSIS — E119 Type 2 diabetes mellitus without complications: Secondary | ICD-10-CM

## 2018-11-17 DIAGNOSIS — I10 Essential (primary) hypertension: Secondary | ICD-10-CM | POA: Diagnosis not present

## 2018-11-17 DIAGNOSIS — E782 Mixed hyperlipidemia: Secondary | ICD-10-CM

## 2018-11-17 DIAGNOSIS — Z6834 Body mass index (BMI) 34.0-34.9, adult: Secondary | ICD-10-CM

## 2018-11-17 NOTE — Progress Notes (Signed)
Patient: Christian [name] Male    DOB: [birth year]   68 y.o.   MRN: [id number] Visit Date: 11/17/2018  Today's Provider: Megan Mans, MD   Chief Complaint  Patient presents with  . Diabetes    new onset  . Cough   Subjective:     HPI   Diabetes Mellitus Type II, Follow-up:   Lab Results  Component Value Date   HGBA1C 6.6 (A) 09/06/2018   HGBA1C 6.3 (A) 06/06/2018   HGBA1C 6.2 07/26/2017    Last seen for diabetes 3 months ago.  Management since then includes starting on Metformin. He reports good compliance with treatment. He is not having side effects.  Current symptoms include none and have been stable. Home blood sugar records: fasting range: 130s  Episodes of hypoglycemia? no   Current Insulin Regimen: none  Most Recent Eye Exam: Weight trend: stable Prior visit with dietician: no Current diet: well balanced Current exercise: no regular exercise  Pertinent Labs:    Component Value Date/Time   CHOL 140 01/25/2018 1027   TRIG 118 01/25/2018 1027   HDL 39 (L) 01/25/2018 1027   LDLCALC 77 01/25/2018 1027   CREATININE 1.04 01/25/2018 1027    Wt Readings from Last 3 Encounters:  11/17/18 233 lb (105.7 kg)  09/06/18 234 lb (106.1 kg)  06/06/18 236 lb (107 kg)   Patient also reports that he had a cough for the last 3 weeks.   No Known Allergies   Current Outpatient Medications:  .  ALPRAZolam (XANAX) 0.5 MG tablet, TAKE 1 TABLET BY MOUTH TWICE DAILY AS NEEDED, Disp: 60 tablet, Rfl: 5 .  aspirin 81 MG tablet, Take 81 mg by mouth daily. , Disp: , Rfl:  .  atorvastatin (LIPITOR) 40 MG tablet, Take 1 tablet (40 mg total) by mouth at bedtime., Disp: 90 tablet, Rfl: 3 .  cholecalciferol (VITAMIN D) 1000 UNITS tablet, Take 1,000 Units by mouth daily. , Disp: , Rfl:  .  glucosamine-chondroitin 500-400 MG tablet, Take 1 tablet by mouth daily. , Disp: , Rfl:  .  glucose blood (CONTOUR NEXT TEST) test strip, 1 each by Other route daily. Use as  instructed, Disp: 100 each, Rfl: 12 .  metFORMIN (GLUCOPHAGE) 500 MG tablet, Take 1 tablet (500 mg total) by mouth daily with breakfast., Disp: 90 tablet, Rfl: 3 .  MICROLET LANCETS MISC, 1 each by Does not apply route daily., Disp: 100 each, Rfl: 3 .  Olmesartan-amLODIPine-HCTZ 40-10-25 MG TABS, TAKE 1 TABLET BY MOUTH DAILY, Disp: 90 tablet, Rfl: 3  Review of Systems  Constitutional: Negative for activity change, diaphoresis and fatigue.  HENT: Negative.   Eyes: Negative.   Respiratory: Negative for cough and shortness of breath.   Cardiovascular: Negative for chest pain, palpitations and leg swelling.  Gastrointestinal: Negative.   Endocrine: Negative for cold intolerance, heat intolerance, polydipsia, polyphagia and polyuria.  Musculoskeletal: Negative for arthralgias.  Allergic/Immunologic: Negative.   Neurological: Negative for dizziness, light-headedness and headaches.  Psychiatric/Behavioral: Negative.     Social History   Tobacco Use  . Smoking status: Never Smoker  . Smokeless tobacco: Current User    Types: Snuff  . Tobacco comment: uses smokeless tobacco "dip"  Substance Use Topics  . Alcohol use: Yes    Alcohol/week: 8.0 - 10.0 standard drinks    Types: 8 - 10 Cans of beer per week      Objective:   BP 124/82 (BP Location: Left Arm, Patient Position:  Sitting, Cuff Size: Large)   Pulse (!) 49   Temp 98 F (36.7 C)   Resp 16   Wt 233 lb (105.7 kg)   SpO2 99%   BMI 34.41 kg/m  Vitals:   11/17/18 1348  BP: 124/82  Pulse: (!) 49  Resp: 16  Temp: 98 F (36.7 C)  SpO2: 99%  Weight: 233 lb (105.7 kg)     Physical Exam Constitutional:      Appearance: Normal appearance. He is well-developed.  HENT:     Head: Normocephalic and atraumatic.     Right Ear: External ear normal.     Left Ear: External ear normal.     Nose: Nose normal.  Eyes:     Conjunctiva/sclera: Conjunctivae normal.     Pupils: Pupils are equal, round, and reactive to light.  Neck:       Musculoskeletal: Normal range of motion and neck supple.  Cardiovascular:     Rate and Rhythm: Normal rate and regular rhythm.     Heart sounds: Normal heart sounds.  Pulmonary:     Effort: Pulmonary effort is normal.     Breath sounds: Normal breath sounds.  Abdominal:     Palpations: Abdomen is soft.  Musculoskeletal: Normal range of motion.  Skin:    General: Skin is warm and dry.  Neurological:     General: No focal deficit present.     Mental Status: He is alert and oriented to person, place, and time. Mental status is at baseline.     Deep Tendon Reflexes: Reflexes are normal and symmetric.  Psychiatric:        Mood and Affect: Mood normal.        Behavior: Behavior normal.        Thought Content: Thought content normal.        Judgment: Judgment normal.         Assessment & Plan    1. Shortness of breath Mild COPD/Asthma   FEV1 55%. Try inhaler. Consider Pulmonary referral. - Spirometry with graph; Future - Spirometry with graph  2. Essential hypertension  - CBC with Differential/Platelet - Comprehensive metabolic panel  3. Mixed hyperlipidemia  - Lipid panel - TSH  4. Diabetes mellitus without complication (HCC)  - Hemoglobin A1c  5. Class 1 obesity due to excess calories with serious comorbidity and body mass index (BMI) of 34.0 to 34.9 in adult With DM,HTN.    I have done the exam and reviewed the chart and it is accurate to the best of my knowledge. Dentist has been used and  any errors in dictation or transcription are unintentional. Julieanne Manson M.D. [facility]  Megan Mans, MD  [facility]

## 2018-11-26 DIAGNOSIS — E669 Obesity, unspecified: Secondary | ICD-10-CM | POA: Insufficient documentation

## 2018-11-28 ENCOUNTER — Other Ambulatory Visit: Payer: Self-pay | Admitting: Family Medicine

## 2018-11-28 NOTE — Telephone Encounter (Signed)
[facility] faxed refill request for the following medications:  ALPRAZolam (XANAX) 0.5 MG tablet   Please advise.  

## 2018-11-30 MED ORDER — ALPRAZOLAM 0.5 MG PO TABS: 0.5000 mg | ORAL_TABLET | Freq: Two times a day (BID) | ORAL | 5 refills | Status: DC | PRN

## 2018-12-06 ENCOUNTER — Telehealth: Payer: Self-pay | Admitting: Family Medicine

## 2018-12-06 NOTE — Telephone Encounter (Signed)
No samples of Breo.

## 2018-12-06 NOTE — Telephone Encounter (Signed)
Patient's wife is calling to see if they can get another sample of Breo 12300mcg/25mcg.  She states that he will not have enough to last until his next visit.  The medication is working and the patient is not coughing.  She states that if we do not have a sample could you send it to the pharmacy.  [address] [facility] [address]

## 2018-12-07 ENCOUNTER — Ambulatory Visit: Payer: Self-pay | Admitting: Family Medicine

## 2018-12-08 ENCOUNTER — Other Ambulatory Visit: Payer: Self-pay

## 2018-12-08 MED ORDER — FLUTICASONE FUROATE-VILANTEROL 100-25 MCG/INH IN AEPB: 1.0000 | INHALATION_SPRAY | Freq: Every day | RESPIRATORY_TRACT | 12 refills | Status: DC

## 2018-12-16 DIAGNOSIS — E119 Type 2 diabetes mellitus without complications: Secondary | ICD-10-CM | POA: Diagnosis not present

## 2018-12-16 DIAGNOSIS — E782 Mixed hyperlipidemia: Secondary | ICD-10-CM | POA: Diagnosis not present

## 2018-12-16 DIAGNOSIS — I10 Essential (primary) hypertension: Secondary | ICD-10-CM | POA: Diagnosis not present

## 2018-12-17 LAB — COMPREHENSIVE METABOLIC PANEL
ALT: 43 IU/L (ref 0–44)
AST: 23 IU/L (ref 0–40)
Albumin/Globulin Ratio: 1.8 (ref 1.2–2.2)
Albumin: 4.5 g/dL (ref 3.8–4.8)
Alkaline Phosphatase: 85 IU/L (ref 39–117)
BUN/Creatinine Ratio: 18 (ref 10–24)
BUN: 22 mg/dL (ref 8–27)
Bilirubin Total: 0.4 mg/dL (ref 0.0–1.2)
CALCIUM: 9.9 mg/dL (ref 8.6–10.2)
CHLORIDE: 101 mmol/L (ref 96–106)
CO2: 23 mmol/L (ref 20–29)
Creatinine, Ser: 1.19 mg/dL (ref 0.76–1.27)
GFR calc Af Amer: 72 mL/min/{1.73_m2} (ref >59)
GFR, EST NON AFRICAN AMERICAN: 62 mL/min/{1.73_m2} (ref >59)
GLUCOSE: 127 mg/dL — AB (ref 65–99)
Globulin, Total: 2.5 g/dL (ref 1.5–4.5)
POTASSIUM: 5 mmol/L (ref 3.5–5.2)
Sodium: 141 mmol/L (ref 134–144)
TOTAL PROTEIN: 7 g/dL (ref 6.0–8.5)

## 2018-12-17 LAB — CBC WITH DIFFERENTIAL/PLATELET
BASOS ABS: 0.1 10*3/uL (ref 0.0–0.2)
Basos: 1 %
EOS (ABSOLUTE): 0.2 10*3/uL (ref 0.0–0.4)
Eos: 3 %
Hematocrit: 43.8 % (ref 37.5–51.0)
Hemoglobin: 15 g/dL (ref 13.0–17.7)
IMMATURE GRANULOCYTES: 0 %
Immature Grans (Abs): 0 10*3/uL (ref 0.0–0.1)
Lymphocytes Absolute: 1.7 10*3/uL (ref 0.7–3.1)
Lymphs: 26 %
MCH: 32.1 pg (ref 26.6–33.0)
MCHC: 34.2 g/dL (ref 31.5–35.7)
MCV: 94 fL (ref 79–97)
Monocytes Absolute: 0.5 10*3/uL (ref 0.1–0.9)
Monocytes: 8 %
NEUTROS ABS: 4.2 10*3/uL (ref 1.4–7.0)
Neutrophils: 62 %
PLATELETS: 209 10*3/uL (ref 150–450)
RBC: 4.68 x10E6/uL (ref 4.14–5.80)
RDW: 13.2 % (ref 11.6–15.4)
WBC: 6.7 10*3/uL (ref 3.4–10.8)

## 2018-12-17 LAB — HEMOGLOBIN A1C
ESTIMATED AVERAGE GLUCOSE: 126 mg/dL
Hgb A1c MFr Bld: 6 % — ABNORMAL HIGH (ref 4.8–5.6)

## 2018-12-17 LAB — TSH: TSH: 1.55 u[IU]/mL (ref 0.450–4.500)

## 2018-12-17 LAB — LIPID PANEL
CHOLESTEROL TOTAL: 136 mg/dL (ref 100–199)
Chol/HDL Ratio: 3.2 ratio (ref 0.0–5.0)
HDL: 42 mg/dL (ref >39)
LDL Calculated: 68 mg/dL (ref 0–99)
TRIGLYCERIDES: 129 mg/dL (ref 0–149)
VLDL Cholesterol Cal: 26 mg/dL (ref 5–40)

## 2018-12-21 ENCOUNTER — Encounter: Payer: Self-pay | Admitting: Family Medicine

## 2018-12-21 ENCOUNTER — Ambulatory Visit (INDEPENDENT_AMBULATORY_CARE_PROVIDER_SITE_OTHER): Payer: Medicare Other | Admitting: Family Medicine

## 2018-12-21 VITALS — BP 118/70 | HR 50 | Temp 98.5°F | Resp 16 | Ht 69.0 in | Wt 233.0 lb

## 2018-12-21 DIAGNOSIS — E6609 Other obesity due to excess calories: Secondary | ICD-10-CM | POA: Diagnosis not present

## 2018-12-21 DIAGNOSIS — I10 Essential (primary) hypertension: Secondary | ICD-10-CM

## 2018-12-21 DIAGNOSIS — F320 Major depressive disorder, single episode, mild: Secondary | ICD-10-CM | POA: Diagnosis not present

## 2018-12-21 DIAGNOSIS — J4520 Mild intermittent asthma, uncomplicated: Secondary | ICD-10-CM | POA: Diagnosis not present

## 2018-12-21 DIAGNOSIS — Z6834 Body mass index (BMI) 34.0-34.9, adult: Secondary | ICD-10-CM

## 2018-12-21 NOTE — Progress Notes (Signed)
Patient: Christian [name] Male    DOB: Apr 03, 1951   68 y.o.   MRN: [id number] Visit Date: 12/21/2018  Today's Provider: Megan Mans, MD   Chief Complaint  Patient presents with  . Follow-up   Subjective:     HPI  Patient comes in today for a follow up. He was last seen in the office 1 month ago. He was started on an inhaler Virgel Bouquet), and reports that it has helped his symptoms completely. He reports that 2 days after using the inhaler, his cough was gone.   No Known Allergies   Current Outpatient Medications:  .  ALPRAZolam (XANAX) 0.5 MG tablet, Take 1 tablet (0.5 mg total) by mouth 2 (two) times daily as needed., Disp: 60 tablet, Rfl: 5 .  aspirin 81 MG tablet, Take 81 mg by mouth daily. , Disp: , Rfl:  .  atorvastatin (LIPITOR) 40 MG tablet, Take 1 tablet (40 mg total) by mouth at bedtime., Disp: 90 tablet, Rfl: 3 .  cholecalciferol (VITAMIN D) 1000 UNITS tablet, Take 1,000 Units by mouth daily. , Disp: , Rfl:  .  fluticasone furoate-vilanterol (BREO ELLIPTA) 100-25 MCG/INH AEPB, Inhale 1 puff into the lungs daily., Disp: 28 each, Rfl: 12 .  glucosamine-chondroitin 500-400 MG tablet, Take 1 tablet by mouth daily. , Disp: , Rfl:  .  glucose blood (CONTOUR NEXT TEST) test strip, 1 each by Other route daily. Use as instructed, Disp: 100 each, Rfl: 12 .  metFORMIN (GLUCOPHAGE) 500 MG tablet, Take 1 tablet (500 mg total) by mouth daily with breakfast., Disp: 90 tablet, Rfl: 3 .  MICROLET LANCETS MISC, 1 each by Does not apply route daily., Disp: 100 each, Rfl: 3 .  Olmesartan-amLODIPine-HCTZ 40-10-25 MG TABS, TAKE 1 TABLET BY MOUTH DAILY, Disp: 90 tablet, Rfl: 3  Review of Systems  Constitutional: Negative for activity change, appetite change, chills, diaphoresis, fatigue, fever and unexpected weight change.  Eyes: Negative.   Respiratory: Negative for cough and shortness of breath.   Cardiovascular: Negative for chest pain, palpitations and leg swelling.   Gastrointestinal: Negative.   Endocrine: Negative.   Musculoskeletal: Negative.   Skin: Negative.   Allergic/Immunologic: Negative for environmental allergies.  Neurological: Negative for dizziness and headaches.  Psychiatric/Behavioral: Negative.     Social History   Tobacco Use  . Smoking status: Never Smoker  . Smokeless tobacco: Current User    Types: Snuff  . Tobacco comment: uses smokeless tobacco "dip"  Substance Use Topics  . Alcohol use: Yes    Alcohol/week: 8.0 - 10.0 standard drinks    Types: 8 - 10 Cans of beer per week      Objective:   BP 118/70 (BP Location: Left Arm, Patient Position: Sitting, Cuff Size: Large)   Pulse (!) 50   Temp 98.5 F (36.9 C)   Resp 16   Ht 5\' 9"  (1.753 m)   Wt 233 lb (105.7 kg)   SpO2 99%   BMI 34.41 kg/m  Vitals:   12/21/18 0817  BP: 118/70  Pulse: (!) 50  Resp: 16  Temp: 98.5 F (36.9 C)  SpO2: 99%  Weight: 233 lb (105.7 kg)  Height: 5\' 9"  (1.753 m)     Physical Exam Vitals signs reviewed.  Constitutional:      Appearance: He is well-developed.  HENT:     Head: Normocephalic and atraumatic.     Right Ear: External ear normal.     Left Ear: External ear normal.  Nose: Nose normal.  Eyes:     Conjunctiva/sclera: Conjunctivae normal.     Pupils: Pupils are equal, round, and reactive to light.  Neck:     Musculoskeletal: Normal range of motion and neck supple.  Cardiovascular:     Rate and Rhythm: Normal rate and regular rhythm.     Heart sounds: Normal heart sounds.  Pulmonary:     Effort: Pulmonary effort is normal.     Breath sounds: Normal breath sounds.  Abdominal:     General: Bowel sounds are normal.     Palpations: Abdomen is soft.  Genitourinary:    Penis: Normal.      Prostate: Normal.     Rectum: Normal.  Musculoskeletal: Normal range of motion.  Skin:    General: Skin is warm and dry.  Neurological:     General: No focal deficit present.     Mental Status: He is alert and oriented to  person, place, and time. Mental status is at baseline.  Psychiatric:        Mood and Affect: Mood normal.        Behavior: Behavior normal.        Thought Content: Thought content normal.        Judgment: Judgment normal.         Assessment & Plan    1. Essential hypertension   2. Depression, major, single episode, mild (HCC) Partial remission.  3. Class 1 obesity due to excess calories with serious comorbidity and body mass index (BMI) of 34.0 to 34.9 in adult With hypertension and hyperlipidemia  4. Mild intermittent reactive airway disease without complication Proved on Breo.  Continue 1 puff daily.  Return to clinic 3 to 4 months.  I have done the exam and reviewed the chart and it is accurate to the best of my knowledge. Dentist has been used and  any errors in dictation or transcription are unintentional. Julieanne Manson M.D. [facility]      Megan Mans, MD  [facility]

## 2018-12-24 DIAGNOSIS — J45909 Unspecified asthma, uncomplicated: Secondary | ICD-10-CM | POA: Insufficient documentation

## 2019-01-30 ENCOUNTER — Ambulatory Visit: Payer: Self-pay

## 2019-02-28 ENCOUNTER — Ambulatory Visit: Payer: Medicare Other

## 2019-03-30 ENCOUNTER — Other Ambulatory Visit: Payer: Self-pay

## 2019-03-30 NOTE — Patient Outreach (Signed)
[facility]) Care Management  03/30/2019  Christian [name] [birth year] [id number]   Medication Adherence call to [name] spoke with patient he wants a call back at a later time patient is showing past due on Metformin 500 mg.under [employer].   Lillia Abed CPhT Pharmacy Technician [facility] Care Management Direct Dial [phone]  Fax [phone] [name]

## 2019-05-18 ENCOUNTER — Other Ambulatory Visit: Payer: Self-pay

## 2019-05-18 ENCOUNTER — Ambulatory Visit (INDEPENDENT_AMBULATORY_CARE_PROVIDER_SITE_OTHER): Payer: Medicare Other

## 2019-05-18 DIAGNOSIS — Z0000 Encounter for general adult medical examination without abnormal findings: Secondary | ICD-10-CM

## 2019-05-18 NOTE — Progress Notes (Signed)
Subjective:   Christian [name] is a 68 y.o. male who presents for Medicare Annual/Subsequent preventive examination.    This visit is being conducted through telemedicine due to the COVID-19 pandemic. This patient has given me verbal consent via doximity to conduct this visit, patient states they are participating from their home address. Some vital signs may be absent or patient reported.    Patient identification: identified by name, DOB, and current address  Review of Systems:  N/A  Cardiac Risk Factors include: advanced age (>91men, >75 women);dyslipidemia;hypertension;male gender;obesity (BMI >30kg/m2)     Objective:    Vitals: There were no vitals taken for this visit.  There is no height or weight on file to calculate BMI. Unable to obtain vitals due to visit being conducted via telephonically.   Advanced Directives 05/18/2019 01/25/2018 01/19/2017 01/07/2016  Does Patient Have a Medical Advance Directive? No No No No  Would patient like information on creating a medical advance directive? No - Patient declined - No - Patient declined -    Tobacco Social History   Tobacco Use  Smoking Status Never Smoker  Smokeless Tobacco Current User  . Types: Snuff  Tobacco Comment   uses smokeless tobacco "dip"     Ready to quit: Not Answered Counseling given: Not Answered Comment: uses smokeless tobacco "dip"   Clinical Intake:  Pre-visit preparation completed: Yes  Pain Score: 0-No pain     Nutritional Risks: None Diabetes: No  How often do you need to have someone help you when you read instructions, pamphlets, or other written materials from your doctor or pharmacy?: 1 - Never  Interpreter Needed?: No  Information entered by :: [facility], LPN  Past Medical History:  Diagnosis Date  . Anxiety   . Bradycardia   . Hyperlipidemia   . Hypertension    Past Surgical History:  Procedure Laterality Date  . INGUINAL HERNIA REPAIR Left   . vasectomy  38    Family History  Problem Relation Age of Onset  . Dementia Mother   . Colon cancer Father   . Hypertension Sister   . Hypertension Son    Social History   Socioeconomic History  . Marital status: Married    Spouse name: Not on file  . Number of children: 3  . Years of education: Not on file  . Highest education level: 12th grade  Occupational History  . 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 Financial resource strain: Not hard at all  . Food insecurity    Worry: Never true    Inability: Never true  . Transportation needs    Medical: No    Non-medical: No  Tobacco Use  . Smoking status: Never Smoker  . Smokeless tobacco: Current User    Types: Snuff  . Tobacco comment: uses smokeless tobacco "dip"  Substance and Sexual Activity  . Alcohol use: Yes    Alcohol/week: 8.0 - 10.0 standard drinks    Types: 8 - 10 Cans of beer per week  . Drug use: No  . Sexual activity: Not on file  Lifestyle  . Physical activity    Days per week: 0 days    Minutes per session: 0 min  . Stress: Not at all  Relationships  . Social connections    Talks on phone: Patient refused    Gets together: Patient refused    Attends religious service: Patient refused    Active member  of club or organization: Patient refused    Attends meetings of clubs or organizations: Patient refused    Relationship status: Patient refused  Other Topics Concern  . Not on file  Social History Narrative  . Not on file    Outpatient Encounter Medications as of 05/18/2019  Medication Sig  . ALPRAZolam (XANAX) 0.5 MG tablet Take 1 tablet (0.5 mg total) by mouth 2 (two) times daily as needed.  Marland Kitchen. aspirin 81 MG tablet Take 81 mg by mouth daily.   Marland Kitchen. atorvastatin (LIPITOR) 40 MG tablet Take 1 tablet (40 mg total) by mouth at bedtime.  . cholecalciferol (VITAMIN D) 1000 UNITS tablet Take 1,000 Units by mouth daily.   . fluticasone furoate-vilanterol (BREO ELLIPTA) 100-25  MCG/INH AEPB Inhale 1 puff into the lungs daily.  Marland Kitchen. glucosamine-chondroitin 500-400 MG tablet Take 1 tablet by mouth daily.   Marland Kitchen. glucose blood (CONTOUR NEXT TEST) test strip 1 each by Other route daily. Use as instructed  . metFORMIN (GLUCOPHAGE) 500 MG tablet Take 1 tablet (500 mg total) by mouth daily with breakfast.  . MICROLET LANCETS MISC 1 each by Does not apply route daily.  . Olmesartan-amLODIPine-HCTZ 40-10-25 MG TABS TAKE 1 TABLET BY MOUTH DAILY   No facility-administered encounter medications on file as of 05/18/2019.     Activities of Daily Living In your present state of health, do you have any difficulty performing the following activities: 05/18/2019  Hearing? Y  Comment Does not wear hearing aids.  Vision? N  Difficulty concentrating or making decisions? N  Walking or climbing stairs? N  Dressing or bathing? N  Doing errands, shopping? N  Preparing Food and eating ? N  Using the Toilet? N  In the past six months, have you accidently leaked urine? N  Do you have problems with loss of bowel control? N  Managing your Medications? N  Managing your Finances? N  Housekeeping or managing your Housekeeping? N  Some recent data might be hidden    Patient Care Team: Maple HudsonGilbert, Richard L Jr., MD as PCP - General (Family Medicine) Dingeldein, Viviann SpareSteven, MD as Consulting Physician (Ophthalmology) Lemar LivingsByrnett, Merrily PewJeffrey W, MD as Consulting Physician (General Surgery)   Assessment:   This is a routine wellness examination for Christian [name].  Exercise Activities and Dietary recommendations Current Exercise Habits: The patient does not participate in regular exercise at present, Exercise limited by: None identified  Goals    . DIET - REDUCE PORTION SIZE     Recommend decreasing portion sizes by half and eating 3 small meals a day with 2 healthy snacks in between.     . Increase water intake     Recommend increasing water intake to 4-5 glasses a day.       Fall Risk: Fall Risk  05/18/2019  01/25/2018 01/19/2017 01/07/2016 09/09/2015  Falls in the past year? 0 No No No No    FALL RISK PREVENTION PERTAINING TO THE HOME:  Any stairs in or around the home? Yes  If so, are there any without handrails? No   Home free of loose throw rugs in walkways, pet beds, electrical cords, etc? Yes  Adequate lighting in your home to reduce risk of falls? Yes   ASSISTIVE DEVICES UTILIZED TO PREVENT FALLS:  Life alert? No  Use of a cane, walker or w/c? No  Grab bars in the bathroom? Yes Shower chair or bench in shower? Yes  Elevated toilet seat or a handicapped toilet? Yes   TIMED UP AND GO:  Was the test performed? No .    Depression Screen PHQ 2/9 Scores 05/18/2019 01/25/2018 01/25/2018 01/19/2017  PHQ - 2 Score 0 0 0 0  PHQ- 9 Score - 0 - 0    Cognitive Function: Declined today.      6CIT Screen 01/19/2017  What Year? 0 points  What month? 0 points  What time? 0 points  Count back from 20 0 points  Months in reverse 2 points  Repeat phrase 2 points  Total Score 4    Immunization History  Administered Date(s) Administered  . Influenza Split 08/23/2012  . Influenza, High Dose Seasonal PF 09/09/2015, 07/14/2016, 07/26/2017, 09/06/2018  . Influenza,inj,Quad PF,6+ Mos 09/07/2013  . Pneumococcal Conjugate-13 01/01/2015  . Pneumococcal Polysaccharide-23 01/07/2016  . Tdap 07/29/2011  . Zoster 09/07/2013    Qualifies for Shingles Vaccine? Yes  Zostavax completed 09/07/13. Due for Shingrix. Education has been provided regarding the importance of this vaccine. Pt has been advised to call insurance company to determine out of pocket expense. Advised may also receive vaccine at local pharmacy or Health Dept. Verbalized acceptance and understanding.  Tdap: Up to date  Flu Vaccine: Up to date  Pneumococcal Vaccine: Completed series  Screening Tests Health Maintenance  Topic Date Due  . INFLUENZA VACCINE  06/10/2019  . COLONOSCOPY  10/02/2019  . TETANUS/TDAP  07/28/2021  .  Hepatitis C Screening  Completed  . PNA vac Low Risk Adult  Completed   Cancer Screenings:  Colorectal Screening: Completed 10/01/09. Repeat every 10 years.  Lung Cancer Screening: (Low Dose CT Chest recommended if Age 76-80 years, 30 pack-year currently smoking OR have quit w/in 15years.) does not qualify.   Additional Screening:  Hepatitis C Screening: Up to date  Vision Screening: Recommended annual ophthalmology exams for early detection of glaucoma and other disorders of the eye.  Dental Screening: Recommended annual dental exams for proper oral hygiene  Community Resource Referral:  CRR required this visit?  No        Plan:  I have personally reviewed and addressed the Medicare Annual Wellness questionnaire and have noted the following in the patient's chart:  A. Medical and social history B. Use of alcohol, tobacco or illicit drugs  C. Current medications and supplements D. Functional ability and status E.  Nutritional status F.  Physical activity G. Advance directives H. List of other physicians I.  Hospitalizations, surgeries, and ER visits in previous 12 months J.  Vitals K. Screenings such as hearing and vision if needed, cognitive and depression L. Referrals and appointments   In addition, I have reviewed and discussed with patient certain preventive protocols, quality metrics, and best practice recommendations. A written personalized care plan for preventive services as well as general preventive health recommendations were provided to patient.   Darrick HuntsmanSigned,   Marshall Roehrich, LPN  4/5/40987/07/2019 Nurse Health Advisor   Nurse Notes: None.

## 2019-05-18 NOTE — Patient Instructions (Signed)
[name] , Thank you for taking time to come for your Medicare Wellness Visit. I appreciate your ongoing commitment to your health goals. Please review the following plan we discussed and let me know if I can assist you in the future.   Screening recommendations/referrals: Colonoscopy: Up to date, due 09/2019 Recommended yearly ophthalmology/optometry visit for glaucoma screening and checkup Recommended yearly dental visit for hygiene and checkup  Vaccinations: Influenza vaccine: Up to date Pneumococcal vaccine: Completed series Tdap vaccine: Up to date, due 07/2021 Shingles vaccine: Pt declines today.     Advanced directives: Advance directive discussed with you today. Even though you declined this today please call our office should you change your mind and we can give you the proper paperwork for you to fill out.  Conditions/risks identified: Continue to work on a balanced diet to help aid in weight loss.   Next appointment: 06/21/19 with Dr Sullivan LoneGilbert.   Preventive Care 7070 Years and Older, Male Preventive care refers to lifestyle choices and visits with your health care provider that can promote health and wellness. What does preventive care include?  A yearly physical exam. This is also called an annual well check.  Dental exams once or twice a year.  Routine eye exams. Ask your health care provider how often you should have your eyes checked.  Personal lifestyle choices, including:  Daily care of your teeth and gums.  Regular physical activity.  Eating a healthy diet.  Avoiding tobacco and drug use.  Limiting alcohol use.  Practicing safe sex.  Taking low doses of aspirin every day.  Taking vitamin and mineral supplements as recommended by your health care provider. What happens during an annual well check? The services and screenings done by your health care provider during your annual well check will depend on your age, overall health, lifestyle risk factors,  and family history of disease. Counseling  Your health care provider may ask you questions about your:  Alcohol use.  Tobacco use.  Drug use.  Emotional well-being.  Home and relationship well-being.  Sexual activity.  Eating habits.  History of falls.  Memory and ability to understand (cognition).  Work and work Astronomerenvironment. Screening  You may have the following tests or measurements:  Height, weight, and BMI.  Blood pressure.  Lipid and cholesterol levels. These may be checked every 5 years, or more frequently if you are over 70 years old.  Skin check.  Lung cancer screening. You may have this screening every year starting at age 70 if you have a 30-pack-year history of smoking and currently smoke or have quit within the past 15 years.  Fecal occult blood test (FOBT) of the stool. You may have this test every year starting at age 70.  Flexible sigmoidoscopy or colonoscopy. You may have a sigmoidoscopy every 5 years or a colonoscopy every 10 years starting at age 70.  Prostate cancer screening. Recommendations will vary depending on your family history and other risks.  Hepatitis C blood test.  Hepatitis B blood test.  Sexually transmitted disease (STD) testing.  Diabetes screening. This is done by checking your blood sugar (glucose) after you have not eaten for a while (fasting). You may have this done every 1-3 years.  Abdominal aortic aneurysm (AAA) screening. You may need this if you are a current or former smoker.  Osteoporosis. You may be screened starting at age 70 if you are at high risk. Talk with your health care provider about your test results, treatment options,  and if necessary, the need for more tests. Vaccines  Your health care provider may recommend certain vaccines, such as:  Influenza vaccine. This is recommended every year.  Tetanus, diphtheria, and acellular pertussis (Tdap, Td) vaccine. You may need a Td booster every 10 years.   Zoster vaccine. You may need this after age 40.  Pneumococcal 13-valent conjugate (PCV13) vaccine. One dose is recommended after age 33.  Pneumococcal polysaccharide (PPSV23) vaccine. One dose is recommended after age 84. Talk to your health care provider about which screenings and vaccines you need and how often you need them. This information is not intended to replace advice given to you by your health care provider. Make sure you discuss any questions you have with your health care provider. Document Released: 11/22/2015 Document Revised: 07/15/2016 Document Reviewed: 08/27/2015 Elsevier Interactive Patient Education  2017 [place] Prevention in the Home Falls can cause injuries. They can happen to people of all ages. There are many things you can do to make your home safe and to help prevent falls. What can I do on the outside of my home?  Regularly fix the edges of walkways and driveways and fix any cracks.  Remove anything that might make you trip as you walk through a door, such as a raised step or threshold.  Trim any bushes or trees on the path to your home.  Use bright outdoor lighting.  Clear any walking paths of anything that might make someone trip, such as rocks or tools.  Regularly check to see if handrails are loose or broken. Make sure that both sides of any steps have handrails.  Any raised decks and porches should have guardrails on the edges.  Have any leaves, snow, or ice cleared regularly.  Use sand or salt on walking paths during winter.  Clean up any spills in your garage right away. This includes oil or grease spills. What can I do in the bathroom?  Use night lights.  Install grab bars by the toilet and in the tub and shower. Do not use towel bars as grab bars.  Use non-skid mats or decals in the tub or shower.  If you need to sit down in the shower, use a plastic, non-slip stool.  Keep the floor dry. Clean up any water that spills on  the floor as soon as it happens.  Remove soap buildup in the tub or shower regularly.  Attach bath mats securely with double-sided non-slip rug tape.  Do not have throw rugs and other things on the floor that can make you trip. What can I do in the bedroom?  Use night lights.  Make sure that you have a light by your bed that is easy to reach.  Do not use any sheets or blankets that are too big for your bed. They should not hang down onto the floor.  Have a firm chair that has side arms. You can use this for support while you get dressed.  Do not have throw rugs and other things on the floor that can make you trip. What can I do in the kitchen?  Clean up any spills right away.  Avoid walking on wet floors.  Keep items that you use a lot in easy-to-reach places.  If you need to reach something above you, use a strong step stool that has a grab bar.  Keep electrical cords out of the way.  Do not use floor polish or wax that makes floors slippery. If  you must use wax, use non-skid floor wax.  Do not have throw rugs and other things on the floor that can make you trip. What can I do with my stairs?  Do not leave any items on the stairs.  Make sure that there are handrails on both sides of the stairs and use them. Fix handrails that are broken or loose. Make sure that handrails are as long as the stairways.  Check any carpeting to make sure that it is firmly attached to the stairs. Fix any carpet that is loose or worn.  Avoid having throw rugs at the top or bottom of the stairs. If you do have throw rugs, attach them to the floor with carpet tape.  Make sure that you have a light switch at the top of the stairs and the bottom of the stairs. If you do not have them, ask someone to add them for you. What else can I do to help prevent falls?  Wear shoes that:  Do not have high heels.  Have rubber bottoms.  Are comfortable and fit you well.  Are closed at the toe. Do not  wear sandals.  If you use a stepladder:  Make sure that it is fully opened. Do not climb a closed stepladder.  Make sure that both sides of the stepladder are locked into place.  Ask someone to hold it for you, if possible.  Clearly mark and make sure that you can see:  Any grab bars or handrails.  First and last steps.  Where the edge of each step is.  Use tools that help you move around (mobility aids) if they are needed. These include:  Canes.  Walkers.  Scooters.  Crutches.  Turn on the lights when you go into a dark area. Replace any light bulbs as soon as they burn out.  Set up your furniture so you have a clear path. Avoid moving your furniture around.  If any of your floors are uneven, fix them.  If there are any pets around you, be aware of where they are.  Review your medicines with your doctor. Some medicines can make you feel dizzy. This can increase your chance of falling. Ask your doctor what other things that you can do to help prevent falls. This information is not intended to replace advice given to you by your health care provider. Make sure you discuss any questions you have with your health care provider. Document Released: 08/22/2009 Document Revised: 04/02/2016 Document Reviewed: 11/30/2014 Elsevier Interactive Patient Education  2017 [employer].

## 2019-05-30 ENCOUNTER — Encounter: Payer: Self-pay | Admitting: General Surgery

## 2019-06-21 ENCOUNTER — Ambulatory Visit: Payer: Self-pay | Admitting: Family Medicine

## 2019-06-27 DIAGNOSIS — E119 Type 2 diabetes mellitus without complications: Secondary | ICD-10-CM | POA: Diagnosis not present

## 2019-06-27 LAB — HM DIABETES EYE EXAM

## 2019-06-28 ENCOUNTER — Ambulatory Visit: Payer: Medicare Other | Admitting: Family Medicine

## 2019-06-28 ENCOUNTER — Encounter: Payer: Self-pay | Admitting: Family Medicine

## 2019-06-28 ENCOUNTER — Other Ambulatory Visit: Payer: Self-pay

## 2019-06-28 VITALS — BP 122/60 | HR 54 | Temp 98.9°F | Resp 16 | Ht 69.0 in | Wt 236.0 lb

## 2019-06-28 DIAGNOSIS — E6609 Other obesity due to excess calories: Secondary | ICD-10-CM

## 2019-06-28 DIAGNOSIS — I10 Essential (primary) hypertension: Secondary | ICD-10-CM | POA: Diagnosis not present

## 2019-06-28 DIAGNOSIS — Z23 Encounter for immunization: Secondary | ICD-10-CM | POA: Diagnosis not present

## 2019-06-28 DIAGNOSIS — E119 Type 2 diabetes mellitus without complications: Secondary | ICD-10-CM

## 2019-06-28 DIAGNOSIS — E66811 Obesity, class 1: Secondary | ICD-10-CM

## 2019-06-28 DIAGNOSIS — Z6834 Body mass index (BMI) 34.0-34.9, adult: Secondary | ICD-10-CM

## 2019-06-28 DIAGNOSIS — F320 Major depressive disorder, single episode, mild: Secondary | ICD-10-CM | POA: Diagnosis not present

## 2019-06-28 DIAGNOSIS — J4520 Mild intermittent asthma, uncomplicated: Secondary | ICD-10-CM

## 2019-06-28 LAB — POCT GLYCOSYLATED HEMOGLOBIN (HGB A1C): Hemoglobin A1C: 6.3 % — AB (ref 4.0–5.6)

## 2019-06-28 NOTE — Progress Notes (Signed)
Patient: Christian [name] Male    DOB: 08-May-1951   68 y.o.   MRN: [id number] Visit Date: 06/28/2019  Today's Provider: [name], MD   Chief Complaint  Patient presents with  . Hypertension  . Diabetes   Subjective:    HPI    Hypertension, follow-up:  BP Readings from Last 3 Encounters:  06/28/19 122/60  12/21/18 118/70  11/17/18 124/82    He was last seen for hypertension 4 months ago.  BP at that visit was 118/70. Management since that visit includes no changes. He reports good compliance with treatment. He is not having side effects.  He is not exercising. He is adherent to low salt diet.   Outside blood pressures are checked occasionally. He is experiencing none.  Patient denies exertional chest pressure/discomfort, lower extremity edema and palpitations.   Cardiovascular risk factors include diabetes mellitus and dyslipidemia.   Weight trend: stable Wt Readings from Last 3 Encounters:  06/28/19 236 lb (107 kg)  12/21/18 233 lb (105.7 kg)  11/17/18 233 lb (105.7 kg)    Current diet: well balanced    Diabetes Mellitus Type II, Follow-up:   Lab Results  Component Value Date   HGBA1C 6.3 (A) 06/28/2019   HGBA1C 6.0 (H) 12/16/2018   HGBA1C 6.6 (A) 09/06/2018    Last seen for diabetes 4 months ago.  Management since then includes no changes. He reports good compliance with treatment. He is not having side effects.  Current symptoms include none and have been stable. Home blood sugar records: fasting range: 130s  Episodes of hypoglycemia? no   Current insulin regiment: Is not on insulin Most Recent Eye Exam: up to date Weight trend: stable Prior visit with dietician: No Current exercise: no regular exercise Current diet habits: well balanced  Pertinent Labs:    Component Value Date/Time   CHOL 136 12/16/2018 0812   TRIG 129 12/16/2018 0812   HDL 42 12/16/2018 0812   LDLCALC 68 12/16/2018 0812   CREATININE 1.19 12/16/2018  0812    No Known Allergies   Current Outpatient Medications:  .  ALPRAZolam (XANAX) 0.5 MG tablet, Take 1 tablet (0.5 mg total) by mouth 2 (two) times daily as needed., Disp: 60 tablet, Rfl: 5 .  aspirin 81 MG tablet, Take 81 mg by mouth daily. , Disp: , Rfl:  .  atorvastatin (LIPITOR) 40 MG tablet, Take 1 tablet (40 mg total) by mouth at bedtime., Disp: 90 tablet, Rfl: 3 .  cholecalciferol (VITAMIN D) 1000 UNITS tablet, Take 1,000 Units by mouth daily. , Disp: , Rfl:  .  fluticasone furoate-vilanterol (BREO ELLIPTA) 100-25 MCG/INH AEPB, Inhale 1 puff into the lungs daily., Disp: 28 each, Rfl: 12 .  glucosamine-chondroitin 500-400 MG tablet, Take 1 tablet by mouth daily. , Disp: , Rfl:  .  glucose blood (CONTOUR NEXT TEST) test strip, 1 each by Other route daily. Use as instructed, Disp: 100 each, Rfl: 12 .  metFORMIN (GLUCOPHAGE) 500 MG tablet, Take 1 tablet (500 mg total) by mouth daily with breakfast., Disp: 90 tablet, Rfl: 3 .  MICROLET LANCETS MISC, 1 each by Does not apply route daily., Disp: 100 each, Rfl: 3 .  Olmesartan-amLODIPine-HCTZ 40-10-25 MG TABS, TAKE 1 TABLET BY MOUTH DAILY, Disp: 90 tablet, Rfl: 3  Review of Systems  Constitutional: Negative for activity change, fatigue and fever.  Respiratory: Negative for cough, shortness of breath and wheezing.   Cardiovascular: Negative for chest pain, palpitations and leg  swelling.  Endocrine: Negative for cold intolerance, heat intolerance, polydipsia, polyphagia and polyuria.  Neurological: Negative for dizziness, light-headedness and headaches.  Psychiatric/Behavioral: Negative for agitation, self-injury, sleep disturbance and suicidal ideas. The patient is not nervous/anxious.     Social History   Tobacco Use  . Smoking status: Never Smoker  . Smokeless tobacco: Current User    Types: Snuff  . Tobacco comment: uses smokeless tobacco "dip"  Substance Use Topics  . Alcohol use: Yes    Alcohol/week: 8.0 - 10.0 standard  drinks    Types: 8 - 10 Cans of beer per week      Objective:   BP 122/60   Pulse (!) 54   Temp 98.9 F (37.2 C)   Resp 16   Ht 5\' 9"  (1.753 m)   Wt 236 lb (107 kg)   SpO2 99%   BMI 34.85 kg/m  Vitals:   06/28/19 0823  BP: 122/60  Pulse: (!) 54  Resp: 16  Temp: 98.9 F (37.2 C)  SpO2: 99%  Weight: 236 lb (107 kg)  Height: 5\' 9"  (1.753 m)     Physical Exam Vitals signs reviewed.  Constitutional:      Appearance: He is well-developed.  HENT:     Head: Normocephalic and atraumatic.     Right Ear: External ear normal.     Left Ear: External ear normal.     Nose: Nose normal.  Eyes:     Conjunctiva/sclera: Conjunctivae normal.     Pupils: Pupils are equal, round, and reactive to light.  Neck:     Musculoskeletal: Normal range of motion and neck supple.  Cardiovascular:     Rate and Rhythm: Normal rate and regular rhythm.     Heart sounds: Normal heart sounds.  Pulmonary:     Effort: Pulmonary effort is normal.     Breath sounds: Normal breath sounds.  Abdominal:     General: Bowel sounds are normal.     Palpations: Abdomen is soft.  Genitourinary:    Penis: Normal.      Prostate: Normal.     Rectum: Normal.  Musculoskeletal: Normal range of motion.  Skin:    General: Skin is warm and dry.  Neurological:     General: No focal deficit present.     Mental Status: He is alert and oriented to person, place, and time. Mental status is at baseline.  Psychiatric:        Mood and Affect: Mood normal.        Behavior: Behavior normal.        Thought Content: Thought content normal.        Judgment: Judgment normal.      Results for orders placed or performed in visit on 06/28/19  POCT glycosylated hemoglobin (Hb A1C)  Result Value Ref Range   Hemoglobin A1C 6.3 (A) 4.0 - 5.6 %   HbA1c POC (<> result, manual entry)     HbA1c, POC (prediabetic range)     HbA1c, POC (controlled diabetic range)         Assessment & Plan    1. Diabetes mellitus without  complication (HCC) Good control on Metformin. - POCT glycosylated hemoglobin (Hb A1C)--6.3  2. Essential hypertension Controlled on CCB/ARB  3. Depression, major, single episode, mild (HCC) In remission.  4. Class 1 obesity due to excess calories with serious comorbidity and body mass index (BMI) of 34.0 to 34.9 in adult Lifestyle stressed.  5. Mild intermittent reactive airway disease without complication  Controlled.  6. Flu vaccine need  - Flu Vaccine QUAD High Dose(Fluad)     Megan Mansichard Gilbert Jr, MD  [facility]

## 2019-07-19 ENCOUNTER — Other Ambulatory Visit: Payer: Self-pay

## 2019-07-19 DIAGNOSIS — Z1211 Encounter for screening for malignant neoplasm of colon: Secondary | ICD-10-CM

## 2019-08-10 ENCOUNTER — Encounter: Payer: Self-pay | Admitting: *Deleted

## 2019-09-15 ENCOUNTER — Other Ambulatory Visit: Payer: Self-pay

## 2019-09-15 DIAGNOSIS — Z20822 Contact with and (suspected) exposure to covid-19: Secondary | ICD-10-CM

## 2019-09-16 ENCOUNTER — Telehealth: Payer: Self-pay

## 2019-09-16 NOTE — Telephone Encounter (Signed)
Pt's wife called for covid test results- avised results are not back.

## 2019-09-17 LAB — NOVEL CORONAVIRUS, NAA: SARS-CoV-2, NAA: NOT DETECTED

## 2019-10-11 ENCOUNTER — Other Ambulatory Visit: Payer: Self-pay | Admitting: Family Medicine

## 2019-10-11 MED ORDER — METFORMIN HCL 500 MG PO TABS: 500.0000 mg | ORAL_TABLET | Freq: Every day | ORAL | 3 refills | Status: DC

## 2019-10-11 NOTE — Telephone Encounter (Signed)
[facility] faxed refill request for the following medications:   metFORMIN (GLUCOPHAGE) 500 MG tablet    Please advise.  

## 2019-10-23 ENCOUNTER — Other Ambulatory Visit: Payer: Self-pay | Admitting: Family Medicine

## 2019-10-23 DIAGNOSIS — I10 Essential (primary) hypertension: Secondary | ICD-10-CM

## 2019-10-23 MED ORDER — OLMESARTAN-AMLODIPINE-HCTZ 40-10-25 MG PO TABS: 1.0000 | ORAL_TABLET | Freq: Every day | ORAL | 3 refills | Status: DC

## 2019-10-23 MED ORDER — ATORVASTATIN CALCIUM 40 MG PO TABS: 40.0000 mg | ORAL_TABLET | Freq: Every day | ORAL | 3 refills | Status: DC

## 2019-10-23 NOTE — Telephone Encounter (Signed)
[facility] faxed refill request for the following medications:   Olmesartan-amLODIPine-HCTZ 40-10-25 MG TABS  Please advise.  Thanks, [employer]

## 2019-10-23 NOTE — Telephone Encounter (Signed)
  [facility] faxed refill request for the following medications:  atorvastatin (LIPITOR) 40 MG tablet   Please advise. Thanks, [facility]  

## 2019-11-22 ENCOUNTER — Ambulatory Visit: Payer: Self-pay | Admitting: Family Medicine

## 2019-12-05 NOTE — Progress Notes (Signed)
Patient: Christian [name] Male    DOB: May 20, 1951   69 y.o.   MRN: [id number] Visit Date: 12/07/2019  Today's Provider: [name], MD   Chief Complaint  Patient presents with  . Referral   Subjective:     HPI  Patient needs referral for colonoscopy and wants to know if he can get a note to excuse him from jury duty.  He has chronic hearing loss. Overall he is feeling well.  He has no complaints.  He is trying to watch diet and exercise habits but the Covid pandemic has made it harder.  No Known Allergies   Current Outpatient Medications:  .  ALPRAZolam (XANAX) 0.5 MG tablet, Take 1 tablet (0.5 mg total) by mouth 2 (two) times daily as needed., Disp: 60 tablet, Rfl: 5 .  aspirin 81 MG tablet, Take 81 mg by mouth daily. , Disp: , Rfl:  .  atorvastatin (LIPITOR) 40 MG tablet, Take 1 tablet (40 mg total) by mouth at bedtime., Disp: 90 tablet, Rfl: 3 .  cholecalciferol (VITAMIN D) 1000 UNITS tablet, Take 1,000 Units by mouth daily. , Disp: , Rfl:  .  fluticasone furoate-vilanterol (BREO ELLIPTA) 100-25 MCG/INH AEPB, Inhale 1 puff into the lungs daily., Disp: 28 each, Rfl: 12 .  glucosamine-chondroitin 500-400 MG tablet, Take 1 tablet by mouth daily. , Disp: , Rfl:  .  glucose blood (CONTOUR NEXT TEST) test strip, 1 each by Other route daily. Use as instructed, Disp: 100 each, Rfl: 12 .  metFORMIN (GLUCOPHAGE) 500 MG tablet, Take 1 tablet (500 mg total) by mouth daily with breakfast., Disp: 90 tablet, Rfl: 3 .  MICROLET LANCETS MISC, 1 each by Does not apply route daily., Disp: 100 each, Rfl: 3 .  Olmesartan-amLODIPine-HCTZ 40-10-25 MG TABS, Take 1 tablet by mouth daily., Disp: 90 tablet, Rfl: 3  Review of Systems  Constitutional: Negative.   HENT: Positive for hearing loss.   Eyes: Negative.   Respiratory: Negative.   Cardiovascular: Negative.   Endocrine: Negative.   Musculoskeletal: Positive for arthralgias.  Neurological: Negative.   Hematological: Negative.    Psychiatric/Behavioral: Negative.     Social History   Tobacco Use  . Smoking status: Never Smoker  . Smokeless tobacco: Current User    Types: Snuff  . Tobacco comment: uses smokeless tobacco "dip"  Substance Use Topics  . Alcohol use: Yes    Alcohol/week: 8.0 - 10.0 standard drinks    Types: 8 - 10 Cans of beer per week      Objective:   BP 116/65 (BP Location: Right Arm, Patient Position: Sitting, Cuff Size: Large)   Pulse (!) 49   Temp (!) 95.9 F (35.5 C) (Other (Comment))   Wt 235 lb 3.2 oz (106.7 kg)   BMI 34.73 kg/m  Vitals:   12/07/19 1003  BP: 116/65  Pulse: (!) 49  Temp: (!) 95.9 F (35.5 C)  TempSrc: Other (Comment)  Weight: 235 lb 3.2 oz (106.7 kg)  Body mass index is 34.73 kg/m.   Physical Exam Vitals reviewed.  Constitutional:      Appearance: He is well-developed.  HENT:     Head: Normocephalic and atraumatic.     Right Ear: External ear normal.     Left Ear: External ear normal.     Nose: Nose normal.  Eyes:     Conjunctiva/sclera: Conjunctivae normal.     Pupils: Pupils are equal, round, and reactive to light.  Cardiovascular:  Rate and Rhythm: Normal rate and regular rhythm.     Heart sounds: Normal heart sounds.  Pulmonary:     Effort: Pulmonary effort is normal.     Breath sounds: Normal breath sounds.  Abdominal:     General: Bowel sounds are normal.     Palpations: Abdomen is soft.  Genitourinary:    Penis: Normal.      Prostate: Normal.     Rectum: Normal.  Musculoskeletal:        General: Normal range of motion.     Cervical back: Normal range of motion and neck supple.  Skin:    General: Skin is warm and dry.  Neurological:     General: No focal deficit present.     Mental Status: He is alert and oriented to person, place, and time. Mental status is at baseline.  Psychiatric:        Mood and Affect: Mood normal.        Behavior: Behavior normal.        Thought Content: Thought content normal.        Judgment:  Judgment normal.      No results found for any visits on 12/07/19.     Assessment & Plan    1. Colon cancer screening Last colonoscopy October 01, 2009 - Ambulatory referral to Gastroenterology  2. Diabetes mellitus without complication (HCC) Under good control with A1c of 6.6 today - POCT glycosylated hemoglobin (Hb A1C)  3. Essential hypertension Controlled with olmesartan/amlodipine/HCTZ  4. Class 1 obesity due to excess calories with serious comorbidity and body mass index (BMI) of 34.0 to 34.9 in adult With diabetes hypertension hyperlipidemia  5. Hyperlipidemia  Follow up in 6 months for CPE.     Richard Wendelyn Breslow, MD  [facility]

## 2019-12-07 ENCOUNTER — Encounter: Payer: Self-pay | Admitting: Family Medicine

## 2019-12-07 ENCOUNTER — Ambulatory Visit (INDEPENDENT_AMBULATORY_CARE_PROVIDER_SITE_OTHER): Payer: Medicare Other | Admitting: Family Medicine

## 2019-12-07 ENCOUNTER — Other Ambulatory Visit: Payer: Self-pay

## 2019-12-07 VITALS — BP 116/65 | HR 49 | Temp 95.9°F | Wt 235.2 lb

## 2019-12-07 DIAGNOSIS — Z1211 Encounter for screening for malignant neoplasm of colon: Secondary | ICD-10-CM | POA: Diagnosis not present

## 2019-12-07 DIAGNOSIS — E6609 Other obesity due to excess calories: Secondary | ICD-10-CM

## 2019-12-07 DIAGNOSIS — I10 Essential (primary) hypertension: Secondary | ICD-10-CM

## 2019-12-07 DIAGNOSIS — E119 Type 2 diabetes mellitus without complications: Secondary | ICD-10-CM

## 2019-12-07 DIAGNOSIS — E782 Mixed hyperlipidemia: Secondary | ICD-10-CM

## 2019-12-07 DIAGNOSIS — Z6834 Body mass index (BMI) 34.0-34.9, adult: Secondary | ICD-10-CM

## 2019-12-07 LAB — POCT GLYCOSYLATED HEMOGLOBIN (HGB A1C)
Est. average glucose Bld gHb Est-mCnc: 143
Hemoglobin A1C: 6.6 % — AB (ref 4.0–5.6)

## 2019-12-08 ENCOUNTER — Telehealth: Payer: Self-pay

## 2019-12-08 ENCOUNTER — Other Ambulatory Visit: Payer: Self-pay

## 2019-12-08 DIAGNOSIS — Z800 Family history of malignant neoplasm of digestive organs: Secondary | ICD-10-CM

## 2019-12-08 DIAGNOSIS — Z1211 Encounter for screening for malignant neoplasm of colon: Secondary | ICD-10-CM

## 2019-12-08 NOTE — Telephone Encounter (Signed)
Gastroenterology Pre-Procedure Review  Request Date: Tuesday 12/26/19 Requesting Physician: Dr. Tobi Bastos  PATIENT REVIEW QUESTIONS: The patient responded to the following health history questions as indicated:    1. Are you having any GI issues? no 2. Do you have a personal history of Polyps? no 3. Do you have a family history of Colon Cancer or Polyps? yes (colon cancer-father) 4. Diabetes Mellitus? no 5. Joint replacements in the past 12 months?no 6. Major health problems in the past 3 months?no 7. Any artificial heart valves, MVP, or defibrillator?no    MEDICATIONS & ALLERGIES:    Patient reports the following regarding taking any anticoagulation/antiplatelet therapy:   Plavix, Coumadin, Eliquis, Xarelto, Lovenox, Pradaxa, Brilinta, or Effient? no Aspirin? no  Patient confirms/reports the following medications:  Current Outpatient Medications  Medication Sig Dispense Refill  . ALPRAZolam (XANAX) 0.5 MG tablet Take 1 tablet (0.5 mg total) by mouth 2 (two) times daily as needed. 60 tablet 5  . aspirin 81 MG tablet Take 81 mg by mouth daily.     Marland Kitchen atorvastatin (LIPITOR) 40 MG tablet Take 1 tablet (40 mg total) by mouth at bedtime. 90 tablet 3  . cholecalciferol (VITAMIN D) 1000 UNITS tablet Take 1,000 Units by mouth daily.     . fluticasone furoate-vilanterol (BREO ELLIPTA) 100-25 MCG/INH AEPB Inhale 1 puff into the lungs daily. 28 each 12  . glucosamine-chondroitin 500-400 MG tablet Take 1 tablet by mouth daily.     Marland Kitchen glucose blood (CONTOUR NEXT TEST) test strip 1 each by Other route daily. Use as instructed 100 each 12  . metFORMIN (GLUCOPHAGE) 500 MG tablet Take 1 tablet (500 mg total) by mouth daily with breakfast. 90 tablet 3  . MICROLET LANCETS MISC 1 each by Does not apply route daily. 100 each 3  . Olmesartan-amLODIPine-HCTZ 40-10-25 MG TABS Take 1 tablet by mouth daily. 90 tablet 3   No current facility-administered medications for this visit.    Patient confirms/reports the  following allergies:  No Known Allergies  No orders of the defined types were placed in this encounter.   AUTHORIZATION INFORMATION Primary Insurance: 1D#: Group #:  Secondary Insurance: 1D#: Group #:  SCHEDULE INFORMATION: Date: 12/26/19 Time: Location:[facility]

## 2019-12-11 ENCOUNTER — Other Ambulatory Visit: Payer: Self-pay

## 2019-12-12 ENCOUNTER — Other Ambulatory Visit: Payer: Self-pay | Admitting: Family Medicine

## 2019-12-12 DIAGNOSIS — J4520 Mild intermittent asthma, uncomplicated: Secondary | ICD-10-CM

## 2019-12-12 NOTE — Telephone Encounter (Signed)
Requested medication (s) are due for refill today: Yes  Requested medication (s) are on the active medication list: Yes  Last refill:  12/08/18  Future visit scheduled: Yes  Notes to clinic:  Prescription has expired.    Requested Prescriptions  Pending Prescriptions Disp Refills   BREO ELLIPTA 100-25 MCG/INH AEPB [Pharmacy Med Name: BREO ELLIPTA 100-25MCG ORAL INH(30)] 60 each     Sig: INHALE 1 PUFF INTO THE LUNGS DAILY      Pulmonology:  [employer] Passed - 12/12/2019 10:58 AM      Passed - Valid encounter within last 12 months    Recent Outpatient Visits           5 days ago Diabetes mellitus without complication [facility])   [facility] Maple Hudson., MD   5 months ago Diabetes mellitus without complication [facility])   [facility] Maple Hudson., MD   11 months ago Essential hypertension   [facility] Maple Hudson., MD   1 year ago Shortness of breath   [facility] Maple Hudson., MD   1 year ago Blood glucose elevated   [facility] Maple Hudson., MD

## 2019-12-25 ENCOUNTER — Other Ambulatory Visit: Payer: Self-pay | Admitting: Family Medicine

## 2019-12-25 DIAGNOSIS — I10 Essential (primary) hypertension: Secondary | ICD-10-CM

## 2019-12-25 NOTE — Telephone Encounter (Signed)
Medication Refill - Medication: atorvastatin, olmesartan  Has the patient contacted their pharmacy? Yes.   (Agent: If no, request that the patient contact the pharmacy for the refill.) (Agent: If yes, when and what did the pharmacy advise?)  Preferred Pharmacy (with phone number or street name):  [facility][id number] Nicholes Rough, [place] - [address] AT [facility] OF [address]  [address] [place] [address]  Phone: [phone] Fax: [phone]  Not a 24 hour pharmacy; exact hours not known.     Agent: Please be advised that RX refills may take up to 3 business days. We ask that you follow-up with your pharmacy.

## 2019-12-26 ENCOUNTER — Ambulatory Visit: Admit: 2019-12-26 | Payer: Medicare Other | Admitting: Gastroenterology

## 2019-12-29 ENCOUNTER — Other Ambulatory Visit: Payer: Self-pay | Admitting: Family Medicine

## 2019-12-29 DIAGNOSIS — I10 Essential (primary) hypertension: Secondary | ICD-10-CM

## 2020-01-01 ENCOUNTER — Other Ambulatory Visit: Payer: Self-pay | Admitting: Family Medicine

## 2020-01-01 ENCOUNTER — Telehealth: Payer: Self-pay

## 2020-01-01 DIAGNOSIS — I10 Essential (primary) hypertension: Secondary | ICD-10-CM

## 2020-01-01 MED ORDER — OLMESARTAN-AMLODIPINE-HCTZ 40-10-25 MG PO TABS: 1.0000 | ORAL_TABLET | Freq: Every day | ORAL | 3 refills | Status: DC

## 2020-01-01 MED ORDER — ATORVASTATIN CALCIUM 40 MG PO TABS: 40.0000 mg | ORAL_TABLET | Freq: Every day | ORAL | 3 refills | Status: DC

## 2020-01-01 NOTE — Telephone Encounter (Signed)
Copied from CRM [id number]. Topic: General - Other >> Jan 01, 2020  8:43 AM Jaquita Rector A wrote: Reason for CRM: Patient wife Clydie Braun called to inquire of a refill request that was made on Olmesartan-amLODIPine-HCTZ 40-10-25 MG TABS and atorvastatin (LIPITOR) 40 MG tablet Rxes sent to [facility] on 10/13/19 was not received but she is needing these sent to the [facility] #[id number] Nicholes Rough, [place] - [address] AT [facility] OF [address]  Phone:  [phone] Fax:  [phone]

## 2020-01-01 NOTE — Telephone Encounter (Signed)
Copied from CRM [id number]. Topic: Quick Communication - Rx Refill/Question >> Jan 01, 2020  8:47 AM Jaquita Rector A wrote: Medication: atorvastatin (LIPITOR) 40 MG tablet, Olmesartan-amLODIPine-HCTZ 40-10-25 MG TABS   Has the patient contacted their pharmacy? Yes.   (Agent: If no, request that the patient contact the pharmacy for the refill.) (Agent: If yes, when and what did the pharmacy advise?)  Preferred Pharmacy (with phone number or street name): [facility] #[id number] Nicholes Rough, [place] - [address] AT [facility] OF [address]  Phone:  [phone] Fax:  [phone]     Agent: Please be advised that RX refills may take up to 3 business days. We ask that you follow-up with your pharmacy.

## 2020-01-01 NOTE — Telephone Encounter (Signed)
Call to patient- medication needs to be sent to [facility]- patient is up-to-date on visit- he does need labs to meet protocol- but there is no order for that. Please send medication to pharmacy and put order in for labs so patient can be compliant. ( He is out of medication presently)

## 2020-01-01 NOTE — Telephone Encounter (Signed)
Copied from CRM [id number]. Topic: General - Other >> Jan 01, 2020  8:43 AM Jaquita Rector A wrote: Reason for CRM: Patient wife Clydie Braun called to inquire of a refill request that was made on Olmesartan-amLODIPine-HCTZ 40-10-25 MG TABS and atorvastatin (LIPITOR) 40 MG tablet Rxes sent to [facility] on 10/13/19 was not received but she is needing these sent to the [facility]ORE #[id number] Nicholes Rough, [place] - [address] AT [facility] [address]  Phone:  [phone] Fax:  [phone] >> Jan 01, 2020  8:50 AM Darron Doom wrote: Per Mrs Clowers patient is all out of medication and need refills today please. Ph# [phone]

## 2020-01-10 ENCOUNTER — Other Ambulatory Visit: Payer: Self-pay

## 2020-01-10 ENCOUNTER — Other Ambulatory Visit
Admission: RE | Admit: 2020-01-10 | Discharge: 2020-01-10 | Disposition: A | Discharge status: Home / Self Care | Payer: Medicare Other | Source: Ambulatory Visit | Attending: Gastroenterology | Admitting: Gastroenterology

## 2020-01-10 DIAGNOSIS — Z01812 Encounter for preprocedural laboratory examination: Secondary | ICD-10-CM | POA: Diagnosis not present

## 2020-01-10 DIAGNOSIS — Z20822 Contact with and (suspected) exposure to covid-19: Secondary | ICD-10-CM | POA: Insufficient documentation

## 2020-01-10 LAB — SARS CORONAVIRUS 2 (TAT 6-24 HRS): SARS Coronavirus 2: NEGATIVE

## 2020-01-12 ENCOUNTER — Ambulatory Visit
Admission: RE | Admit: 2020-01-12 | Discharge: 2020-01-12 | Disposition: A | Discharge status: Home / Self Care | Payer: Medicare Other | Attending: Gastroenterology | Admitting: Gastroenterology

## 2020-01-12 ENCOUNTER — Ambulatory Visit: Payer: Medicare Other | Admitting: Registered Nurse

## 2020-01-12 ENCOUNTER — Encounter
Admission: RE | Disposition: A | Discharge status: Home / Self Care | Payer: Self-pay | Source: Home / Self Care | Attending: Gastroenterology

## 2020-01-12 ENCOUNTER — Other Ambulatory Visit: Payer: Self-pay

## 2020-01-12 DIAGNOSIS — K635 Polyp of colon: Secondary | ICD-10-CM | POA: Insufficient documentation

## 2020-01-12 DIAGNOSIS — K640 First degree hemorrhoids: Secondary | ICD-10-CM | POA: Insufficient documentation

## 2020-01-12 DIAGNOSIS — Z79899 Other long term (current) drug therapy: Secondary | ICD-10-CM | POA: Insufficient documentation

## 2020-01-12 DIAGNOSIS — F329 Major depressive disorder, single episode, unspecified: Secondary | ICD-10-CM | POA: Insufficient documentation

## 2020-01-12 DIAGNOSIS — Z800 Family history of malignant neoplasm of digestive organs: Secondary | ICD-10-CM | POA: Insufficient documentation

## 2020-01-12 DIAGNOSIS — Z7951 Long term (current) use of inhaled steroids: Secondary | ICD-10-CM | POA: Diagnosis not present

## 2020-01-12 DIAGNOSIS — E119 Type 2 diabetes mellitus without complications: Secondary | ICD-10-CM | POA: Diagnosis not present

## 2020-01-12 DIAGNOSIS — Z820 Family history of epilepsy and other diseases of the nervous system: Secondary | ICD-10-CM | POA: Diagnosis not present

## 2020-01-12 DIAGNOSIS — R001 Bradycardia, unspecified: Secondary | ICD-10-CM | POA: Insufficient documentation

## 2020-01-12 DIAGNOSIS — I10 Essential (primary) hypertension: Secondary | ICD-10-CM | POA: Insufficient documentation

## 2020-01-12 DIAGNOSIS — Z7984 Long term (current) use of oral hypoglycemic drugs: Secondary | ICD-10-CM | POA: Insufficient documentation

## 2020-01-12 DIAGNOSIS — Z8249 Family history of ischemic heart disease and other diseases of the circulatory system: Secondary | ICD-10-CM | POA: Insufficient documentation

## 2020-01-12 DIAGNOSIS — K5730 Diverticulosis of large intestine without perforation or abscess without bleeding: Secondary | ICD-10-CM | POA: Diagnosis not present

## 2020-01-12 DIAGNOSIS — K5790 Diverticulosis of intestine, part unspecified, without perforation or abscess without bleeding: Secondary | ICD-10-CM | POA: Diagnosis not present

## 2020-01-12 DIAGNOSIS — E785 Hyperlipidemia, unspecified: Secondary | ICD-10-CM | POA: Insufficient documentation

## 2020-01-12 DIAGNOSIS — Z7982 Long term (current) use of aspirin: Secondary | ICD-10-CM | POA: Insufficient documentation

## 2020-01-12 DIAGNOSIS — Z1211 Encounter for screening for malignant neoplasm of colon: Secondary | ICD-10-CM | POA: Insufficient documentation

## 2020-01-12 DIAGNOSIS — F419 Anxiety disorder, unspecified: Secondary | ICD-10-CM | POA: Insufficient documentation

## 2020-01-12 HISTORY — PX: COLONOSCOPY WITH PROPOFOL: SHX5780

## 2020-01-12 LAB — GLUCOSE, CAPILLARY: Glucose-Capillary: 132 mg/dL — ABNORMAL HIGH (ref 70–99)

## 2020-01-12 SURGERY — COLONOSCOPY WITH PROPOFOL
Anesthesia: General

## 2020-01-12 MED ORDER — EPHEDRINE SULFATE 50 MG/ML IJ SOLN
INTRAMUSCULAR | Status: DC | PRN
  Administered 2020-01-12: 5 mg via INTRAVENOUS

## 2020-01-12 MED ORDER — PROPOFOL 10 MG/ML IV BOLUS
INTRAVENOUS | Status: DC | PRN
  Administered 2020-01-12: 90 mg via INTRAVENOUS
  Administered 2020-01-12: 30 mg via INTRAVENOUS

## 2020-01-12 MED ORDER — SODIUM CHLORIDE 0.9 % IV SOLN
INTRAVENOUS | Status: DC
  Administered 2020-01-12: 1000 mL via INTRAVENOUS

## 2020-01-12 MED ORDER — PROPOFOL 500 MG/50ML IV EMUL
INTRAVENOUS | Status: DC | PRN
  Administered 2020-01-12: 140 ug/kg/min via INTRAVENOUS

## 2020-01-12 NOTE — Op Note (Signed)
[facility] Gastroenterology Patient Name: Christian [name] Procedure Date: 01/12/2020 9:02 AM MRN: [id number] Account #: [id number] Date of Birth: 03/02/1951 Admit Type: Outpatient Age: 69 Room: [facility] ENDO ROOM 4 Gender: Male Note Status: Finalized Procedure:             Colonoscopy Indications:           Screening for colorectal malignant neoplasm Providers:             Lucilla Lame MD, MD Referring MD:          Janine Ores. Rosanna Randy, MD (Referring MD) Medicines:             Propofol per Anesthesia Complications:         No immediate complications. Procedure:             Pre-Anesthesia Assessment:                        - Prior to the procedure, a History and Physical was                         performed, and patient medications and allergies were                         reviewed. The patient's tolerance of previous                         anesthesia was also reviewed. The risks and benefits                         of the procedure and the sedation options and risks                         were discussed with the patient. All questions were                         answered, and informed consent was obtained. Prior                         Anticoagulants: The patient has taken no previous                         anticoagulant or antiplatelet agents. ASA Grade                         Assessment: II - A patient with mild systemic disease.                         After reviewing the risks and benefits, the patient                         was deemed in satisfactory condition to undergo the                         procedure.                        After obtaining informed consent, the colonoscope was  passed under direct vision. Throughout the procedure,                         the patient's blood pressure, pulse, and oxygen                         saturations were monitored continuously. The                         Colonoscope was introduced through  the anus and                         advanced to the the cecum, identified by appendiceal                         orifice and ileocecal valve. The colonoscopy was                         performed without difficulty. The patient tolerated                         the procedure well. The quality of the bowel                         preparation was excellent. Findings:      The perianal and digital rectal examinations were normal.      A 3 mm polyp was found in the ileocecal valve. The polyp was sessile.       The polyp was removed with a cold biopsy forceps. Resection and       retrieval were complete.      Multiple small-mouthed diverticula were found in the sigmoid colon.      Non-bleeding internal hemorrhoids were found during retroflexion. The       hemorrhoids were Grade I (internal hemorrhoids that do not prolapse). Impression:            - One 3 mm polyp at the ileocecal valve, removed with                         a cold biopsy forceps. Resected and retrieved.                        - Diverticulosis in the sigmoid colon.                        - Non-bleeding internal hemorrhoids. Recommendation:        - Discharge patient to home.                        - Resume previous diet.                        - Continue present medications.                        - Await pathology results. Procedure Code(s):     --- Professional ---                        [phone], Colonoscopy, flexible; with biopsy, single or  multiple Diagnosis Code(s):     --- Professional ---                        Z12.11, Encounter for screening for malignant neoplasm                         of colon                        K63.5, Polyp of colon CPT copyright 2019 American Medical Association. All rights reserved. The codes documented in this report are preliminary and upon coder review may  be revised to meet current compliance requirements. Midge Minium MD, MD 01/12/2020 9:16:52 AM This report has  been signed electronically. Number of Addenda: 0 Note Initiated On: 01/12/2020 9:02 AM Scope Withdrawal Time: 0 hours 7 minutes 7 seconds  Total Procedure Duration: 0 hours 9 minutes 19 seconds  Estimated Blood Loss:  Estimated blood loss: none.      [facility]

## 2020-01-12 NOTE — Anesthesia Postprocedure Evaluation (Signed)
Anesthesia Post Note  Patient: Christian [name]  Procedure(s) Performed: COLONOSCOPY WITH PROPOFOL (N/A )  Patient location during evaluation: Endoscopy Anesthesia Type: General Level of consciousness: awake and alert Pain management: pain level controlled Vital Signs Assessment: post-procedure vital signs reviewed and stable Respiratory status: spontaneous breathing, nonlabored ventilation, respiratory function stable and patient connected to nasal cannula oxygen Cardiovascular status: blood pressure returned to baseline and stable Postop Assessment: no apparent nausea or vomiting Anesthetic complications: no     Last Vitals:  Vitals:   01/12/20 0935 01/12/20 0945  BP: 109/87 (!) 141/69  Pulse:    Resp:    Temp:    SpO2:      Last Pain:  Vitals:   01/12/20 0945  TempSrc:   PainSc: 0-No pain                 Corinda Gubler

## 2020-01-12 NOTE — H&P (Signed)
Midge Minium, MD [facility] [address], [address] [place], [place] [address] Phone: [phone] Fax : [phone]  Primary Care Physician:  Maple Hudson., MD Primary Gastroenterologist:  Dr. Servando Snare  Pre-Procedure History & Physical: HPI:  Christian [name] is a 69 y.o. male is here for a screening colonoscopy.   Past Medical History:  Diagnosis Date  . Anxiety   . Bradycardia   . Hyperlipidemia   . Hypertension     Past Surgical History:  Procedure Laterality Date  . INGUINAL HERNIA REPAIR Left   . vasectomy  1978    Prior to Admission medications   Medication Sig Start Date End Date Taking? Authorizing Provider  ALPRAZolam Prudy Feeler) 0.5 MG tablet Take 1 tablet (0.5 mg total) by mouth 2 (two) times daily as needed. 11/30/18  Yes Maple Hudson., MD  aspirin 81 MG tablet Take 81 mg by mouth daily.  02/11/12  Yes [provider]  atorvastatin (LIPITOR) 40 MG tablet Take 1 tablet (40 mg total) by mouth at bedtime. 01/01/20  Yes Maple Hudson., MD  BREO ELLIPTA 100-25 MCG/INH AEPB INHALE 1 PUFF INTO THE LUNGS DAILY 12/12/19  Yes Maple Hudson., MD  cholecalciferol (VITAMIN D) 1000 UNITS tablet Take 1,000 Units by mouth daily.  02/11/12  Yes [provider]  glucosamine-chondroitin 500-400 MG tablet Take 1 tablet by mouth daily.    Yes [provider]  glucose blood (CONTOUR NEXT TEST) test strip 1 each by Other route daily. Use as instructed 10/17/18  Yes Maple Hudson., MD  metFORMIN (GLUCOPHAGE) 500 MG tablet Take 1 tablet (500 mg total) by mouth daily with breakfast. 10/11/19  Yes Maple Hudson., MD  MICROLET LANCETS MISC 1 each by Does not apply route daily. 09/06/18  Yes Maple Hudson., MD  Olmesartan-amLODIPine-HCTZ 40-10-25 MG TABS Take 1 tablet by mouth daily. 01/01/20  Yes Maple Hudson., MD    Allergies as of 12/11/2019  . (No Known Allergies)    Family History  Problem Relation Age of Onset   . Dementia Mother   . Colon cancer Father   . Hypertension Sister   . Hypertension Son     Social History   Socioeconomic History  . Marital status: Married    Spouse name: Not on file  . Number of children: 3  . Years of education: Not on file  . Highest education level: 12th grade  Occupational History  . 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 Smoking status: Never Smoker  . Smokeless tobacco: Current User    Types: Snuff  . Tobacco comment: uses smokeless tobacco "dip"  Substance and Sexual Activity  . Alcohol use: Yes    Alcohol/week: 8.0 - 10.0 standard drinks    Types: 8 - 10 Cans of beer per week  . Drug use: No  . Sexual activity: Not on file  Other Topics Concern  . Not on file  Social History Narrative  . Not on file   Social Determinants of Health   Financial Resource Strain:   . Difficulty of Paying Living Expenses: Not on file  Food Insecurity:   . Worried About Programme researcher, broadcasting/film/video in the Last Year: Not on file  . Ran Out of Food in the Last Year: Not on file  Transportation Needs:   . Lack of Transportation (Medical): Not on file  . Lack  of Transportation (Non-Medical): Not on file  Physical Activity: Inactive  . Days of Exercise per Week: 0 days  . Minutes of Exercise per Session: 0 min  Stress: No Stress Concern Present  . Feeling of Stress : Not at all  Social Connections: Unknown  . Frequency of Communication with Friends and Family: Patient refused  . Frequency of Social Gatherings with Friends and Family: Patient refused  . Attends Religious Services: Patient refused  . Active Member of Clubs or Organizations: Patient refused  . Attends Archivist Meetings: Patient refused  . Marital Status: Patient refused  Intimate Partner Violence: Unknown  . Fear of Current or Ex-Partner: Patient refused  . Emotionally Abused: Patient refused  . Physically Abused: Patient refused  . Sexually  Abused: Patient refused    Review of Systems: See HPI, otherwise negative ROS  Physical Exam: BP 113/73   Pulse 63   Temp (!) 96.1 F (35.6 C) (Temporal)   Resp 18   Ht 5\' 10"  (1.778 m)   Wt 100.2 kg   SpO2 99%   BMI 31.71 kg/m  General:   Alert,  pleasant and cooperative in NAD Head:  Normocephalic and atraumatic. Neck:  Supple; no masses or thyromegaly. Lungs:  Clear throughout to auscultation.    Heart:  Regular rate and rhythm. Abdomen:  Soft, nontender and nondistended. Normal bowel sounds, without guarding, and without rebound.   Neurologic:  Alert and  oriented x4;  grossly normal neurologically.  Impression/Plan: [name] is now here to undergo a screening colonoscopy.  Risks, benefits, and alternatives regarding colonoscopy have been reviewed with the patient.  Questions have been answered.  All parties agreeable.

## 2020-01-12 NOTE — Anesthesia Preprocedure Evaluation (Signed)
Anesthesia Evaluation  Patient identified by MRN, date of birth, ID band Patient awake    Reviewed: Allergy & Precautions, NPO status , Patient's Chart, lab work & pertinent test results  History of Anesthesia Complications Negative for: history of anesthetic complications  Airway Mallampati: II  TM Distance: >3 FB Neck ROM: Full   Comment: Large neck Dental no notable dental hx. (+) Teeth Intact   Pulmonary asthma , neg sleep apnea, neg COPD, Patient abstained from smoking.Not current smoker,  Says he has some kind of chronic bronchitis and uses an inhaler on average once a day.   Pulmonary exam normal breath sounds clear to auscultation       Cardiovascular Exercise Tolerance: Good METShypertension, Pt. on medications (-) CAD and (-) Past MI negative cardio ROS  (-) dysrhythmias  Rhythm:Regular Rate:Normal - Systolic murmurs    Neuro/Psych PSYCHIATRIC DISORDERS Anxiety Depression negative neurological ROS  negative psych ROS   GI/Hepatic GERD  Controlled,(+)     (-) substance abuse  ,   Endo/Other  diabetes, Type 2, Oral Hypoglycemic Agents  Renal/GU negative Renal ROS     Musculoskeletal   Abdominal   Peds  Hematology   Anesthesia Other Findings Past Medical History: No date: Anxiety No date: Bradycardia No date: Hyperlipidemia No date: Hypertension  Reproductive/Obstetrics                             Anesthesia Physical Anesthesia Plan  ASA: II  Anesthesia Plan: General   Post-op Pain Management:    Induction: Intravenous  PONV Risk Score and Plan: 2 and Ondansetron, Propofol infusion and TIVA  Airway Management Planned: Nasal Cannula  Additional Equipment: None  Intra-op Plan:   Post-operative Plan:   Informed Consent: I have reviewed the patients History and Physical, chart, labs and discussed the procedure including the risks, benefits and alternatives for the  proposed anesthesia with the patient or authorized representative who has indicated his/her understanding and acceptance.     Dental advisory given  Plan Discussed with: CRNA and Surgeon  Anesthesia Plan Comments: (Discussed risks of anesthesia with patient, including possibility of difficulty with spontaneous ventilation under anesthesia necessitating airway intervention, PONV, and rare risks such as cardiac or respiratory or neurological events. Patient understands.)        Anesthesia Quick Evaluation

## 2020-01-12 NOTE — Transfer of Care (Signed)
Immediate Anesthesia Transfer of Care Note  Patient: Christian [name]  Procedure(s) Performed: COLONOSCOPY WITH PROPOFOL (N/A )  Patient Location: PACU  Anesthesia Type:General  Level of Consciousness: awake, alert  and oriented  Airway & Oxygen Therapy: Patient Spontanous Breathing  Post-op Assessment: Report given to RN and Post -op Vital signs reviewed and stable  Post vital signs: Reviewed and stable  Last Vitals:  Vitals Value Taken Time  BP 75/41 01/12/20 0919  Temp    Pulse 59 01/12/20 0920  Resp 14 01/12/20 0920  SpO2 95 % 01/12/20 0920  Vitals shown include unvalidated device data.  Last Pain:  Vitals:   01/12/20 0820  TempSrc: Temporal  PainSc: 0-No pain         Complications: No apparent anesthesia complications

## 2020-01-15 ENCOUNTER — Encounter: Payer: Self-pay | Admitting: *Deleted

## 2020-01-16 LAB — SURGICAL PATHOLOGY

## 2020-01-17 ENCOUNTER — Encounter: Payer: Self-pay | Admitting: Gastroenterology

## 2020-03-27 ENCOUNTER — Other Ambulatory Visit: Payer: Self-pay | Admitting: Family Medicine

## 2020-03-27 DIAGNOSIS — J4520 Mild intermittent asthma, uncomplicated: Secondary | ICD-10-CM

## 2020-06-13 ENCOUNTER — Telehealth: Payer: Self-pay

## 2020-06-13 NOTE — Telephone Encounter (Signed)
Copied from CRM [id number]. Topic: Appointment Scheduling - Scheduling Inquiry for Clinic >> Jun 13, 2020  8:55 AM Crist Infante wrote: Reason for CRM: pt is going out of town and has to cancel his cpe for 8/9.  Next available is 11/04.  Pt wants to know if Dr Sullivan Lone will work him in sooner?

## 2020-06-17 ENCOUNTER — Encounter: Payer: Medicare Other | Admitting: Family Medicine

## 2020-06-23 ENCOUNTER — Encounter: Payer: Self-pay | Admitting: Family Medicine

## 2020-06-25 ENCOUNTER — Encounter: Payer: Self-pay | Admitting: Family Medicine

## 2020-06-25 NOTE — Progress Notes (Signed)
Subjective:   Christian [name] is a 69 y.o. male who presents for Medicare Annual/Subsequent preventive examination.  I connected with Naftoli Penny today by telephone and verified that I am speaking with the correct person using two identifiers. Location patient: home Location provider: work Persons participating in the virtual visit: patient, provider.   I discussed the limitations, risks, security and privacy concerns of performing an evaluation and management service by telephone and the availability of in person appointments. I also discussed with the patient that there may be a patient responsible charge related to this service. The patient expressed understanding and verbally consented to this telephonic visit.    Interactive audio and video telecommunications were attempted between this provider and patient, however failed, due to patient having technical difficulties OR patient did not have access to video capability.  We continued and completed visit with audio only.   Review of Systems    N/A  Cardiac Risk Factors include: advanced age (>1men, >52 women);male gender;dyslipidemia;hypertension;Other (see comment), Risk factor comments: Prediabetic     Objective:    There were no vitals filed for this visit. There is no height or weight on file to calculate BMI.  Advanced Directives 06/26/2020 01/12/2020 05/18/2019 01/25/2018 01/19/2017 01/07/2016  Does Patient Have a Medical Advance Directive? Yes Yes No No No No  Type of Estate agent of [place];Living will Living will - - - -  Copy of Healthcare Power of Attorney in Chart? No - copy requested - - - - -  Would patient like information on creating a medical advance directive? - - No - Patient declined - No - Patient declined -    Current Medications (verified) Outpatient Encounter Medications as of 06/26/2020  Medication Sig  . aspirin 81 MG tablet Take 81 mg by mouth daily.   Marland Kitchen atorvastatin  (LIPITOR) 40 MG tablet Take 1 tablet (40 mg total) by mouth at bedtime.  Marland Kitchen BREO ELLIPTA 100-25 MCG/INH AEPB INHALE 1 PUFF INTO THE LUNGS DAILY  . cholecalciferol (VITAMIN D) 1000 UNITS tablet Take 1,000 Units by mouth daily.   Marland Kitchen glucosamine-chondroitin 500-400 MG tablet Take 1 tablet by mouth daily.   Marland Kitchen glucose blood (CONTOUR NEXT TEST) test strip 1 each by Other route daily. Use as instructed  . metFORMIN (GLUCOPHAGE) 500 MG tablet Take 1 tablet (500 mg total) by mouth daily with breakfast.  . MICROLET LANCETS MISC 1 each by Does not apply route daily.  . Olmesartan-amLODIPine-HCTZ 40-10-25 MG TABS Take 1 tablet by mouth daily.  Marland Kitchen ALPRAZolam (XANAX) 0.5 MG tablet Take 1 tablet (0.5 mg total) by mouth 2 (two) times daily as needed. (Patient not taking: Reported on 06/26/2020)   No facility-administered encounter medications on file as of 06/26/2020.    Allergies (verified) Patient has no known allergies.   History: Past Medical History:  Diagnosis Date  . Anxiety   . Bradycardia   . Hyperlipidemia   . Hypertension   . Prediabetes    Past Surgical History:  Procedure Laterality Date  . COLONOSCOPY WITH PROPOFOL N/A 01/12/2020   Procedure: COLONOSCOPY WITH PROPOFOL;  Surgeon: Midge Minium, MD;  Location: [facility] END[facility]COPY;  Service: Endoscopy;  Laterality: N/A;  . INGUINAL HERNIA REPAIR Left   . vasectomy  12   Family History  Problem Relation Age of Onset  . Dementia Mother   . Colon cancer Father   . Hypertension Sister   . Hypertension Son    Social History   Socioeconomic History  .  Marital status: Married    Spouse name: Not on file  . Number of children: 3  . Years of education: Not on file  . Highest education level: 12th grade  Occupational History  . 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 Smoking status: Never Smoker  . Smokeless tobacco: Current User    Types: Snuff  . Tobacco comment: uses smokeless tobacco "dip"    Vaping Use  . Vaping Use: Never used  Substance and Sexual Activity  . Alcohol use: Yes    Alcohol/week: 15.0 standard drinks    Types: 15 Cans of beer per week    Comment: 3/day x 5  . Drug use: No  . Sexual activity: Not on file  Other Topics Concern  . Not on file  Social History Narrative  . Not on file   Social Determinants of Health   Financial Resource Strain: Low Risk   . Difficulty of Paying Living Expenses: Not hard at all  Food Insecurity: No Food Insecurity  . Worried About Programme researcher, broadcasting/film/video in the Last Year: Never true  . Ran Out of Food in the Last Year: Never true  Transportation Needs: No Transportation Needs  . Lack of Transportation (Medical): No  . Lack of Transportation (Non-Medical): No  Physical Activity: Inactive  . Days of Exercise per Week: 0 days  . Minutes of Exercise per Session: 0 min  Stress: No Stress Concern Present  . Feeling of Stress : Not at all  Social Connections: Moderately Integrated  . Frequency of Communication with Friends and Family: More than three times a week  . Frequency of Social Gatherings with Friends and Family: More than three times a week  . Attends Religious Services: More than 4 times per year  . Active Member of Clubs or Organizations: No  . Attends Banker Meetings: Never  . Marital Status: Married    Tobacco Counseling Ready to quit: No Counseling given: No Comment: uses smokeless tobacco "dip"   Clinical Intake:  Pre-visit preparation completed: Yes  Pain : No/denies pain     Nutritional Risks: None Diabetes: No (Prediabetes)  How often do you need to have someone help you when you read instructions, pamphlets, or other written materials from your doctor or pharmacy?: 1 - Never  Diabetic? No  Interpreter Needed?: No  Information entered by :: [facility], LPN   Activities of Daily Living In your present state of health, do you have any difficulty performing the following  activities: 06/26/2020  Hearing? N  Vision? N  Difficulty concentrating or making decisions? N  Walking or climbing stairs? N  Dressing or bathing? N  Doing errands, shopping? N  Preparing Food and eating ? N  Using the Toilet? N  In the past six months, have you accidently leaked urine? N  Do you have problems with loss of bowel control? N  Managing your Medications? N  Managing your Finances? N  Housekeeping or managing your Housekeeping? N  Some recent data might be hidden    Patient Care Team: Maple Hudson., MD as PCP - General (Family Medicine) Lemar Livings, Merrily Pew, MD as Consulting Physician (General Surgery) [facility], [facility]) Midge Minium, MD as Consulting Physician (Gastroenterology)  Indicate any recent Medical Services you may have received from other than [facility] providers in the past year (date may be approximate).     Assessment:   This is a  routine wellness examination for [name].  Hearing/Vision screen No exam data present  Dietary issues and exercise activities discussed: Current Exercise Habits: The patient does not participate in regular exercise at present, Exercise limited by: None identified  Goals    . DIET - REDUCE PORTION SIZE     Recommend decreasing portion sizes by half and eating 3 small meals a day with 2 healthy snacks in between.       Depression Screen PHQ 2/9 Scores 06/26/2020 05/18/2019 01/25/2018 01/25/2018 01/19/2017 01/19/2017 01/07/2016  PHQ - 2 Score 0 0 0 0 0 0 0  PHQ- 9 Score - - 0 - 0 - -    Fall Risk Fall Risk  06/26/2020 05/18/2019 01/25/2018 01/19/2017 01/07/2016  Falls in the past year? 0 0 No No No  Number falls in past yr: 0 - - - -  Injury with Fall? 0 - - - -    Any stairs in or around the home? Yes  If so, are there any without handrails? No  Home free of loose throw rugs in walkways, pet beds, electrical cords, etc? Yes  Adequate lighting in your home to reduce risk of falls? Yes   ASSISTIVE DEVICES  UTILIZED TO PREVENT FALLS:  Life alert? No  Use of a cane, walker or w/c? No  Grab bars in the bathroom? Yes  Shower chair or bench in shower? Yes  Elevated toilet seat or a handicapped toilet? Yes   Cognitive Function: Declined today.     6CIT Screen 01/19/2017  What Year? 0 points  What month? 0 points  What time? 0 points  Count back from 20 0 points  Months in reverse 2 points  Repeat phrase 2 points  Total Score 4    Immunizations Immunization History  Administered Date(s) Administered  . Fluad Quad(high Dose 65+) 06/28/2019  . Influenza Split 08/23/2012  . Influenza, High Dose Seasonal PF 09/09/2015, 07/14/2016, 07/26/2017, 09/06/2018  . Influenza,inj,Quad PF,6+ Mos 09/07/2013  . PFIZER SARS-COV-2 Vaccination 01/01/2020, 01/22/2020  . Pneumococcal Conjugate-13 01/01/2015  . Pneumococcal Polysaccharide-23 01/07/2016  . Tdap 07/29/2011  . Zoster 09/07/2013    TDAP status: Up to date Flu Vaccine status: Up to date Pneumococcal vaccine status: Up to date Covid-19 vaccine status: Completed vaccines  Qualifies for Shingles Vaccine? Yes   Zostavax completed Yes   Shingrix Completed?: No.    Education has been provided regarding the importance of this vaccine. Patient has been advised to call insurance company to determine out of pocket expense if they have not yet received this vaccine. Advised may also receive vaccine at local pharmacy or Health Dept. Verbalized acceptance and understanding.  Screening Tests Health Maintenance  Topic Date Due  . INFLUENZA VACCINE  06/09/2020  . TETANUS/TDAP  07/28/2021  . COLONOSCOPY  01/11/2030  . COVID-19 Vaccine  Completed  . Hepatitis C Screening  Completed  . PNA vac Low Risk Adult  Completed    Health Maintenance  Health Maintenance Due  Topic Date Due  . INFLUENZA VACCINE  06/09/2020    Colorectal cancer screening: Completed 01/12/20. Repeat every 10 years  Lung Cancer Screening: (Low Dose CT Chest recommended if  Age 11-80 years, 30 pack-year currently smoking OR have quit w/in 15years.) does not qualify.    Additional Screening:  Hepatitis C Screening: Up to date  Vision Screening: Recommended annual ophthalmology exams for early detection of glaucoma and other disorders of the eye. Is the patient up to date with their annual eye exam?  Yes  Who is the provider or what is the name of the office in which the patient attends annual eye exams? [facility] If pt is not established with a provider, would they like to be referred to a provider to establish care? No .   Dental Screening: Recommended annual dental exams for proper oral hygiene  Community Resource Referral / Chronic Care Management: CRR required this visit?  No   CCM required this visit?  No      Plan:     I have personally reviewed and noted the following in the patient's chart:   . Medical and social history . Use of alcohol, tobacco or illicit drugs  . Current medications and supplements . Functional ability and status . Nutritional status . Physical activity . Advanced directives . List of other physicians . Hospitalizations, surgeries, and ER visits in previous 12 months . Vitals . Screenings to include cognitive, depression, and falls . Referrals and appointments  In addition, I have reviewed and discussed with patient certain preventive protocols, quality metrics, and best practice recommendations. A written personalized care plan for preventive services as well as general preventive health recommendations were provided to patient.     Kursten Kruk [place], [place]   06/26/7115   Nurse Notes: Flu shot due this fall.

## 2020-06-26 ENCOUNTER — Other Ambulatory Visit: Payer: Self-pay

## 2020-06-26 ENCOUNTER — Ambulatory Visit (INDEPENDENT_AMBULATORY_CARE_PROVIDER_SITE_OTHER): Payer: Medicare Other

## 2020-06-26 DIAGNOSIS — Z0000 Encounter for general adult medical examination without abnormal findings: Secondary | ICD-10-CM

## 2020-06-26 NOTE — Patient Instructions (Signed)
Christian [name] , Thank you for taking time to come for your Medicare Wellness Visit. I appreciate your ongoing commitment to your health goals. Please review the following plan we discussed and let me know if I can assist you in the future.   Screening recommendations/referrals: Colonoscopy: Up to date, due 01/2030 Recommended yearly ophthalmology/optometry visit for glaucoma screening and checkup Recommended yearly dental visit for hygiene and checkup  Vaccinations: Influenza vaccine: Due fall 2021 Pneumococcal vaccine: Completed series Tdap vaccine: Up to date, due 07/2021 Shingles vaccine: Shingrix discussed. Please contact your pharmacy for coverage information.     Advanced directives: Please bring a copy of your POA (Power of Attorney) and/or Living Will to your next appointment.   Conditions/risks identified: Continue to work on eating a balanced diet with small portion sizes and 3 meals a day.  Next appointment: 09/12/20 @ 10:20 AM with Dr Sullivan Lone   Preventive Care 65 Years and Older, Male Preventive care refers to lifestyle choices and visits with your health care provider that can promote health and wellness. What does preventive care include?  A yearly physical exam. This is also called an annual well check.  Dental exams once or twice a year.  Routine eye exams. Ask your health care provider how often you should have your eyes checked.  Personal lifestyle choices, including:  Daily care of your teeth and gums.  Regular physical activity.  Eating a healthy diet.  Avoiding tobacco and drug use.  Limiting alcohol use.  Practicing safe sex.  Taking low doses of aspirin every day.  Taking vitamin and mineral supplements as recommended by your health care provider. What happens during an annual well check? The services and screenings done by your health care provider during your annual well check will depend on your age, overall health, lifestyle risk factors, and  family history of disease. Counseling  Your health care provider may ask you questions about your:  Alcohol use.  Tobacco use.  Drug use.  Emotional well-being.  Home and relationship well-being.  Sexual activity.  Eating habits.  History of falls.  Memory and ability to understand (cognition).  Work and work Astronomer. Screening  You may have the following tests or measurements:  Height, weight, and BMI.  Blood pressure.  Lipid and cholesterol levels. These may be checked every 5 years, or more frequently if you are over 61 years old.  Skin check.  Lung cancer screening. You may have this screening every year starting at age 35 if you have a 30-pack-year history of smoking and currently smoke or have quit within the past 15 years.  Fecal occult blood test (FOBT) of the stool. You may have this test every year starting at age 41.  Flexible sigmoidoscopy or colonoscopy. You may have a sigmoidoscopy every 5 years or a colonoscopy every 10 years starting at age 49.  Prostate cancer screening. Recommendations will vary depending on your family history and other risks.  Hepatitis C blood test.  Hepatitis B blood test.  Sexually transmitted disease (STD) testing.  Diabetes screening. This is done by checking your blood sugar (glucose) after you have not eaten for a while (fasting). You may have this done every 1-3 years.  Abdominal aortic aneurysm (AAA) screening. You may need this if you are a current or former smoker.  Osteoporosis. You may be screened starting at age 8 if you are at high risk. Talk with your health care provider about your test results, treatment options, and if necessary, the  need for more tests. Vaccines  Your health care provider may recommend certain vaccines, such as:  Influenza vaccine. This is recommended every year.  Tetanus, diphtheria, and acellular pertussis (Tdap, Td) vaccine. You may need a Td booster every 10 years.  Zoster  vaccine. You may need this after age 42.  Pneumococcal 13-valent conjugate (PCV13) vaccine. One dose is recommended after age 43.  Pneumococcal polysaccharide (PPSV23) vaccine. One dose is recommended after age 35. Talk to your health care provider about which screenings and vaccines you need and how often you need them. This information is not intended to replace advice given to you by your health care provider. Make sure you discuss any questions you have with your health care provider. Document Released: 11/22/2015 Document Revised: 07/15/2016 Document Reviewed: 08/27/2015 Elsevier Interactive Patient Education  2017 [employer] Prevention in the Home Falls can cause injuries. They can happen to people of all ages. There are many things you can do to make your home safe and to help prevent falls. What can I do on the outside of my home?  Regularly fix the edges of walkways and driveways and fix any cracks.  Remove anything that might make you trip as you walk through a door, such as a raised step or threshold.  Trim any bushes or trees on the path to your home.  Use bright outdoor lighting.  Clear any walking paths of anything that might make someone trip, such as rocks or tools.  Regularly check to see if handrails are loose or broken. Make sure that both sides of any steps have handrails.  Any raised decks and porches should have guardrails on the edges.  Have any leaves, snow, or ice cleared regularly.  Use sand or salt on walking paths during winter.  Clean up any spills in your garage right away. This includes oil or grease spills. What can I do in the bathroom?  Use night lights.  Install grab bars by the toilet and in the tub and shower. Do not use towel bars as grab bars.  Use non-skid mats or decals in the tub or shower.  If you need to sit down in the shower, use a plastic, non-slip stool.  Keep the floor dry. Clean up any water that spills on the  floor as soon as it happens.  Remove soap buildup in the tub or shower regularly.  Attach bath mats securely with double-sided non-slip rug tape.  Do not have throw rugs and other things on the floor that can make you trip. What can I do in the bedroom?  Use night lights.  Make sure that you have a light by your bed that is easy to reach.  Do not use any sheets or blankets that are too big for your bed. They should not hang down onto the floor.  Have a firm chair that has side arms. You can use this for support while you get dressed.  Do not have throw rugs and other things on the floor that can make you trip. What can I do in the kitchen?  Clean up any spills right away.  Avoid walking on wet floors.  Keep items that you use a lot in easy-to-reach places.  If you need to reach something above you, use a strong step stool that has a grab bar.  Keep electrical cords out of the way.  Do not use floor polish or wax that makes floors slippery. If you must use wax,  use non-skid floor wax.  Do not have throw rugs and other things on the floor that can make you trip. What can I do with my stairs?  Do not leave any items on the stairs.  Make sure that there are handrails on both sides of the stairs and use them. Fix handrails that are broken or loose. Make sure that handrails are as long as the stairways.  Check any carpeting to make sure that it is firmly attached to the stairs. Fix any carpet that is loose or worn.  Avoid having throw rugs at the top or bottom of the stairs. If you do have throw rugs, attach them to the floor with carpet tape.  Make sure that you have a light switch at the top of the stairs and the bottom of the stairs. If you do not have them, ask someone to add them for you. What else can I do to help prevent falls?  Wear shoes that:  Do not have high heels.  Have rubber bottoms.  Are comfortable and fit you well.  Are closed at the toe. Do not wear  sandals.  If you use a stepladder:  Make sure that it is fully opened. Do not climb a closed stepladder.  Make sure that both sides of the stepladder are locked into place.  Ask someone to hold it for you, if possible.  Clearly mark and make sure that you can see:  Any grab bars or handrails.  First and last steps.  Where the edge of each step is.  Use tools that help you move around (mobility aids) if they are needed. These include:  Canes.  Walkers.  Scooters.  Crutches.  Turn on the lights when you go into a dark area. Replace any light bulbs as soon as they burn out.  Set up your furniture so you have a clear path. Avoid moving your furniture around.  If any of your floors are uneven, fix them.  If there are any pets around you, be aware of where they are.  Review your medicines with your doctor. Some medicines can make you feel dizzy. This can increase your chance of falling. Ask your doctor what other things that you can do to help prevent falls. This information is not intended to replace advice given to you by your health care provider. Make sure you discuss any questions you have with your health care provider. Document Released: 08/22/2009 Document Revised: 04/02/2016 Document Reviewed: 11/30/2014 Elsevier Interactive Patient Education  2017 [employer].

## 2020-07-04 NOTE — Telephone Encounter (Signed)
If you see something earlier for him I am happy to do that.

## 2020-07-23 ENCOUNTER — Encounter: Payer: Medicare Other | Admitting: Family Medicine

## 2020-09-12 ENCOUNTER — Other Ambulatory Visit: Payer: Self-pay

## 2020-09-12 ENCOUNTER — Encounter: Payer: Self-pay | Admitting: Family Medicine

## 2020-09-12 ENCOUNTER — Encounter: Payer: Medicare Other | Admitting: Family Medicine

## 2020-09-12 ENCOUNTER — Ambulatory Visit (INDEPENDENT_AMBULATORY_CARE_PROVIDER_SITE_OTHER): Payer: Medicare Other | Admitting: Family Medicine

## 2020-09-12 VITALS — BP 147/69 | HR 57 | Temp 98.6°F | Resp 16 | Ht 69.0 in | Wt 236.0 lb

## 2020-09-12 DIAGNOSIS — E782 Mixed hyperlipidemia: Secondary | ICD-10-CM

## 2020-09-12 DIAGNOSIS — E119 Type 2 diabetes mellitus without complications: Secondary | ICD-10-CM | POA: Diagnosis not present

## 2020-09-12 DIAGNOSIS — R053 Chronic cough: Secondary | ICD-10-CM

## 2020-09-12 DIAGNOSIS — Z0000 Encounter for general adult medical examination without abnormal findings: Secondary | ICD-10-CM | POA: Diagnosis not present

## 2020-09-12 DIAGNOSIS — I10 Essential (primary) hypertension: Secondary | ICD-10-CM

## 2020-09-12 DIAGNOSIS — Z23 Encounter for immunization: Secondary | ICD-10-CM | POA: Diagnosis not present

## 2020-09-12 MED ORDER — ALBUTEROL SULFATE HFA 108 (90 BASE) MCG/ACT IN AERS: 2.0000 | INHALATION_SPRAY | RESPIRATORY_TRACT | 2 refills | Status: DC | PRN

## 2020-09-12 NOTE — Progress Notes (Signed)
I,[name],acting as a scribe for Megan Mansichard Nakai Yard Jr, MD.,have documented all relevant documentation on the behalf of Megan MansRichard Lacinda Curvin Jr, MD,as directed by  Megan Mansichard Treva Huyett Jr, MD while in the presence of Megan Mansichard Mande Auvil Jr, MD.   Complete physical exam   Patient: Christian [name]   DOB: 08/10/1951   69 y.o. Male  MRN: [id number] Visit Date: 09/12/2020  Today's healthcare provider: Megan Mansichard Shirlene Andaya Jr, MD   Chief Complaint  Patient presents with  . Annual Exam   Subjective    Christian [name] is a 69 y.o. male who presents today for a complete physical exam.  He reports consuming a general diet. Home exercise routine includes walking. He generally feels fairly well. He reports sleeping well. He does not have additional problems to discuss today. Chronic Cough HPI  Patient has developed a cough in recent months when he is supine.  No PND or orthopnea.  No chest pain or dyspnea on exertion . Patient had AWV with [facility] on 06/26/2020.  Past Medical History:  Diagnosis Date  . Anxiety   . Bradycardia   . Hyperlipidemia   . Hypertension   . Prediabetes    Past Surgical History:  Procedure Laterality Date  . COLONOSCOPY WITH PROPOFOL N/A 01/12/2020   Procedure: COLONOSCOPY WITH PROPOFOL;  Surgeon: Midge MiniumWohl, Darren, MD;  [facility] END[facility]COPY;  Service: Endoscopy;  Laterality: N/A;  . INGUINAL HERNIA REPAIR Left   . vasectomy  [id number]   Social History   Socioeconomic History  . Marital status: Married    Spouse name: Not on file  . Number of children: 3  . Years of education: Not on file  . Highest education level: 12th grade  Occupational History  . 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 Smoking status: Never Smoker  . Smokeless tobacco: Current User    Types: Snuff  . Tobacco comment: uses smokeless tobacco "dip"  Vaping Use  . Vaping Use: Never used  Substance and Sexual Activity  . Alcohol use: Yes    Alcohol/week:  15.0 standard drinks    Types: 15 Cans of beer per week    Comment: 3/day x 5  . Drug use: No  . Sexual activity: Not on file  Other Topics Concern  . Not on file  Social History Narrative  . Not on file   Social Determinants of Health   Financial Resource Strain: Low Risk   . Difficulty of Paying Living Expenses: Not hard at all  Food Insecurity: No Food Insecurity  . Worried About Programme researcher, broadcasting/film/videounning Out of Food in the Last Year: Never true  . Ran Out of Food in the Last Year: Never true  Transportation Needs: No Transportation Needs  . Lack of Transportation (Medical): No  . Lack of Transportation (Non-Medical): No  Physical Activity: Inactive  . Days of Exercise per Week: 0 days  . Minutes of Exercise per Session: 0 min  Stress: No Stress Concern Present  . Feeling of Stress : Not at all  Social Connections: Moderately Integrated  . Frequency of Communication with Friends and Family: More than three times a week  . Frequency of Social Gatherings with Friends and Family: More than three times a week  . Attends Religious Services: More than 4 times per year  . Active Member of Clubs or Organizations: No  . Attends BankerClub or Organization Meetings: Never  . Marital Status: Married  Catering managerntimate Partner Violence: Not  At Risk  . Fear of Current or Ex-Partner: No  . Emotionally Abused: No  . Physically Abused: No  . Sexually Abused: No   Family Status  Relation Name Status  . Mother  Alive  . Father  Deceased at age 31  . Sister  Alive  . Daughter  Alive  . Son  Alive  . Sister  Alive  . Daughter  Alive   Family History  Problem Relation Age of Onset  . Dementia Mother   . Colon cancer Father   . Hypertension Sister   . Hypertension Son    No Known Allergies  Patient Care Team: Maple Hudson., MD as PCP - General (Family Medicine) Lemar Livings, Merrily Pew, MD as Consulting Physician (General Surgery) [facility], [facility]) Midge Minium, MD as Consulting Physician  (Gastroenterology)   Medications: Outpatient Medications Prior to Visit  Medication Sig  . aspirin 81 MG tablet Take 81 mg by mouth daily.   Marland Kitchen atorvastatin (LIPITOR) 40 MG tablet Take 1 tablet (40 mg total) by mouth at bedtime.  Marland Kitchen BREO ELLIPTA 100-25 MCG/INH AEPB INHALE 1 PUFF INTO THE LUNGS DAILY  . cholecalciferol (VITAMIN D) 1000 UNITS tablet Take 1,000 Units by mouth daily.   Marland Kitchen glucosamine-chondroitin 500-400 MG tablet Take 1 tablet by mouth daily.   Marland Kitchen glucose blood (CONTOUR NEXT TEST) test strip 1 each by Other route daily. Use as instructed  . metFORMIN (GLUCOPHAGE) 500 MG tablet Take 1 tablet (500 mg total) by mouth daily with breakfast.  . MICROLET LANCETS MISC 1 each by Does not apply route daily.  . Olmesartan-amLODIPine-HCTZ 40-10-25 MG TABS Take 1 tablet by mouth daily.  Marland Kitchen ALPRAZolam (XANAX) 0.5 MG tablet Take 1 tablet (0.5 mg total) by mouth 2 (two) times daily as needed. (Patient not taking: Reported on 06/26/2020)   No facility-administered medications prior to visit.    Review of Systems  Respiratory: Positive for cough.   All other systems reviewed and are negative.      Objective    BP (!) 147/69 (BP Location: Right Arm, Patient Position: Sitting, Cuff Size: Large)   Pulse (!) 57   Temp 98.6 F (37 C) (Oral)   Resp 16   Ht 5\' 9"  (1.753 m)   Wt 236 lb (107 kg)   SpO2 98%   BMI 34.85 kg/m  BP Readings from Last 3 Encounters:  09/12/20 (!) 147/69  01/12/20 (!) 141/69  12/07/19 116/65   Wt Readings from Last 3 Encounters:  09/12/20 236 lb (107 kg)  01/12/20 221 lb (100.2 kg)  12/07/19 235 lb 3.2 oz (106.7 kg)      Physical Exam Vitals reviewed.  Constitutional:      Appearance: He is well-developed. He is obese.  HENT:     Head: Normocephalic and atraumatic.     Right Ear: External ear normal.     Left Ear: External ear normal.     Nose: Nose normal.  Eyes:     General: No scleral icterus.    Conjunctiva/sclera: Conjunctivae normal.      Pupils: Pupils are equal, round, and reactive to light.  Cardiovascular:     Rate and Rhythm: Normal rate and regular rhythm.     Heart sounds: Normal heart sounds.  Pulmonary:     Effort: Pulmonary effort is normal.     Breath sounds: Normal breath sounds. No wheezing.  Abdominal:     General: Bowel sounds are normal.     Palpations:  Abdomen is soft.  Genitourinary:    Penis: Normal.      Testes: Normal.     Prostate: Normal.     Rectum: Normal.  Musculoskeletal:     Cervical back: Normal range of motion and neck supple.  Skin:    General: Skin is warm and dry.  Neurological:     General: No focal deficit present.     Mental Status: He is alert and oriented to person, place, and time.  Psychiatric:        Mood and Affect: Mood normal.        Behavior: Behavior normal.        Thought Content: Thought content normal.        Judgment: Judgment normal.       Last depression screening scores PHQ 2/9 Scores 06/26/2020 05/18/2019 01/25/2018  PHQ - 2 Score 0 0 0  PHQ- 9 Score - - 0   Last fall risk screening Fall Risk  06/26/2020  Falls in the past year? 0  Number falls in past yr: 0  Injury with Fall? 0   Last Audit-C alcohol use screening Alcohol Use Disorder Test (AUDIT) 06/26/2020  1. How often do you have a drink containing alcohol? 3  2. How many drinks containing alcohol do you have on a typical day when you are drinking? 1  3. How often do you have six or more drinks on one occasion? 0  AUDIT-C Score 4  4. How often during the last year have you found that you were not able to stop drinking once you had started? 0  5. How often during the last year have you failed to do what was normally expected from you because of drinking? 0  6. How often during the last year have you needed a first drink in the morning to get yourself going after a heavy drinking session? 0  7. How often during the last year have you had a feeling of guilt of remorse after drinking? 0  8. How often  during the last year have you been unable to remember what happened the night before because you had been drinking? 0  9. Have you or someone else been injured as a result of your drinking? 0  10. Has a relative or friend or a doctor or another health worker been concerned about your drinking or suggested you cut down? 0  Alcohol Use Disorder Identification Test Final Score (AUDIT) 4  Alcohol Brief Interventions/Follow-up AUDIT Score <7 follow-up not indicated   A score of 3 or more in women, and 4 or more in men indicates increased risk for alcohol abuse, EXCEPT if all of the points are from question 1   No results found for any visits on 09/12/20.  Assessment & Plan    Routine Health Maintenance and Physical Exam  Exercise Activities and Dietary recommendations Goals    . DIET - REDUCE PORTION SIZE     Recommend decreasing portion sizes by half and eating 3 small meals a day with 2 healthy snacks in between.        Immunization History  Administered Date(s) Administered  . Fluad Quad(high Dose 65+) 06/28/2019, 09/12/2020  . Influenza Split 08/23/2012  . Influenza, High Dose Seasonal PF 09/09/2015, 07/14/2016, 07/26/2017, 09/06/2018  . Influenza,inj,Quad PF,6+ Mos 09/07/2013  . PFIZER SARS-COV-2 Vaccination 01/01/2020, 01/22/2020, 09/12/2020  . Pneumococcal Conjugate-13 01/01/2015  . Pneumococcal Polysaccharide-23 01/07/2016  . Tdap 07/29/2011  . Zoster 09/07/2013    Health Maintenance  Topic Date Due  . TETANUS/TDAP  07/28/2021  . COLONOSCOPY  01/11/2030  . INFLUENZA VACCINE  Completed  . COVID-19 Vaccine  Completed  . Hepatitis C Screening  Completed  . PNA vac Low Risk Adult  Completed    Discussed health benefits of physical activity, and encouraged him to engage in regular exercise appropriate for his age and condition.  1. Annual physical exam  - CBC w/Diff/Platelet - Comprehensive Metabolic Panel (CMET) - TSH - Lipid panel - Hemoglobin A1c  2. Essential  hypertension Home blood pressures are 120s over 70 area - CBC w/Diff/Platelet - Comprehensive Metabolic Panel (CMET) - TSH - Lipid panel - Hemoglobin A1c  3. Mixed hyperlipidemia  - CBC w/Diff/Platelet - Comprehensive Metabolic Panel (CMET) - TSH - Lipid panel - Hemoglobin A1c  4. Diabetes mellitus without complication (HCC) Obtain A1c - CBC w/Diff/Platelet - Comprehensive Metabolic Panel (CMET) - TSH - Lipid panel - Hemoglobin A1c  5. Encounter for immunization  - [employer] Vaccine  6. Need for immunization against influenza  - Flu Vaccine QUAD High Dose(Fluad)  7. Chronic cough May be reflux or allergies..  I do not think this is CHF.  May need pulmonary work-up. - albuterol (VENTOLIN HFA) 108 (90 Base) MCG/ACT inhaler; Inhale 2 puffs into the lungs every 4 (four) hours as needed for wheezing or shortness of breath.  Dispense: 18 g; Refill: 2   Return in about 1 month (around 10/12/2020).        Saron Tweed Wendelyn Breslow, MD  [facility] [phone] (phone) [phone] (fax)  [facility]

## 2020-09-13 ENCOUNTER — Encounter: Payer: Self-pay | Admitting: Family Medicine

## 2020-09-13 LAB — COMPREHENSIVE METABOLIC PANEL
ALT: 65 IU/L — ABNORMAL HIGH (ref 0–44)
AST: 37 IU/L (ref 0–40)
Albumin/Globulin Ratio: 2.4 — ABNORMAL HIGH (ref 1.2–2.2)
Albumin: 5 g/dL — ABNORMAL HIGH (ref 3.7–4.7)
Alkaline Phosphatase: 88 IU/L (ref 44–121)
BUN/Creatinine Ratio: 15 (ref 10–24)
BUN: 16 mg/dL (ref 8–27)
Bilirubin Total: 0.8 mg/dL (ref 0.0–1.2)
CO2: 24 mmol/L (ref 20–29)
Calcium: 9.9 mg/dL (ref 8.6–10.2)
Chloride: 101 mmol/L (ref 96–106)
Creatinine, Ser: 1.06 mg/dL (ref 0.76–1.27)
GFR calc Af Amer: 81 mL/min/{1.73_m2} (ref >59)
GFR calc non Af Amer: 70 mL/min/{1.73_m2} (ref >59)
Globulin, Total: 2.1 g/dL (ref 1.5–4.5)
Glucose: 130 mg/dL — ABNORMAL HIGH (ref 65–99)
Potassium: 4.2 mmol/L (ref 3.5–5.2)
Sodium: 140 mmol/L (ref 134–144)
Total Protein: 7.1 g/dL (ref 6.0–8.5)

## 2020-09-13 LAB — CBC WITH DIFFERENTIAL/PLATELET
Basophils Absolute: 0 10*3/uL (ref 0.0–0.2)
Basos: 1 %
EOS (ABSOLUTE): 0.2 10*3/uL (ref 0.0–0.4)
Eos: 3 %
Hematocrit: 45.3 % (ref 37.5–51.0)
Hemoglobin: 15.7 g/dL (ref 13.0–17.7)
Immature Grans (Abs): 0 10*3/uL (ref 0.0–0.1)
Immature Granulocytes: 0 %
Lymphocytes Absolute: 1.6 10*3/uL (ref 0.7–3.1)
Lymphs: 26 %
MCH: 32.6 pg (ref 26.6–33.0)
MCHC: 34.7 g/dL (ref 31.5–35.7)
MCV: 94 fL (ref 79–97)
Monocytes Absolute: 0.5 10*3/uL (ref 0.1–0.9)
Monocytes: 8 %
Neutrophils Absolute: 3.9 10*3/uL (ref 1.4–7.0)
Neutrophils: 62 %
Platelets: 229 10*3/uL (ref 150–450)
RBC: 4.81 x10E6/uL (ref 4.14–5.80)
RDW: 12.9 % (ref 11.6–15.4)
WBC: 6.3 10*3/uL (ref 3.4–10.8)

## 2020-09-13 LAB — LIPID PANEL
Chol/HDL Ratio: 3.4 ratio (ref 0.0–5.0)
Cholesterol, Total: 135 mg/dL (ref 100–199)
HDL: 40 mg/dL (ref >39)
LDL Chol Calc (NIH): 73 mg/dL (ref 0–99)
Triglycerides: 121 mg/dL (ref 0–149)
VLDL Cholesterol Cal: 22 mg/dL (ref 5–40)

## 2020-09-13 LAB — HEMOGLOBIN A1C
Est. average glucose Bld gHb Est-mCnc: 134 mg/dL
Hgb A1c MFr Bld: 6.3 % — ABNORMAL HIGH (ref 4.8–5.6)

## 2020-09-13 LAB — TSH: TSH: 1.25 u[IU]/mL (ref 0.450–4.500)

## 2020-09-18 ENCOUNTER — Telehealth: Payer: Self-pay | Admitting: *Deleted

## 2020-09-18 NOTE — Chronic Care Management (AMB) (Signed)
  Chronic Care Management   Note  09/18/2020 Name: Christian [name] MRN: [id number] DOB: [birth year]  Christian [name] is a 69 y.o. year old male who is a primary care patient of Jerrol Banana., MD. I reached out to Christian [name] by phone today in response to a referral sent by [name]'s health plan.     [name] was given information about Chronic Care Management services today including:  1. CCM service includes personalized support from designated clinical staff supervised by his physician, including individualized plan of care and coordination with other care providers 2. 24/7 contact phone numbers for assistance for urgent and routine care needs. 3. Service will only be billed when office clinical staff spend 20 minutes or more in a month to coordinate care. 4. Only one practitioner may furnish and bill the service in a calendar month. 5. The patient may stop CCM services at any time (effective at the end of the month) by phone call to the office staff. 6. The patient will be responsible for cost sharing (co-pay) of up to 20% of the service fee (after annual deductible is met).  Patient wishes to consider information provided and/or speak with a member of the care team before deciding about enrollment in care management services.   Follow up plan:  The patient has been provided with contact information for the care management team and has been advised to call with any health related questions or concerns. If patient returns call to provider office, please advise to call [place] at [phone].  [employer]  Direct Dial: [phone]

## 2020-10-08 ENCOUNTER — Other Ambulatory Visit: Payer: Self-pay | Admitting: Family Medicine

## 2020-10-08 DIAGNOSIS — J4520 Mild intermittent asthma, uncomplicated: Secondary | ICD-10-CM

## 2020-10-16 ENCOUNTER — Other Ambulatory Visit: Payer: Self-pay | Admitting: Family Medicine

## 2020-10-16 NOTE — Progress Notes (Signed)
Established patient visit   Patient: Christian [name]   DOB: 1951-03-23   69 y.o. Male  MRN: [id number] Visit Date: 10/17/2020  Today's healthcare provider: Megan Mans, MD   Chief Complaint  Patient presents with  . Cough    One month follow up on Chronic Cough.    Subjective    HPI HPI    Cough     Additional comments: One month follow up on Chronic Cough.        Last edited by Paschal Dopp, CMA on 10/17/2020  8:31 AM. (History)      Chronic cough is better.  He finished Breo did not notice any difference but albuterol seems to help. No other symptoms.  No orthopnea or PND.  No hemoptysis.  No weight loss.  No shortness of breath. Chronic cough From 09/12/2020-May be reflux or allergies..  I do not think this is CHF.  May need pulmonary work-up. Given rx for albuterol inhaler.  Follow up for Chronic Cough  The patient was last seen for this 4 weeks ago. Changes made at last visit includes adding albuterol inhaler.  He reports excellent compliance with treatment. He feels that condition is Improved. He is not having side effects.   -----------------------------------------------------------------------------------------    Patient Active Problem List   Diagnosis Date Noted  . RAD (reactive airway disease) 12/24/2018  . Adiposity 11/26/2018  . Hypercalcemia 07/01/2015  . Alcohol drinker 05/08/2015  . Anxiety 05/08/2015  . Depression, major, single episode, mild (HCC) 05/08/2015  . Acid reflux 05/08/2015  . Difficulty hearing 05/08/2015  . Blood glucose elevated 05/08/2015  . HLD (hyperlipidemia) 05/08/2015  . BP (high blood pressure) 05/08/2015  . Cannot sleep 05/08/2015  . Osteoarthrosis 05/08/2015  . Avitaminosis D 05/08/2015   Past Medical History:  Diagnosis Date  . Anxiety   . Bradycardia   . Hyperlipidemia   . Hypertension   . Prediabetes    Social History   Tobacco Use  . Smoking status: Never Smoker  . Smokeless  tobacco: Current User    Types: Snuff  . Tobacco comment: uses smokeless tobacco "dip"  Vaping Use  . Vaping Use: Never used  Substance Use Topics  . Alcohol use: Yes    Alcohol/week: 15.0 standard drinks    Types: 15 Cans of beer per week    Comment: 3/day x 5  . Drug use: No   No Known Allergies   Medications: Outpatient Medications Prior to Visit  Medication Sig  . albuterol (VENTOLIN HFA) 108 (90 Base) MCG/ACT inhaler Inhale 2 puffs into the lungs every 4 (four) hours as needed for wheezing or shortness of breath.  . ALPRAZolam (XANAX) 0.5 MG tablet Take 1 tablet (0.5 mg total) by mouth 2 (two) times daily as needed.  Marland Kitchen aspirin 81 MG tablet Take 81 mg by mouth daily.   Marland Kitchen atorvastatin (LIPITOR) 40 MG tablet Take 1 tablet (40 mg total) by mouth at bedtime.  Marland Kitchen BREO ELLIPTA 100-25 MCG/INH AEPB INHALE 1 PUFF INTO THE LUNGS DAILY  . cholecalciferol (VITAMIN D) 1000 UNITS tablet Take 1,000 Units by mouth daily.   Marland Kitchen glucosamine-chondroitin 500-400 MG tablet Take 1 tablet by mouth daily.   Marland Kitchen glucose blood (CONTOUR NEXT TEST) test strip 1 each by Other route daily. Use as instructed  . metFORMIN (GLUCOPHAGE) 500 MG tablet TAKE 1 TABLET(500 MG) BY MOUTH DAILY WITH BREAKFAST  . MICROLET LANCETS MISC 1 each by Does not apply route daily.  Marland Kitchen  Olmesartan-amLODIPine-HCTZ 40-10-25 MG TABS Take 1 tablet by mouth daily.   No facility-administered medications prior to visit.    Review of Systems  Constitutional: Negative.  Negative for appetite change, chills and fever.  Respiratory: Positive for cough. Negative for chest tightness, shortness of breath and wheezing.   Cardiovascular: Negative.  Negative for chest pain and palpitations.  Gastrointestinal: Negative for abdominal pain, nausea and vomiting.  Neurological: Negative for dizziness, light-headedness and headaches.      Objective    BP (!) 141/70 (BP Location: Right Arm, Patient Position: Sitting, Cuff Size: Large)   Pulse (!) 53    Temp 98.1 F (36.7 C) (Oral)   Wt 238 lb (108 kg)   SpO2 98%   BMI 35.15 kg/m    Physical Exam Vitals reviewed.  Constitutional:      Appearance: He is well-developed.  HENT:     Head: Normocephalic and atraumatic.     Right Ear: External ear normal.     Left Ear: External ear normal.     Nose: Nose normal.  Eyes:     Conjunctiva/sclera: Conjunctivae normal.     Pupils: Pupils are equal, round, and reactive to light.  Cardiovascular:     Rate and Rhythm: Normal rate and regular rhythm.     Heart sounds: Normal heart sounds.  Pulmonary:     Effort: Pulmonary effort is normal.     Breath sounds: Normal breath sounds.  Abdominal:     General: Bowel sounds are normal.     Palpations: Abdomen is soft.  Genitourinary:    Penis: Normal.      Prostate: Normal.     Rectum: Normal.  Musculoskeletal:        General: Normal range of motion.     Cervical back: Normal range of motion and neck supple.  Skin:    General: Skin is warm and dry.  Neurological:     General: No focal deficit present.     Mental Status: He is alert and oriented to person, place, and time. Mental status is at baseline.  Psychiatric:        Mood and Affect: Mood normal.        Behavior: Behavior normal.        Thought Content: Thought content normal.        Judgment: Judgment normal.       No results found for any visits on 10/17/20.  Assessment & Plan     1. Chronic cough Obtain chest x-ray.  Consider pulmonary referral. - DG Chest 2 View  2. Primary hypertension Good control.  Patient on olmesartan/amlodipine/HCT  3. Mild intermittent reactive airway disease without complication Again, consider pulmonary referral  4. Class 1 obesity due to excess calories with serious comorbidity and body mass index (BMI) of 34.0 to 34.9 in adult   5. Gastroesophageal reflux disease without esophagitis Presently no medications needed   No follow-ups on file.         Dejanee Thibeaux Wendelyn Breslow, MD   [facility] [phone] (phone) [phone] (fax)  [facility]

## 2020-10-17 ENCOUNTER — Ambulatory Visit
Admission: RE | Admit: 2020-10-17 | Discharge: 2020-10-17 | Disposition: A | Discharge status: Home / Self Care | Payer: Medicare Other | Source: Ambulatory Visit | Attending: Family Medicine | Admitting: Family Medicine

## 2020-10-17 ENCOUNTER — Ambulatory Visit (INDEPENDENT_AMBULATORY_CARE_PROVIDER_SITE_OTHER): Payer: Medicare Other | Admitting: Family Medicine

## 2020-10-17 ENCOUNTER — Other Ambulatory Visit: Payer: Self-pay

## 2020-10-17 ENCOUNTER — Ambulatory Visit
Admission: RE | Admit: 2020-10-17 | Discharge: 2020-10-17 | Disposition: A | Discharge status: Home / Self Care | Payer: Medicare Other | Attending: Family Medicine | Admitting: Family Medicine

## 2020-10-17 ENCOUNTER — Encounter: Payer: Self-pay | Admitting: Family Medicine

## 2020-10-17 VITALS — BP 141/70 | HR 53 | Temp 98.1°F | Wt 238.0 lb

## 2020-10-17 DIAGNOSIS — R053 Chronic cough: Secondary | ICD-10-CM | POA: Diagnosis not present

## 2020-10-17 DIAGNOSIS — J4520 Mild intermittent asthma, uncomplicated: Secondary | ICD-10-CM

## 2020-10-17 DIAGNOSIS — Z6834 Body mass index (BMI) 34.0-34.9, adult: Secondary | ICD-10-CM

## 2020-10-17 DIAGNOSIS — I10 Essential (primary) hypertension: Secondary | ICD-10-CM

## 2020-10-17 DIAGNOSIS — K219 Gastro-esophageal reflux disease without esophagitis: Secondary | ICD-10-CM | POA: Diagnosis not present

## 2020-10-17 DIAGNOSIS — E6609 Other obesity due to excess calories: Secondary | ICD-10-CM | POA: Diagnosis not present

## 2020-10-20 ENCOUNTER — Encounter: Payer: Self-pay | Admitting: Family Medicine

## 2020-10-22 ENCOUNTER — Other Ambulatory Visit: Payer: Self-pay

## 2020-10-22 DIAGNOSIS — R053 Chronic cough: Secondary | ICD-10-CM

## 2020-10-22 MED ORDER — ALBUTEROL SULFATE HFA 108 (90 BASE) MCG/ACT IN AERS: 2.0000 | INHALATION_SPRAY | RESPIRATORY_TRACT | 2 refills | Status: DC | PRN

## 2020-11-05 ENCOUNTER — Ambulatory Visit: Payer: Self-pay

## 2020-11-05 NOTE — Telephone Encounter (Signed)
Pt. Is out of town and 1 episode of bright red blood with stool. Blood in toilet water and when wiping. No pain, has "gas." Had been fishing today and held BM until he got home. Appointment made. Instructed to go to nearest UC/ED for worsening of symptoms.  Reason for Disposition . MILD rectal bleeding (more than just a few drops or streaks)  Answer Assessment - Initial Assessment Questions 1. APPEARANCE of BLOOD: "What color is it?" "Is it passed separately, on the surface of the stool, or mixed in with the stool?"      Bright red 2. AMOUNT: "How much blood was passed?"      Large 3. FREQUENCY: "How many times has blood been passed with the stools?"      1 x 4. ONSET: "When was the blood first seen in the stools?" (Days or weeks)      Today 5. DIARRHEA: "Is there also some diarrhea?" If Yes, ask: "How many diarrhea stools were passed in past 24 hours?"      Diarrhe 6. CONSTIPATION: "Do you have constipation?" If Yes, ask: "How bad is it?"     No 7. RECURRENT SYMPTOMS: "Have you had blood in your stools before?" If Yes, ask: "When was the last time?" and "What happened that time?"      No 8. BLOOD THINNERS: "Do you take any blood thinners?" (e.g., Coumadin/warfarin, Pradaxa/dabigatran, aspirin)     ASA 9. OTHER SYMPTOMS: "Do you have any other symptoms?"  (e.g., abdominal pain, vomiting, dizziness, fever)     Gas issues 10. PREGNANCY: "Is there any chance you are pregnant?" "When was your last menstrual period?"       n/a  Protocols used: RECTAL BLEEDING-A-AH

## 2020-11-07 ENCOUNTER — Encounter: Payer: Self-pay | Admitting: Family Medicine

## 2020-11-07 ENCOUNTER — Other Ambulatory Visit: Payer: Self-pay

## 2020-11-07 ENCOUNTER — Ambulatory Visit (INDEPENDENT_AMBULATORY_CARE_PROVIDER_SITE_OTHER): Payer: Medicare Other | Admitting: Family Medicine

## 2020-11-07 VITALS — BP 128/71 | HR 51 | Temp 98.4°F | Ht 70.0 in | Wt 236.4 lb

## 2020-11-07 DIAGNOSIS — K648 Other hemorrhoids: Secondary | ICD-10-CM | POA: Diagnosis not present

## 2020-11-07 MED ORDER — HYDROCORTISONE (PERIANAL) 2.5 % EX CREA: 1.0000 "application " | TOPICAL_CREAM | Freq: Two times a day (BID) | CUTANEOUS | 0 refills | Status: DC

## 2020-11-07 NOTE — Patient Instructions (Signed)

## 2020-11-07 NOTE — Progress Notes (Signed)
Acute Office Visit  Subjective:    Patient ID: Christian [name], male    DOB: June 06, 1951, 69 y.o.   MRN: [id number]  No chief complaint on file.   HPI Pt reports blood in stool that began 11/05/20. Pt reports blood in stool is bright red. Pt also c/o of soreness in abdomen. Pt has has been lifting heavy that past few days (11/02/20 and 11/03/20).    Past Medical History:  Diagnosis Date   Anxiety    Bradycardia    Hyperlipidemia    Hypertension    Prediabetes     Past Surgical History:  Procedure Laterality Date   COLONOSCOPY WITH PROPOFOL N/A 01/12/2020   Procedure: COLONOSCOPY WITH PROPOFOL;  Surgeon: Midge Minium, MD;  Location: [facility] ENDOSCOPY;  Service: Endoscopy;  Laterality: N/A;   INGUINAL HERNIA REPAIR Left    vasectomy  1978    Family History  Problem Relation Age of Onset   Dementia Mother    Colon cancer Father    Hypertension Sister    Hypertension Son     Social History   Socioeconomic History   Marital status: Married    Spouse name: Not on file   Number of children: 3   Years of education: Not on file   Highest education level: 12th grade  Occupational History   Occupation: 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  Tobacco Use   Smoking status: Never Smoker   Smokeless tobacco: Current User    Types: Snuff   Tobacco comment: uses smokeless tobacco "dip"  Vaping Use   Vaping Use: Never used  Substance and Sexual Activity   Alcohol use: Yes    Alcohol/week: 15.0 standard drinks    Types: 15 Cans of beer per week    Comment: 3/day x 5   Drug use: No   Sexual activity: Not on file  Other Topics Concern   Not on file  Social History Narrative   Not on file   Social Determinants of Health   Financial Resource Strain: Low Risk    Difficulty of Paying Living Expenses: Not hard at all  Food Insecurity: No Food Insecurity   Worried About Programme researcher, broadcasting/film/video in the Last Year: Never true   Ran Out of Food in the Last Year:  Never true  Transportation Needs: No Transportation Needs   Lack of Transportation (Medical): No   Lack of Transportation (Non-Medical): No  Physical Activity: Inactive   Days of Exercise per Week: 0 days   Minutes of Exercise per Session: 0 min  Stress: No Stress Concern Present   Feeling of Stress : Not at all  Social Connections: Moderately Integrated   Frequency of Communication with Friends and Family: More than three times a week   Frequency of Social Gatherings with Friends and Family: More than three times a week   Attends Religious Services: More than 4 times per year   Active Member of [employer] or Organizations: No   Attends Banker Meetings: Never   Marital Status: Married  Catering manager Violence: Not At Risk   Fear of Current or Ex-Partner: No   Emotionally Abused: No   Physically Abused: No   Sexually Abused: No    Outpatient Medications Prior to Visit  Medication Sig Dispense Refill   albuterol (VENTOLIN HFA) 108 (90 Base) MCG/ACT inhaler Inhale 2 puffs into the lungs every 4 (four) hours as needed for wheezing or shortness of breath. 18 g  2   ALPRAZolam (XANAX) 0.5 MG tablet Take 1 tablet (0.5 mg total) by mouth 2 (two) times daily as needed. 60 tablet 5   aspirin 81 MG tablet Take 81 mg by mouth daily.      atorvastatin (LIPITOR) 40 MG tablet Take 1 tablet (40 mg total) by mouth at bedtime. 90 tablet 3   BREO ELLIPTA 100-25 MCG/INH AEPB INHALE 1 PUFF INTO THE LUNGS DAILY 60 each 2   cholecalciferol (VITAMIN D) 1000 UNITS tablet Take 1,000 Units by mouth daily.      glucosamine-chondroitin 500-400 MG tablet Take 1 tablet by mouth daily.      glucose blood (CONTOUR NEXT TEST) test strip 1 each by Other route daily. Use as instructed 100 each 12   metFORMIN (GLUCOPHAGE) 500 MG tablet TAKE 1 TABLET(500 MG) BY MOUTH DAILY WITH BREAKFAST 90 tablet 1   MICROLET LANCETS MISC 1 each by Does not apply route daily. 100 each 3   Olmesartan-amLODIPine-HCTZ 40-10-25  MG TABS Take 1 tablet by mouth daily. 90 tablet 3   No facility-administered medications prior to visit.    No Known Allergies  Review of Systems  Gastrointestinal: Positive for abdominal pain and blood in stool. Negative for constipation, diarrhea, nausea and vomiting.       Pt has abdominal pain around the obliques and below belly button  Genitourinary: Negative for difficulty urinating and dysuria.       Objective:    Physical Exam  There were no vitals taken for this visit. Wt Readings from Last 3 Encounters:  10/17/20 238 lb (108 kg)  09/12/20 236 lb (107 kg)  01/12/20 221 lb (100.2 kg)    There are no preventive care reminders to display for this patient.  There are no preventive care reminders to display for this patient.   Lab Results  Component Value Date   TSH 1.250 09/12/2020   Lab Results  Component Value Date   WBC 6.3 09/12/2020   HGB 15.7 09/12/2020   HCT 45.3 09/12/2020   MCV 94 09/12/2020   PLT 229 09/12/2020   Lab Results  Component Value Date   NA 140 09/12/2020   K 4.2 09/12/2020   CO2 24 09/12/2020   GLUCOSE 130 (H) 09/12/2020   BUN 16 09/12/2020   CREATININE 1.06 09/12/2020   BILITOT 0.8 09/12/2020   ALKPHOS 88 09/12/2020   AST 37 09/12/2020   ALT 65 (H) 09/12/2020   PROT 7.1 09/12/2020   ALBUMIN 5.0 (H) 09/12/2020   CALCIUM 9.9 09/12/2020   Lab Results  Component Value Date   CHOL 135 09/12/2020   Lab Results  Component Value Date   HDL 40 09/12/2020   Lab Results  Component Value Date   LDLCALC 73 09/12/2020   Lab Results  Component Value Date   TRIG 121 09/12/2020   Lab Results  Component Value Date   CHOLHDL 3.4 09/12/2020   Lab Results  Component Value Date   HGBA1C 6.3 (H) 09/12/2020       Assessment & Plan:   1. Internal hemorrhoid  - hydrocortisone (ANUSOL-HC) 2.5 % rectal cream; Place 1 application rectally 2 (two) times daily.  Dispense: 30 g; Refill: 0    Am the supervising physician for  Aldine Contes, PA and I am signing off on this unfinished note with no further clinical information Megan Mans., MD    Paticia Stack, CMA

## 2021-01-01 DIAGNOSIS — E119 Type 2 diabetes mellitus without complications: Secondary | ICD-10-CM | POA: Diagnosis not present

## 2021-01-01 LAB — HM DIABETES EYE EXAM

## 2021-01-14 ENCOUNTER — Other Ambulatory Visit: Payer: Self-pay | Admitting: Family Medicine

## 2021-01-14 DIAGNOSIS — I10 Essential (primary) hypertension: Secondary | ICD-10-CM

## 2021-02-06 ENCOUNTER — Telehealth: Payer: Self-pay

## 2021-02-06 NOTE — Telephone Encounter (Signed)
Copied from CRM [id number]. Topic: General - Other >> Feb 06, 2021 11:51 AM Wyonia Hough E wrote: Reason for CRM: Mrs. Sollenberger called in with [place] health care Rep Lajoyce Corners and states that she received a letter stating that there was an authorization for a request from Dr. Sullivan Lone for Skilled Nursing, OT, PT, Hospice and in home therapy for this Pt / she stated the orders were for [facility] [place] and the provider from there was [employer]. I spoke with Judithann Sauger and she stated this seemed to be a scam / [employer] asked if office can call the provider line at [phone] and report this since it was under Dr. Wonda Olds name/ please advise if this came from Dr. Roxine Caddy with the [facility] stated that under doctor name was on the referral. I advised her that Dr.Gilbert has not placed a referral for the patient since January of 2021 and they may want to look more into it before giving any of their information to the place. I also advised that we have a referral team that will reach out to the patient if any referrals was placed.

## 2021-04-11 ENCOUNTER — Other Ambulatory Visit: Payer: Self-pay | Admitting: Family Medicine

## 2021-04-11 DIAGNOSIS — I10 Essential (primary) hypertension: Secondary | ICD-10-CM

## 2021-04-17 ENCOUNTER — Ambulatory Visit: Payer: Medicare Other | Admitting: Family Medicine

## 2021-04-21 ENCOUNTER — Ambulatory Visit: Payer: Medicare Other | Admitting: Dermatology

## 2021-04-28 ENCOUNTER — Ambulatory Visit (INDEPENDENT_AMBULATORY_CARE_PROVIDER_SITE_OTHER): Payer: Medicare Other | Admitting: Family Medicine

## 2021-04-28 ENCOUNTER — Other Ambulatory Visit: Payer: Self-pay

## 2021-04-28 DIAGNOSIS — E119 Type 2 diabetes mellitus without complications: Secondary | ICD-10-CM

## 2021-04-28 DIAGNOSIS — E6609 Other obesity due to excess calories: Secondary | ICD-10-CM

## 2021-04-28 DIAGNOSIS — S61210A Laceration without foreign body of right index finger without damage to nail, initial encounter: Secondary | ICD-10-CM | POA: Diagnosis not present

## 2021-04-28 DIAGNOSIS — Z23 Encounter for immunization: Secondary | ICD-10-CM | POA: Diagnosis not present

## 2021-04-28 DIAGNOSIS — I10 Essential (primary) hypertension: Secondary | ICD-10-CM

## 2021-04-28 DIAGNOSIS — Z6834 Body mass index (BMI) 34.0-34.9, adult: Secondary | ICD-10-CM

## 2021-04-28 LAB — POCT GLYCOSYLATED HEMOGLOBIN (HGB A1C): Hemoglobin A1C: 6.4 % — AB (ref 4.0–5.6)

## 2021-04-28 NOTE — Progress Notes (Signed)
Established patient visit   Patient: Christian [name]   DOB: 1951-10-23   70 y.o. Male  MRN: [id number] Visit Date: 04/28/2021  Today's healthcare provider: Megan Mans, MD   No chief complaint on file.  Subjective    HPI  Patient comes in today for follow-up.  He is feeling well.  He is not really getting any exercise. He does have a small recent laceration which is healing nicely on his left dorsal index finger distally. Diabetes Mellitus Type II, follow-up  Lab Results  Component Value Date   HGBA1C 6.3 (H) 09/12/2020   HGBA1C 6.6 (A) 12/07/2019   HGBA1C 6.3 (A) 06/28/2019   Last seen for diabetes 7 months ago.  Management since then includes continuing the same treatment. He reports good compliance with treatment. He is not having side effects.   Home blood sugar records:     Episodes of hypoglycemia? No    Current insulin regiment: none Most Recent Eye Exam:   Hypertension, follow-up  BP Readings from Last 3 Encounters:  11/07/20 128/71  10/17/20 (!) 141/70  09/12/20 (!) 147/69   Wt Readings from Last 3 Encounters:  11/07/20 236 lb 6.4 oz (107.2 kg)  10/17/20 238 lb (108 kg)  09/12/20 236 lb (107 kg)     He was last seen for hypertension 6 months ago.  BP at that visit was 141/70. Management since that visit includes; Good control.  Patient on olmesartan/amlodipine/HCT He reports good compliance with treatment. He is not having side effects.  He is not exercising. He is not adherent to low salt diet.   Outside blood pressures are .  He does not smoke.  Use of agents associated with hypertension: none.   ---------------------------------------------------------------------------------------------------       Medications: Outpatient Medications Prior to Visit  Medication Sig   albuterol (VENTOLIN HFA) 108 (90 Base) MCG/ACT inhaler Inhale 2 puffs into the lungs every 4 (four) hours as needed for wheezing or shortness of breath.  (Patient not taking: Reported on 11/07/2020)   ALPRAZolam (XANAX) 0.5 MG tablet Take 1 tablet (0.5 mg total) by mouth 2 (two) times daily as needed.   aspirin 81 MG tablet Take 81 mg by mouth daily.    atorvastatin (LIPITOR) 40 MG tablet TAKE 1 TABLET(40 MG) BY MOUTH AT BEDTIME   BREO ELLIPTA 100-25 MCG/INH AEPB INHALE 1 PUFF INTO THE LUNGS DAILY   cholecalciferol (VITAMIN D) 1000 UNITS tablet Take 1,000 Units by mouth daily.    glucosamine-chondroitin 500-400 MG tablet Take 1 tablet by mouth daily.    glucose blood (CONTOUR NEXT TEST) test strip 1 each by Other route daily. Use as instructed   hydrocortisone (ANUSOL-HC) 2.5 % rectal cream Place 1 application rectally 2 (two) times daily.   metFORMIN (GLUCOPHAGE) 500 MG tablet TAKE 1 TABLET(500 MG) BY MOUTH DAILY WITH BREAKFAST   MICROLET LANCETS MISC 1 each by Does not apply route daily.   Olmesartan-amLODIPine-HCTZ 40-10-25 MG TABS TAKE 1 TABLET BY MOUTH DAILY   No facility-administered medications prior to visit.    Review of Systems  Constitutional:  Negative for appetite change, chills and fever.  Respiratory:  Negative for chest tightness, shortness of breath and wheezing.   Cardiovascular:  Negative for chest pain and palpitations.  Gastrointestinal:  Negative for abdominal pain, nausea and vomiting.   Last hemoglobin A1c Lab Results  Component Value Date   HGBA1C 6.4 (A) 04/28/2021       Objective  There were no vitals taken for this visit. BP Readings from Last 3 Encounters:  11/07/20 128/71  10/17/20 (!) 141/70  09/12/20 (!) 147/69   Wt Readings from Last 3 Encounters:  11/07/20 236 lb 6.4 oz (107.2 kg)  10/17/20 238 lb (108 kg)  09/12/20 236 lb (107 kg)       Physical Exam Vitals reviewed.  Constitutional:      Appearance: He is well-developed.  HENT:     Head: Normocephalic and atraumatic.     Right Ear: External ear normal.     Left Ear: External ear normal.     Nose: Nose normal.  Eyes:      Conjunctiva/sclera: Conjunctivae normal.     Pupils: Pupils are equal, round, and reactive to light.  Cardiovascular:     Rate and Rhythm: Normal rate and regular rhythm.     Heart sounds: Normal heart sounds.  Pulmonary:     Effort: Pulmonary effort is normal.     Breath sounds: Normal breath sounds.  Abdominal:     General: Bowel sounds are normal.     Palpations: Abdomen is soft.  Genitourinary:    Penis: Normal.      Prostate: Normal.     Rectum: Normal.  Musculoskeletal:        General: Normal range of motion.     Cervical back: Normal range of motion and neck supple.  Skin:    General: Skin is warm and dry.  Neurological:     General: No focal deficit present.     Mental Status: He is alert and oriented to person, place, and time. Mental status is at baseline.  Psychiatric:        Mood and Affect: Mood normal.        Behavior: Behavior normal.        Thought Content: Thought content normal.        Judgment: Judgment normal.      No results found for any visits on 04/28/21.  Assessment & Plan     1. Diabetes mellitus without complication (HCC) Good control with an A1c of 6.4.  Advised patient to start checking his sugars regularly so he is aware of trends.  Patient on metformin for diabetes and atorvastatin for lipid - POCT glycosylated hemoglobin (Hb A1C)  2. Laceration of right index finger without foreign body, nail damage status unspecified, initial encounter Tetanus updated. - Td : Tetanus/diphtheria >7yo Preservative  free  3. Primary hypertension Good control on olmesartan amlodipine  4. Class 1 obesity due to excess calories with serious comorbidity and body mass index (BMI) of 34.0 to 34.9 in adult Diet and exercise stressed.  Goal of loss of visceral abdominal fat.   No follow-ups on file.      I, Megan Mans, MD, have reviewed all documentation for this visit. The documentation on 05/03/21 for the exam, diagnosis, procedures, and orders are  all accurate and complete.    Mende Biswell Wendelyn Breslow, MD  [facility] [phone] (phone) [phone] (fax)  [facility]

## 2021-07-15 DIAGNOSIS — H189 Unspecified disorder of cornea: Secondary | ICD-10-CM | POA: Diagnosis not present

## 2021-07-21 DIAGNOSIS — H169 Unspecified keratitis: Secondary | ICD-10-CM | POA: Diagnosis not present

## 2021-08-07 ENCOUNTER — Telehealth: Payer: Self-pay

## 2021-08-07 NOTE — Progress Notes (Signed)
Left message for patient to call back and schedule Medicare Annual Wellness Visit (AWV).    Please offer to do virtually or by telephone sometime this week    Last AWV:06/26/2020    Please schedule at anytime with [facility] Health Advisor.    If any questions, please contact me at [phone]  Penne Lash, RMA Care Guide, Embedded Care Coordination [facility]  [place], [place] [address] Direct Dial: [phone] [email or web address]

## 2021-08-13 ENCOUNTER — Ambulatory Visit: Payer: Medicare Other

## 2021-09-23 ENCOUNTER — Ambulatory Visit (INDEPENDENT_AMBULATORY_CARE_PROVIDER_SITE_OTHER): Payer: Medicare Other | Admitting: Family Medicine

## 2021-09-23 ENCOUNTER — Other Ambulatory Visit: Payer: Self-pay

## 2021-09-23 VITALS — BP 121/72 | HR 67 | Temp 98.2°F | Wt 234.9 lb

## 2021-09-23 DIAGNOSIS — J4520 Mild intermittent asthma, uncomplicated: Secondary | ICD-10-CM

## 2021-09-23 DIAGNOSIS — K219 Gastro-esophageal reflux disease without esophagitis: Secondary | ICD-10-CM

## 2021-09-23 DIAGNOSIS — E6609 Other obesity due to excess calories: Secondary | ICD-10-CM

## 2021-09-23 DIAGNOSIS — H9193 Unspecified hearing loss, bilateral: Secondary | ICD-10-CM | POA: Diagnosis not present

## 2021-09-23 DIAGNOSIS — Z0000 Encounter for general adult medical examination without abnormal findings: Secondary | ICD-10-CM | POA: Diagnosis not present

## 2021-09-23 DIAGNOSIS — E119 Type 2 diabetes mellitus without complications: Secondary | ICD-10-CM | POA: Diagnosis not present

## 2021-09-23 DIAGNOSIS — Z6834 Body mass index (BMI) 34.0-34.9, adult: Secondary | ICD-10-CM

## 2021-09-23 DIAGNOSIS — R053 Chronic cough: Secondary | ICD-10-CM

## 2021-09-23 DIAGNOSIS — E782 Mixed hyperlipidemia: Secondary | ICD-10-CM

## 2021-09-23 DIAGNOSIS — I10 Essential (primary) hypertension: Secondary | ICD-10-CM

## 2021-09-23 NOTE — Patient Instructions (Signed)
TRY ROBITUSSIN TWICE DAILY. GET SHINGLES VACCINE.

## 2021-09-23 NOTE — Progress Notes (Signed)
I,[name],acting as a scribe for Megan Mans, MD.,have documented all relevant documentation on the behalf of Megan Mans, MD,as directed by  Megan Mans, MD while in the presence of Megan Mans, MD.   Annual Wellness Visit     Patient: Christian [name], Male    DOB: 03/06/1951, 70 y.o.   MRN: [id number] Visit Date: 09/23/2021  Today's Provider: Megan Mans, MD   Chief Complaint  Patient presents with   Medicare Wellness   Subjective    ORAS SPLITT is a 70 y.o. male who presents today for his Annual Wellness Visit. He reports consuming a general diet. Home exercise routine includes WALKING. He generally feels well. He reports sleeping fairly well. He does not have additional problems to discuss today.  Has been retired for about 6 years and his wife is now retired.  Drinks about a sixpack of beer per week now.  Patient has a very mild chronic cough which is helped by both Breo and albuterol MDIs.  He does not use either one regularly.  It sounds as though he is actually clearing his throat when he is doing this cough. HPI    Medications: Outpatient Medications Prior to Visit  Medication Sig   albuterol (VENTOLIN HFA) 108 (90 Base) MCG/ACT inhaler Inhale 2 puffs into the lungs every 4 (four) hours as needed for wheezing or shortness of breath.   ALPRAZolam (XANAX) 0.5 MG tablet Take 1 tablet (0.5 mg total) by mouth 2 (two) times daily as needed.   aspirin 81 MG tablet Take 81 mg by mouth daily.    atorvastatin (LIPITOR) 40 MG tablet TAKE 1 TABLET(40 MG) BY MOUTH AT BEDTIME   BREO ELLIPTA 100-25 MCG/INH AEPB INHALE 1 PUFF INTO THE LUNGS DAILY   cholecalciferol (VITAMIN D) 1000 UNITS tablet Take 1,000 Units by mouth daily.    glucosamine-chondroitin 500-400 MG tablet Take 1 tablet by mouth daily.    glucose blood (CONTOUR NEXT TEST) test strip 1 each by Other route daily. Use as instructed   hydrocortisone (ANUSOL-HC) 2.5 % rectal  cream Place 1 application rectally 2 (two) times daily.   metFORMIN (GLUCOPHAGE) 500 MG tablet TAKE 1 TABLET(500 MG) BY MOUTH DAILY WITH BREAKFAST   MICROLET LANCETS MISC 1 each by Does not apply route daily.   Olmesartan-amLODIPine-HCTZ 40-10-25 MG TABS TAKE 1 TABLET BY MOUTH DAILY   No facility-administered medications prior to visit.    No Known Allergies  Patient Care Team: Maple Hudson., MD as PCP - General (Family Medicine) Lemar Livings, Merrily Pew, MD as Consulting Physician (General Surgery) [facility], [facility]) Midge Minium, MD as Consulting Physician (Gastroenterology)  Review of Systems  All other systems reviewed and are negative.       Objective    Vitals: BP 121/72 (BP Location: Right Arm, Patient Position: Sitting, Cuff Size: Normal)   Pulse 67   Temp 98.2 F (36.8 C)   Wt 234 lb 14.4 oz (106.5 kg)   SpO2 96%   BMI 33.70 kg/m  BP Readings from Last 3 Encounters:  09/23/21 121/72  11/07/20 128/71  10/17/20 (!) 141/70   Wt Readings from Last 3 Encounters:  09/23/21 234 lb 14.4 oz (106.5 kg)  11/07/20 236 lb 6.4 oz (107.2 kg)  10/17/20 238 lb (108 kg)      Physical Exam Vitals reviewed.  Constitutional:      Appearance: He is well-developed. He is obese.  HENT:  Head: Normocephalic and atraumatic.     Right Ear: External ear normal.     Left Ear: External ear normal.     Nose: Nose normal.  Eyes:     General: No scleral icterus.    Conjunctiva/sclera: Conjunctivae normal.     Pupils: Pupils are equal, round, and reactive to light.  Cardiovascular:     Rate and Rhythm: Normal rate and regular rhythm.     Heart sounds: Normal heart sounds.  Pulmonary:     Effort: Pulmonary effort is normal.     Breath sounds: Normal breath sounds. No wheezing.  Abdominal:     General: Bowel sounds are normal.     Palpations: Abdomen is soft.  Musculoskeletal:     Cervical back: Normal range of motion and neck supple.  Skin:    General:  Skin is warm and dry.  Neurological:     General: No focal deficit present.     Mental Status: He is alert and oriented to person, place, and time.  Psychiatric:        Mood and Affect: Mood normal.        Behavior: Behavior normal.        Thought Content: Thought content normal.        Judgment: Judgment normal.     Results of the Epworth flowsheet 09/23/2021 01/25/2018  Sitting and reading 1 1  Watching TV 1 1  Sitting, inactive in a public place (e.g. a theatre or a meeting) 0 0  As a passenger in a car for an hour without a break 0 0  Lying down to rest in the afternoon when circumstances permit 1 0  Sitting and talking to someone 0 0  Sitting quietly after a lunch without alcohol 0 0  In a car, while stopped for a few minutes in traffic 0 0  Total score 3 2    MMSE - Mini Mental State Exam 09/23/2021  Orientation to time 5  Orientation to Place 5  Registration 3  Attention/ Calculation 5  Recall 3  Language- name 2 objects 2  Language- repeat 1  Language- follow 3 step command 3  Language- read & follow direction 1  Write a sentence 1  Copy design 0  Total score 29    Most recent functional status assessment: In your present state of health, do you have any difficulty performing the following activities: 09/23/2021  Hearing? N  Vision? N  Difficulty concentrating or making decisions? N  Walking or climbing stairs? N  Dressing or bathing? N  Doing errands, shopping? N  Some recent data might be hidden   Most recent fall risk assessment: Fall Risk  06/26/2020  Falls in the past year? 0  Number falls in past yr: 0  Injury with Fall? 0    Most recent depression screenings: PHQ 2/9 Scores 06/26/2020 05/18/2019  PHQ - 2 Score 0 0  PHQ- 9 Score - -   Most recent cognitive screening: 6CIT Screen 01/19/2017  What Year? 0 points  What month? 0 points  What time? 0 points  Count back from 20 0 points  Months in reverse 2 points  Repeat phrase 2 points  Total  Score 4   Most recent Audit-C alcohol use screening Alcohol Use Disorder Test (AUDIT) 06/26/2020  1. How often do you have a drink containing alcohol? 3  2. How many drinks containing alcohol do you have on a typical day when you are drinking? 1  3.  How often do you have six or more drinks on one occasion? 0  AUDIT-C Score 4  4. How often during the last year have you found that you were not able to stop drinking once you had started? 0  5. How often during the last year have you failed to do what was normally expected from you because of drinking? 0  6. How often during the last year have you needed a first drink in the morning to get yourself going after a heavy drinking session? 0  7. How often during the last year have you had a feeling of guilt of remorse after drinking? 0  8. How often during the last year have you been unable to remember what happened the night before because you had been drinking? 0  9. Have you or someone else been injured as a result of your drinking? 0  10. Has a relative or friend or a doctor or another health worker been concerned about your drinking or suggested you cut down? 0  Alcohol Use Disorder Identification Test Final Score (AUDIT) 4  Alcohol Brief Interventions/Follow-up AUDIT Score <7 follow-up not indicated   A score of 3 or more in women, and 4 or more in men indicates increased risk for alcohol abuse, EXCEPT if all of the points are from question 1   No results found for any visits on 09/23/21.  Assessment & Plan     Annual wellness visit done today including the all of the following: Reviewed patient's Family Medical History Reviewed and updated list of patient's medical providers Assessment of cognitive impairment was done Assessed patient's functional ability Established a written schedule for health screening services Health Risk Assessent Completed and Reviewed  Exercise Activities and Dietary recommendations  Goals      DIET - REDUCE  PORTION SIZE     Recommend decreasing portion sizes by half and eating 3 small meals a day with 2 healthy snacks in between.         Immunization History  Administered Date(s) Administered   Fluad Quad(high Dose 65+) 06/28/2019, 09/12/2020   Influenza Split 08/23/2012   Influenza, High Dose Seasonal PF 09/09/2015, 07/14/2016, 07/26/2017, 09/06/2018   Influenza,inj,Quad PF,6+ Mos 09/07/2013   Influenza-Unspecified 09/15/2021   PFIZER(Purple Top)SARS-COV-2 Vaccination 01/01/2020, 01/22/2020, 09/12/2020   Pneumococcal Conjugate-13 01/01/2015   Pneumococcal Polysaccharide-23 01/07/2016   Td 04/28/2021   Tdap 07/29/2011   Zoster, Live 09/07/2013    Health Maintenance  Topic Date Due   Zoster Vaccines- Shingrix (1 of 2) Never done   COVID-19 Vaccine (4 - Booster for Pfizer series) 11/07/2020   OPHTHALMOLOGY EXAM  07/01/2021   COLONOSCOPY (Pts 45-1yrs Insurance coverage will need to be confirmed)  01/11/2030   TETANUS/TDAP  04/29/2031   Pneumonia Vaccine 39+ Years old  Completed   INFLUENZA VACCINE  Completed   Hepatitis C Screening  Completed   HPV VACCINES  Aged Out     Discussed health benefits of physical activity, and encouraged him to engage in regular exercise appropriate for his age and condition.   1. Encounter for Medicare annual wellness exam  - Lipid panel - TSH - CBC w/Diff/Platelet - Comprehensive Metabolic Panel (CMET) - Hemoglobin A1c  2. Annual physical exam   3. Diabetes mellitus without complication (HCC)  - Lipid panel - TSH - CBC w/Diff/Platelet - Comprehensive Metabolic Panel (CMET) - Hemoglobin A1c  4. Primary hypertension  - Lipid panel - TSH - CBC w/Diff/Platelet - Comprehensive Metabolic Panel (CMET) - Hemoglobin  A1c  5. Mixed hyperlipidemia  - Lipid panel - TSH - CBC w/Diff/Platelet - Comprehensive Metabolic Panel (CMET) - Hemoglobin A1c  6. Essential hypertension  - Lipid panel - TSH - CBC w/Diff/Platelet -  Comprehensive Metabolic Panel (CMET) - Hemoglobin A1c  7. Gastroesophageal reflux disease without esophagitis  - Lipid panel - TSH - CBC w/Diff/Platelet - Comprehensive Metabolic Panel (CMET) - Hemoglobin A1c  8. Class 1 obesity due to excess calories with serious comorbidity and body mass index (BMI) of 34.0 to 34.9 in adult  - Lipid panel - TSH - CBC w/Diff/Platelet - Comprehensive Metabolic Panel (CMET) - Hemoglobin A1c  9. Chronic cough Take Breo daily and use albuterol as needed.  Also try Robitussin twice a day.  If cough persist may need pulmonary referral and or spirometry.  10. Mild intermittent reactive airway disease without complication As above.  11. Hearing difficulty of both ears ENT referral when appropriate    Return in about 6 months (around 03/23/2022).     I, Megan Mans, MD, have reviewed all documentation for this visit. The documentation on 09/23/21 for the exam, diagnosis, procedures, and orders are all accurate and complete.    Tayloranne Lekas Wendelyn Breslow, MD  [facility] [phone] (phone) [phone] (fax)  [facility]

## 2021-09-24 LAB — CBC WITH DIFFERENTIAL/PLATELET
Basophils Absolute: 0 10*3/uL (ref 0.0–0.2)
Basos: 1 %
EOS (ABSOLUTE): 0.2 10*3/uL (ref 0.0–0.4)
Eos: 3 %
Hematocrit: 45.7 % (ref 37.5–51.0)
Hemoglobin: 16 g/dL (ref 13.0–17.7)
Immature Grans (Abs): 0 10*3/uL (ref 0.0–0.1)
Immature Granulocytes: 0 %
Lymphocytes Absolute: 2.1 10*3/uL (ref 0.7–3.1)
Lymphs: 29 %
MCH: 32.3 pg (ref 26.6–33.0)
MCHC: 35 g/dL (ref 31.5–35.7)
MCV: 92 fL (ref 79–97)
Monocytes Absolute: 0.6 10*3/uL (ref 0.1–0.9)
Monocytes: 9 %
Neutrophils Absolute: 4.2 10*3/uL (ref 1.4–7.0)
Neutrophils: 58 %
Platelets: 222 10*3/uL (ref 150–450)
RBC: 4.96 x10E6/uL (ref 4.14–5.80)
RDW: 12.3 % (ref 11.6–15.4)
WBC: 7.2 10*3/uL (ref 3.4–10.8)

## 2021-09-24 LAB — COMPREHENSIVE METABOLIC PANEL
ALT: 55 IU/L — ABNORMAL HIGH (ref 0–44)
AST: 32 IU/L (ref 0–40)
Albumin/Globulin Ratio: 2 (ref 1.2–2.2)
Albumin: 4.9 g/dL — ABNORMAL HIGH (ref 3.7–4.7)
Alkaline Phosphatase: 97 IU/L (ref 44–121)
BUN/Creatinine Ratio: 19 (ref 10–24)
BUN: 20 mg/dL (ref 8–27)
Bilirubin Total: 0.7 mg/dL (ref 0.0–1.2)
CO2: 26 mmol/L (ref 20–29)
Calcium: 10 mg/dL (ref 8.6–10.2)
Chloride: 99 mmol/L (ref 96–106)
Creatinine, Ser: 1.04 mg/dL (ref 0.76–1.27)
Globulin, Total: 2.4 g/dL (ref 1.5–4.5)
Glucose: 134 mg/dL — ABNORMAL HIGH (ref 70–99)
Potassium: 4.9 mmol/L (ref 3.5–5.2)
Sodium: 142 mmol/L (ref 134–144)
Total Protein: 7.3 g/dL (ref 6.0–8.5)
eGFR: 76 mL/min/{1.73_m2} (ref >59)

## 2021-09-24 LAB — HEMOGLOBIN A1C
Est. average glucose Bld gHb Est-mCnc: 148 mg/dL
Hgb A1c MFr Bld: 6.8 % — ABNORMAL HIGH (ref 4.8–5.6)

## 2021-09-24 LAB — TSH: TSH: 1.35 u[IU]/mL (ref 0.450–4.500)

## 2021-09-24 LAB — LIPID PANEL
Chol/HDL Ratio: 3.6 ratio (ref 0.0–5.0)
Cholesterol, Total: 136 mg/dL (ref 100–199)
HDL: 38 mg/dL — ABNORMAL LOW (ref >39)
LDL Chol Calc (NIH): 74 mg/dL (ref 0–99)
Triglycerides: 133 mg/dL (ref 0–149)
VLDL Cholesterol Cal: 24 mg/dL (ref 5–40)

## 2021-09-29 ENCOUNTER — Other Ambulatory Visit: Payer: Self-pay | Admitting: Family Medicine

## 2021-09-29 DIAGNOSIS — I10 Essential (primary) hypertension: Secondary | ICD-10-CM

## 2021-09-29 NOTE — Telephone Encounter (Signed)
Requested Prescriptions  Pending Prescriptions Disp Refills  . Olmesartan-amLODIPine-HCTZ 40-10-25 MG TABS [Pharmacy Med Name: OLMESARTAN/AMLOD/HCTZ 40-10-25MG  T] 90 tablet 0    Sig: TAKE 1 TABLET BY MOUTH DAILY     Cardiovascular: CCB + ARB + Diuretic Combos Passed - 09/29/2021  8:53 AM      Passed - K in normal range and within 180 days    Potassium  Date Value Ref Range Status  09/23/2021 4.9 3.5 - 5.2 mmol/L Final         Passed - Na in normal range and within 180 days    Sodium  Date Value Ref Range Status  09/23/2021 142 134 - 144 mmol/L Final         Passed - Cr in normal range and within 180 days    Creatinine, Ser  Date Value Ref Range Status  09/23/2021 1.04 0.76 - 1.27 mg/dL Final         Passed - Ca in normal range and within 180 days    Calcium  Date Value Ref Range Status  09/23/2021 10.0 8.6 - 10.2 mg/dL Final         Passed - Patient is not pregnant      Passed - Last BP in normal range    BP Readings from Last 1 Encounters:  09/23/21 121/72         Passed - Last Heart Rate in normal range    Pulse Readings from Last 1 Encounters:  09/23/21 67         Passed - Valid encounter within last 6 months    Recent Outpatient Visits          6 days ago Encounter for [employer] annual wellness exam   [facility] Maple Hudson., MD   5 months ago Diabetes mellitus without complication [facility])   [facility] Maple Hudson., MD   10 months ago Internal hemorrhoid   [facility] [place], [place], [place]   11 months ago Chronic cough   [facility] Maple Hudson., MD   1 year ago Annual physical exam   [facility] Maple Hudson., MD      Future Appointments            In 5 months Maple Hudson., MD [facility]

## 2021-10-03 ENCOUNTER — Other Ambulatory Visit: Payer: Self-pay | Admitting: Family Medicine

## 2021-10-05 DIAGNOSIS — R059 Cough, unspecified: Secondary | ICD-10-CM | POA: Diagnosis not present

## 2021-10-17 ENCOUNTER — Other Ambulatory Visit: Payer: Self-pay | Admitting: Family Medicine

## 2021-10-17 DIAGNOSIS — I10 Essential (primary) hypertension: Secondary | ICD-10-CM

## 2021-10-27 ENCOUNTER — Ambulatory Visit: Payer: Self-pay | Admitting: Family Medicine

## 2021-11-05 ENCOUNTER — Telehealth: Payer: Medicare Other | Admitting: Physician Assistant

## 2021-11-05 ENCOUNTER — Ambulatory Visit: Payer: Self-pay

## 2021-11-05 DIAGNOSIS — U071 COVID-19: Secondary | ICD-10-CM | POA: Diagnosis not present

## 2021-11-05 MED ORDER — BENZONATATE 100 MG PO CAPS: 100.0000 mg | ORAL_CAPSULE | Freq: Three times a day (TID) | ORAL | 0 refills | Status: DC | PRN

## 2021-11-05 MED ORDER — MOLNUPIRAVIR EUA 200MG CAPSULE: 4.0000 | ORAL_CAPSULE | Freq: Two times a day (BID) | ORAL | 0 refills | Status: AC

## 2021-11-05 NOTE — Telephone Encounter (Deleted)
°  Chief Complaint: Cough Symptoms: Cough,congestion, headache Frequency: Yesterday Pertinent Negatives: Patient denies fever Disposition: [] ED /[] Urgent Care (no appt availability in office) / [] Appointment(In office/virtual)/ [x]  [facility]/ [] Home Care/ [] Refused Recommended Disposition  Additional Notes:       Reason for Disposition  MILD difficulty breathing (e.g., minimal/no SOB at rest, SOB with walking, pulse <100)  Answer Assessment - Initial Assessment Questions 1. COVID-19 DIAGNOSIS: "Who made your COVID-19 diagnosis?" "Was it confirmed by a positive lab test or self-test?" If not diagnosed by a doctor (or NP/PA), ask "Are there lots of cases (community spread) where you live?" Note: See public health department website, if unsure.     *** 2. COVID-19 EXPOSURE: "Was there any known exposure to COVID before the symptoms began?" CDC Definition of close contact: within 6 feet (2 meters) for a total of 15 minutes or more over a 24-hour period.      *** 3. ONSET: "When did the COVID-19 symptoms start?"      *** 4. WORST SYMPTOM: "What is your worst symptom?" (e.g., cough, fever, shortness of breath, muscle aches)     *** 5. COUGH: "Do you have a cough?" If Yes, ask: "How bad is the cough?"       *** 6. FEVER: "Do you have a fever?" If Yes, ask: "What is your temperature, how was it measured, and when did it start?"     *** 7. RESPIRATORY STATUS: "Describe your breathing?" (e.g., shortness of breath, wheezing, unable to speak)      *** 8. BETTER-SAME-WORSE: "Are you getting better, staying the same or getting worse compared to yesterday?"  If getting worse, ask, "In what way?"     *** 9. HIGH RISK DISEASE: "Do you have any chronic medical problems?" (e.g., asthma, heart or lung disease, weak immune system, obesity, etc.)     *** 10. VACCINE: "Have you had the COVID-19 vaccine?" If Yes, ask: "Which one, how many shots, when did you get it?"       *** 11. BOOSTER:  "Have you received your COVID-19 booster?" If Yes, ask: "Which one and when did you get it?"       *** 12. PREGNANCY: "Is there any chance you are pregnant?" "When was your last menstrual period?"       *** 13. OTHER SYMPTOMS: "Do you have any other symptoms?"  (e.g., chills, fatigue, headache, loss of smell or taste, muscle pain, sore throat)       *** 14. O2 SATURATION MONITOR:  "Do you use an oxygen saturation monitor (pulse oximeter) at home?" If Yes, ask "What is your reading (oxygen level) today?" "What is your usual oxygen saturation reading?" (e.g., 95%)       ***  Protocols used: Coronavirus (COVID-19) Diagnosed or Suspected-A-AH

## 2021-11-05 NOTE — Progress Notes (Signed)
Virtual Visit Consent   Christian [name], you are scheduled for a virtual visit with a [facility] provider today.     Just as with appointments in the office, your consent must be obtained to participate.  Your consent will be active for this visit and any virtual visit you may have with one of our providers in the next 365 days.     If you have a MyChart account, a copy of this consent can be sent to you electronically.  All virtual visits are billed to your insurance company just like a traditional visit in the office.    As this is a virtual visit, video technology does not allow for your provider to perform a traditional examination.  This may limit your provider's ability to fully assess your condition.  If your provider identifies any concerns that need to be evaluated in person or the need to arrange testing (such as labs, EKG, etc.), we will make arrangements to do so.     Although advances in technology are sophisticated, we cannot ensure that it will always work on either your end or our end.  If the connection with a video visit is poor, the visit may have to be switched to a telephone visit.  With either a video or telephone visit, we are not always able to ensure that we have a secure connection.     I need to obtain your verbal consent now.   Are you willing to proceed with your visit today?    Christian [name] has provided verbal consent on 11/05/2021 for a virtual visit (video or telephone).   Margaretann Loveless, PA-C   Date: 11/05/2021 8:11 PM   Virtual Visit via Video Note   I, Margaretann Loveless, connected with  Christian [name]  ([id number], [birth year]) on 11/05/21 at  7:15 PM EST by a video-enabled telemedicine application and verified that I am speaking with the correct person using two identifiers.  Location: Patient: Virtual Visit Location Patient: Home Provider: Virtual Visit Location Provider: Home Office   I discussed the limitations of evaluation and  management by telemedicine and the availability of in person appointments. The patient expressed understanding and agreed to proceed.    History of Present Illness: Christian [name] is a 70 y.o. who identifies as a male who was assigned male at birth, and is being seen today for Covid 70.  HPI: URI  This is a new problem. Episode onset: symptoms started yesterday; tested positive for covid 19 last night. The problem has been gradually worsening. Maximum temperature: subjective fevers. Associated symptoms include congestion, coughing, headaches, rhinorrhea, sinus pain, a sore throat and wheezing. Pertinent negatives include no diarrhea, ear pain, nausea, plugged ear sensation or vomiting. Associated symptoms comments: Chills, body aches, post nasal drainage. Treatments tried: tylenol, cough drops. The treatment provided no relief.     Problems:  Patient Active Problem List   Diagnosis Date Noted   RAD (reactive airway disease) 12/24/2018   Adiposity 11/26/2018   Hypercalcemia 07/01/2015   Anxiety 05/08/2015   Depression, major, single episode, mild (HCC) 05/08/2015   Acid reflux 05/08/2015   Difficulty hearing 05/08/2015   Blood glucose elevated 05/08/2015   HLD (hyperlipidemia) 05/08/2015   BP (high blood pressure) 05/08/2015   Cannot sleep 05/08/2015   Osteoarthrosis 05/08/2015   Avitaminosis D 05/08/2015    Allergies: No Known Allergies Medications:  Current Outpatient Medications:    benzonatate (TESSALON) 100 MG capsule, Take 1 capsule (100  mg total) by mouth 3 (three) times daily as needed., Disp: 30 capsule, Rfl: 0   molnupiravir EUA (LAGEVRIO) 200 mg CAPS capsule, Take 4 capsules (800 mg total) by mouth 2 (two) times daily for 5 days., Disp: 40 capsule, Rfl: 0   albuterol (VENTOLIN HFA) 108 (90 Base) MCG/ACT inhaler, Inhale 2 puffs into the lungs every 4 (four) hours as needed for wheezing or shortness of breath., Disp: 18 g, Rfl: 2   ALPRAZolam (XANAX) 0.5 MG tablet, Take 1  tablet (0.5 mg total) by mouth 2 (two) times daily as needed., Disp: 60 tablet, Rfl: 5   aspirin 81 MG tablet, Take 81 mg by mouth daily. , Disp: , Rfl:    atorvastatin (LIPITOR) 40 MG tablet, TAKE 1 TABLET(40 MG) BY MOUTH AT BEDTIME, Disp: 90 tablet, Rfl: 2   BREO ELLIPTA 100-25 MCG/INH AEPB, INHALE 1 PUFF INTO THE LUNGS DAILY, Disp: 60 each, Rfl: 2   cholecalciferol (VITAMIN D) 1000 UNITS tablet, Take 1,000 Units by mouth daily. , Disp: , Rfl:    glucosamine-chondroitin 500-400 MG tablet, Take 1 tablet by mouth daily. , Disp: , Rfl:    glucose blood (CONTOUR NEXT TEST) test strip, 1 each by Other route daily. Use as instructed, Disp: 100 each, Rfl: 12   hydrocortisone (ANUSOL-HC) 2.5 % rectal cream, Place 1 application rectally 2 (two) times daily., Disp: 30 g, Rfl: 0   metFORMIN (GLUCOPHAGE) 500 MG tablet, TAKE 1 TABLET(500 MG) BY MOUTH DAILY WITH BREAKFAST, Disp: 90 tablet, Rfl: 3   MICROLET LANCETS MISC, 1 each by Does not apply route daily., Disp: 100 each, Rfl: 3   Olmesartan-amLODIPine-HCTZ 40-10-25 MG TABS, TAKE 1 TABLET BY MOUTH DAILY, Disp: 90 tablet, Rfl: 0  Observations/Objective: Patient is well-developed, well-nourished in no acute distress.  Resting comfortably at home.  Head is normocephalic, atraumatic.  No labored breathing.  Speech is clear and coherent with logical content.  Patient is alert and oriented at baseline.    Assessment and Plan: 1. COVID-19 - molnupiravir EUA (LAGEVRIO) 200 mg CAPS capsule; Take 4 capsules (800 mg total) by mouth 2 (two) times daily for 5 days.  Dispense: 40 capsule; Refill: 0 - benzonatate (TESSALON) 100 MG capsule; Take 1 capsule (100 mg total) by mouth 3 (three) times daily as needed.  Dispense: 30 capsule; Refill: 0  - Continue OTC symptomatic management of choice - Will send OTC vitamins and supplement information through AVS - Molnupiravir and tessalon perles prescribed - Patient enrolled in MyChart symptom monitoring - Push  fluids - Rest as needed - Discussed return precautions and when to seek in-person evaluation, sent via AVS as well   Follow Up Instructions: I discussed the assessment and treatment plan with the patient. The patient was provided an opportunity to ask questions and all were answered. The patient agreed with the plan and demonstrated an understanding of the instructions.  A copy of instructions were sent to the patient via MyChart unless otherwise noted below.    The patient was advised to call back or seek an in-person evaluation if the symptoms worsen or if the condition fails to improve as anticipated.  Time:  I spent 23 minutes with the patient via telehealth technology discussing the above problems/concerns.    Margaretann Loveless, PA-C

## 2021-11-05 NOTE — Telephone Encounter (Signed)
Summary: Covid positive/Congestion, headaches,sore around rib cage from cough   Pt wife Clydie Braun, stated pt tested positive for covid today . Pt stated no sob, no chest pain, sore around rib cage from cough, pt uses inhaler every now and then. Congestion, headaches .Marland Kitchen  Unable to schedule an appointment until 11/11/2021.      Chief Complaint: Cough Symptoms: Cough, congestion, headache Frequency: Yesterday Pertinent Negatives: Patient denies fever Disposition: [] ED /[] Urgent Care (no appt availability in office) / [] Appointment(In office/virtual)/ [x]  [facility]/ [] Home Care/ [] Refused Recommended Disposition  Additional Notes:  Answer Assessment - Initial Assessment Questions 1. COVID-19 DIAGNOSIS: "Who made your COVID-19 diagnosis?" "Was it confirmed by a positive lab test or self-test?" If not diagnosed by a doctor (or NP/PA), ask "Are there lots of cases (community spread) where you live?" Note: See public health department website, if unsure.     Home test 2. COVID-19 EXPOSURE: "Was there any known exposure to COVID before the symptoms began?" CDC Definition of close contact: within 6 feet (2 meters) for a total of 15 minutes or more over a 24-hour period.      No 3. ONSET: "When did the COVID-19 symptoms start?"      Yesterday 4. WORST SYMPTOM: "What is your worst symptom?" (e.g., cough, fever, shortness of breath, muscle aches)     Cough 5. COUGH: "Do you have a cough?" If Yes, ask: "How bad is the cough?"       Yes 6. FEVER: "Do you have a fever?" If Yes, ask: "What is your temperature, how was it measured, and when did it start?"     No 7. RESPIRATORY STATUS: "Describe your breathing?" (e.g., shortness of breath, wheezing, unable to speak)      Wheezing 8. BETTER-SAME-WORSE: "Are you getting better, staying the same or getting worse compared to yesterday?"  If getting worse, ask, "In what way?"     Worse 9. HIGH RISK DISEASE: "Do you have any chronic medical problems?"  (e.g., asthma, heart or lung disease, weak immune system, obesity, etc.)     Asthma 10. VACCINE: "Have you had the COVID-19 vaccine?" If Yes, ask: "Which one, how many shots, when did you get it?"       yes 11. BOOSTER: "Have you received your COVID-19 booster?" If Yes, ask: "Which one and when did you get it?"       Yes 12. PREGNANCY: "Is there any chance you are pregnant?" "When was your last menstrual period?"       N/a 13. OTHER SYMPTOMS: "Do you have any other symptoms?"  (e.g., chills, fatigue, headache, loss of smell or taste, muscle pain, sore throat)       Headache 14. O2 SATURATION MONITOR:  "Do you use an oxygen saturation monitor (pulse oximeter) at home?" If Yes, ask "What is your reading (oxygen level) today?" "What is your usual oxygen saturation reading?" (e.g., 95%)       No  Protocols used: Coronavirus (COVID-19) Diagnosed or Suspected-A-AH

## 2021-11-05 NOTE — Patient Instructions (Signed)
Christian [name], thank you for joining Margaretann Loveless, PA-C for today's virtual visit.  While this provider is not your primary care provider (PCP), if your PCP is located in our provider database this encounter information will be shared with them immediately following your visit.  Consent: (Patient) Christian [name] provided verbal consent for this virtual visit at the beginning of the encounter.  Current Medications:  Current Outpatient Medications:    benzonatate (TESSALON) 100 MG capsule, Take 1 capsule (100 mg total) by mouth 3 (three) times daily as needed., Disp: 30 capsule, Rfl: 0   molnupiravir EUA (LAGEVRIO) 200 mg CAPS capsule, Take 4 capsules (800 mg total) by mouth 2 (two) times daily for 5 days., Disp: 40 capsule, Rfl: 0   albuterol (VENTOLIN HFA) 108 (90 Base) MCG/ACT inhaler, Inhale 2 puffs into the lungs every 4 (four) hours as needed for wheezing or shortness of breath., Disp: 18 g, Rfl: 2   ALPRAZolam (XANAX) 0.5 MG tablet, Take 1 tablet (0.5 mg total) by mouth 2 (two) times daily as needed., Disp: 60 tablet, Rfl: 5   aspirin 81 MG tablet, Take 81 mg by mouth daily. , Disp: , Rfl:    atorvastatin (LIPITOR) 40 MG tablet, TAKE 1 TABLET(40 MG) BY MOUTH AT BEDTIME, Disp: 90 tablet, Rfl: 2   BREO ELLIPTA 100-25 MCG/INH AEPB, INHALE 1 PUFF INTO THE LUNGS DAILY, Disp: 60 each, Rfl: 2   cholecalciferol (VITAMIN D) 1000 UNITS tablet, Take 1,000 Units by mouth daily. , Disp: , Rfl:    glucosamine-chondroitin 500-400 MG tablet, Take 1 tablet by mouth daily. , Disp: , Rfl:    glucose blood (CONTOUR NEXT TEST) test strip, 1 each by Other route daily. Use as instructed, Disp: 100 each, Rfl: 12   hydrocortisone (ANUSOL-HC) 2.5 % rectal cream, Place 1 application rectally 2 (two) times daily., Disp: 30 g, Rfl: 0   metFORMIN (GLUCOPHAGE) 500 MG tablet, TAKE 1 TABLET(500 MG) BY MOUTH DAILY WITH BREAKFAST, Disp: 90 tablet, Rfl: 3   MICROLET LANCETS MISC, 1 each by Does not apply  route daily., Disp: 100 each, Rfl: 3   Olmesartan-amLODIPine-HCTZ 40-10-25 MG TABS, TAKE 1 TABLET BY MOUTH DAILY, Disp: 90 tablet, Rfl: 0   Medications ordered in this encounter:  Meds ordered this encounter  Medications   molnupiravir EUA (LAGEVRIO) 200 mg CAPS capsule    Sig: Take 4 capsules (800 mg total) by mouth 2 (two) times daily for 5 days.    Dispense:  40 capsule    Refill:  0    Order Specific Question:   Supervising Provider    Answer:   MILLER, BRIAN [[id number]]   benzonatate (TESSALON) 100 MG capsule    Sig: Take 1 capsule (100 mg total) by mouth 3 (three) times daily as needed.    Dispense:  30 capsule    Refill:  0    Order Specific Question:   Supervising Provider    Answer:   Hyacinth Meeker, BRIAN [[id number]]     *If you need refills on other medications prior to your next appointment, please contact your pharmacy*  Follow-Up: Call back or seek an in-person evaluation if the symptoms worsen or if the condition fails to improve as anticipated.  Other Instructions Molnupiravir Oral Capsules What is this medication? MOLNUPIRAVIR (mol nue pir a vir) treats COVID-19. It is an antiviral medication. It may decrease the risk of developing severe symptoms of COVID-19. It may also decrease the chance of going to the hospital. This  medication is not approved by the FDA. The FDA has authorized emergency use of this medication during the COVID-19 pandemic. This medicine may be used for other purposes; ask your health care provider or pharmacist if you have questions. COMMON BRAND NAME(S): LAGEVRIO What should I tell my care team before I take this medication? They need to know if you have any of these conditions: Any allergies Any serious illness An unusual or allergic reaction to molnupiravir, other medications, foods, dyes, or preservatives Pregnant or trying to get pregnant Breast-feeding How should I use this medication? Take this medication by mouth with water. Take it as directed on  the prescription label at the same time every day. Do not cut, crush or chew this medication. Swallow the capsules whole. You can take it with or without food. If it upsets your stomach, take it with food. Take all of this medication unless your care team tells you to stop it early. Keep taking it even if you think you are better. Talk to your care team about the use of this medication in children. Special care may be needed. Overdosage: If you think you have taken too much of this medicine contact a poison control center or emergency room at once. NOTE: This medicine is only for you. Do not share this medicine with others. What if I miss a dose? If you miss a dose, take it as soon as you can unless it is more than 10 hours late. If it is more than 10 hours late, skip the missed dose. Take the next dose at the normal time. Do not take extra or 2 doses at the same time to make up for the missed dose. What may interact with this medication? Interactions have not been studied. This list may not describe all possible interactions. Give your health care provider a list of all the medicines, herbs, non-prescription drugs, or dietary supplements you use. Also tell them if you smoke, drink alcohol, or use illegal drugs. Some items may interact with your medicine. What should I watch for while using this medication? Your condition will be monitored carefully while you are receiving this medication. Visit your care team for regular checkups. Tell your care team if your symptoms do not start to get better or if they get worse. Do not become pregnant while taking this medication. You may need a pregnancy test before starting this medication. Women must use a reliable form of birth control while taking this medication and for 4 days after stopping the medication. Women should inform their care team if they wish to become pregnant or think they might be pregnant. Men should not father a child while taking this  medication and for 3 months after stopping it. There is potential for serious harm to an unborn child. Talk to your care team for more information. Do not breast-feed an infant while taking this medication and for 4 days after stopping the medication. What side effects may I notice from receiving this medication? Side effects that you should report to your care team as soon as possible: Allergic reactions--skin rash, itching, hives, swelling of the face, lips, tongue, or throat Side effects that usually do not require medical attention (report these to your care team if they continue or are bothersome): Diarrhea Dizziness Nausea This list may not describe all possible side effects. Call your doctor for medical advice about side effects. You may report side effects to FDA at 1-800-FDA-1088. Where should I keep my medication? Keep out  of the reach of children and pets. Store at room temperature between 20 and 25 degrees C (68 and 77 degrees F). Get rid of any unused medication after the expiration date. To get rid of medications that are no longer needed or have expired: Take the medication to a medication take-back program. Check with your pharmacy or law enforcement to find a location. If you cannot return the medication, check the label or package insert to see if the medication should be thrown out in the garbage or flushed down the toilet. If you are not sure, ask your care team. If it is safe to put it in the trash, take the medication out of the container. Mix the medication with cat litter, dirt, coffee grounds, or other unwanted substance. Seal the mixture in a bag or container. Put it in the trash. NOTE: This sheet is a summary. It may not cover all possible information. If you have questions about this medicine, talk to your doctor, pharmacist, or health care provider.  2022 Elsevier/Gold Standard (2020-11-04 00:00:00)   10 Things You Can Do to Manage Your COVID-19 Symptoms at Home If  you have possible or confirmed COVID-19 Stay home except to get medical care. Monitor your symptoms carefully. If your symptoms get worse, call your healthcare provider immediately. Get rest and stay hydrated. If you have a medical appointment, call the healthcare provider ahead of time and tell them that you have or may have COVID-19. For medical emergencies, call 911 and notify the dispatch personnel that you have or may have COVID-19. Cover your cough and sneezes with a tissue or use the inside of your elbow. Wash your hands often with soap and water for at least 20 seconds or clean your hands with an alcohol-based hand sanitizer that contains at least 60% alcohol. As much as possible, stay in a specific room and away from other people in your home. Also, you should use a separate bathroom, if available. If you need to be around other people in or outside of the home, wear a mask. Avoid sharing personal items with other people in your household, like dishes, towels, and bedding. Clean all surfaces that are touched often, like counters, tabletops, and doorknobs. Use household cleaning sprays or wipes according to the label instructions. [email or web address] 05/24/2020 This information is not intended to replace advice given to you by your health care provider. Make sure you discuss any questions you have with your health care provider. Document Revised: 07/18/2021 Document Reviewed: 07/18/2021 Elsevier Patient Education  2022 [employer].    If you have been instructed to have an in-person evaluation today at a local Urgent Care facility, please use the link below. It will take you to a list of all of our available [facility], including address, phone number and hours of operation. Please do not delay care.  [facility]  If you or a family member do not have a primary care provider, use the link below to schedule a visit and establish care. When you choose a [facility] primary care physician or advanced practice provider, you gain a long-term partner in health. Find a Primary Care Provider  Learn more about [facility]'s in-office and virtual care options: [facility] - Get Care Now

## 2021-11-06 NOTE — Progress Notes (Signed)
No show

## 2022-01-12 ENCOUNTER — Other Ambulatory Visit: Payer: Self-pay

## 2022-01-12 ENCOUNTER — Ambulatory Visit (INDEPENDENT_AMBULATORY_CARE_PROVIDER_SITE_OTHER): Payer: Medicare Other | Admitting: Family Medicine

## 2022-01-12 ENCOUNTER — Encounter: Payer: Self-pay | Admitting: Family Medicine

## 2022-01-12 VITALS — BP 133/81 | HR 62 | Temp 98.1°F | Resp 16 | Ht 74.0 in | Wt 230.0 lb

## 2022-01-12 DIAGNOSIS — U071 COVID-19: Secondary | ICD-10-CM

## 2022-01-12 DIAGNOSIS — J4520 Mild intermittent asthma, uncomplicated: Secondary | ICD-10-CM

## 2022-01-12 DIAGNOSIS — E119 Type 2 diabetes mellitus without complications: Secondary | ICD-10-CM | POA: Diagnosis not present

## 2022-01-12 DIAGNOSIS — I10 Essential (primary) hypertension: Secondary | ICD-10-CM

## 2022-01-12 DIAGNOSIS — E782 Mixed hyperlipidemia: Secondary | ICD-10-CM

## 2022-01-12 DIAGNOSIS — R053 Chronic cough: Secondary | ICD-10-CM | POA: Diagnosis not present

## 2022-01-12 LAB — POCT GLYCOSYLATED HEMOGLOBIN (HGB A1C)
Est. average glucose Bld gHb Est-mCnc: 137
Hemoglobin A1C: 6.4 % — AB (ref 4.0–5.6)

## 2022-01-12 NOTE — Progress Notes (Signed)
?  ? ? ?Established patient visit ? ?I,[name],acting as a scribe for [name], MD.,have documented all relevant documentation on the behalf of [name], MD,as directed by  [name], MD while in the presence of [name], MD. ? ? ?Patient: Christian [name]   DOB: [birth year]   71 y.o. Male  MRN: [id number] ?Visit Date: 01/12/2022 ? ?Today's healthcare provider: [name], MD  ? ?Chief Complaint  ?Patient presents with  ? Follow-up  ? Diabetes  ? Hypertension  ? ?Subjective  ?  ?HPI  ?Patient comes in today for follow-up.  He is feeling well.  ?His breathing and cough are improved and he is interested in possibly stopping the Breo inhaler ? ?Diabetes Mellitus Type II, follow-up ? ?Lab Results  ?Component Value Date  ? HGBA1C 6.8 (H) 09/23/2021  ? HGBA1C 6.4 (A) 04/28/2021  ? HGBA1C 6.3 (H) 09/12/2020  ? ?Last seen for diabetes 4 months ago.  ?Management since then includes continuing the same treatment. ?He reports good compliance with treatment. ?He is not having side effects. none ? ?Home blood sugar records: fasting range: not checking ? ?Episodes of hypoglycemia? No none ?  ?Current insulin regiment: n/a ?Most Recent Eye Exam: 01/01/21 ? ?--------------------------------------------------------------------------------------------------- ?Hypertension, follow-up ? ?BP Readings from Last 3 Encounters:  ?01/12/22 133/81  ?09/23/21 121/72  ?11/07/20 128/71  ? Wt Readings from Last 3 Encounters:  ?01/12/22 230 lb (104.3 kg)  ?09/23/21 234 lb 14.4 oz (106.5 kg)  ?11/07/20 236 lb 6.4 oz (107.2 kg)  ?  ? ?He was last seen for hypertension 4 months ago.  ?BP at that visit was 121/72. ?Management since that visit includes; on Olmesartan-amlodipine-HCTZ 40-10-25 mg.  ?He reports good compliance with treatment. ?He is not having side effects. none ?He is not exercising. ?He is not adherent to low salt diet.   ?Outside blood pressures are not checking. ? ?He does not  smoke. ? ?Use of agents associated with hypertension: none.  ? ?--------------------------------------------------------------------------------------------------- ? ? ?Medications: ?Outpatient Medications Prior to Visit  ?Medication Sig  ? albuterol (VENTOLIN HFA) 108 (90 Base) MCG/ACT inhaler Inhale 2 puffs into the lungs every 4 (four) hours as needed for wheezing or shortness of breath.  ? aspirin 81 MG tablet Take 81 mg by mouth daily.   ? atorvastatin (LIPITOR) 40 MG tablet TAKE 1 TABLET(40 MG) BY MOUTH AT BEDTIME  ? BREO ELLIPTA 100-25 MCG/INH AEPB INHALE 1 PUFF INTO THE LUNGS DAILY  ? cholecalciferol (VITAMIN D) 1000 UNITS tablet Take 1,000 Units by mouth daily.   ? glucosamine-chondroitin 500-400 MG tablet Take 1 tablet by mouth daily.   ? glucose blood (CONTOUR NEXT TEST) test strip 1 each by Other route daily. Use as instructed  ? metFORMIN (GLUCOPHAGE) 500 MG tablet TAKE 1 TABLET(500 MG) BY MOUTH DAILY WITH BREAKFAST  ? MICROLET LANCETS MISC 1 each by Does not apply route daily.  ? Olmesartan-amLODIPine-HCTZ 40-10-25 MG TABS TAKE 1 TABLET BY MOUTH DAILY  ? [DISCONTINUED] ALPRAZolam (XANAX) 0.5 MG tablet Take 1 tablet (0.5 mg total) by mouth 2 (two) times daily as needed.  ? [DISCONTINUED] benzonatate (TESSALON) 100 MG capsule Take 1 capsule (100 mg total) by mouth 3 (three) times daily as needed.  ? [DISCONTINUED] hydrocortisone (ANUSOL-HC) 2.5 % rectal cream Place 1 application rectally 2 (two) times daily.  ? ?No facility-administered medications prior to visit.  ? ? ?Review of Systems  ?Constitutional:  Negative for appetite change, chills and fever.  ?  Respiratory:  Negative for chest tightness, shortness of breath and wheezing.   ?Cardiovascular:  Negative for chest pain and palpitations.  ?Gastrointestinal:  Negative for abdominal pain, nausea and vomiting.  ? ?Last hemoglobin A1c ?Lab Results  ?Component Value Date  ? HGBA1C 6.4 (A) 01/12/2022  ? ?  ?  Objective  ?  ?BP 133/81 (BP Location: Left  Arm, Patient Position: Sitting, Cuff Size: Large)   Pulse 62   Temp 98.1 ?F (36.7 ?C) (Temporal)   Resp 16   Ht 6\' 2"  (1.88 m)   Wt 230 lb (104.3 kg)   SpO2 96%   BMI 29.53 kg/m?  ?BP Readings from Last 3 Encounters:  ?01/12/22 133/81  ?09/23/21 121/72  ?11/07/20 128/71  ? ?Wt Readings from Last 3 Encounters:  ?01/12/22 230 lb (104.3 kg)  ?09/23/21 234 lb 14.4 oz (106.5 kg)  ?11/07/20 236 lb 6.4 oz (107.2 kg)  ? ?  ? ?Physical Exam ?Vitals reviewed.  ?Constitutional:   ?   Appearance: He is well-developed.  ?HENT:  ?   Head: Normocephalic and atraumatic.  ?   Right Ear: External ear normal.  ?   Left Ear: External ear normal.  ?   Nose: Nose normal.  ?Eyes:  ?   Conjunctiva/sclera: Conjunctivae normal.  ?   Pupils: Pupils are equal, round, and reactive to light.  ?Cardiovascular:  ?   Rate and Rhythm: Normal rate and regular rhythm.  ?   Heart sounds: Normal heart sounds.  ?Pulmonary:  ?   Effort: Pulmonary effort is normal.  ?   Breath sounds: Normal breath sounds.  ?Abdominal:  ?   General: Bowel sounds are normal.  ?   Palpations: Abdomen is soft.  ?Genitourinary: ?   Penis: Normal.   ?   Prostate: Normal.  ?   Rectum: Normal.  ?Musculoskeletal:     ?   General: Normal range of motion.  ?   Cervical back: Normal range of motion and neck supple.  ?Skin: ?   General: Skin is warm and dry.  ?Neurological:  ?   General: No focal deficit present.  ?   Mental Status: He is alert and oriented to person, place, and time. Mental status is at baseline.  ?Psychiatric:     ?   Mood and Affect: Mood normal.     ?   Behavior: Behavior normal.     ?   Thought Content: Thought content normal.     ?   Judgment: Judgment normal.  ?  ? ? ?No results found for any visits on 01/12/22. ? Assessment & Plan  ?  ? ?1. Diabetes mellitus without complication ([place]) ?Once he has improved from 6.8-6.4.  Continue to work on diet and exercise. ?- POCT glycosylated hemoglobin (Hb A1C) ? ?2. Primary hypertension ?Good control. ? ?3. Mild  intermittent reactive airway disease without complication ?Patient will try coming off the [facility] inhaler. ? ?4. Chronic cough ?Improved on inhaler ? ?5. COVID-19 ?Ablation of COVID infection was the source of his cough in addition to some reactive airway disease ? ?6. Mixed hyperlipidemia ?On atorvastatin ? ? ?No follow-ups on file.  ?   ? ?I, [name], MD, have reviewed all documentation for this visit. The documentation on 01/16/22 for the exam, diagnosis, procedures, and orders are all accurate and complete. ? ? ? ?Baxter Gonzalez Cranford Mon, MD  ?[facility] ?[phone] (phone) ?[phone] (fax) ? ?[facility] ?

## 2022-01-13 ENCOUNTER — Other Ambulatory Visit: Payer: Self-pay | Admitting: Family Medicine

## 2022-01-13 DIAGNOSIS — I10 Essential (primary) hypertension: Secondary | ICD-10-CM

## 2022-03-03 DIAGNOSIS — H2513 Age-related nuclear cataract, bilateral: Secondary | ICD-10-CM | POA: Diagnosis not present

## 2022-03-03 DIAGNOSIS — H0100B Unspecified blepharitis left eye, upper and lower eyelids: Secondary | ICD-10-CM | POA: Diagnosis not present

## 2022-03-03 DIAGNOSIS — E119 Type 2 diabetes mellitus without complications: Secondary | ICD-10-CM | POA: Diagnosis not present

## 2022-03-03 DIAGNOSIS — H0100A Unspecified blepharitis right eye, upper and lower eyelids: Secondary | ICD-10-CM | POA: Diagnosis not present

## 2022-03-03 LAB — HM DIABETES EYE EXAM

## 2022-03-20 NOTE — Progress Notes (Signed)
Established patient visit   Patient: Christian [name]   DOB: 18-Jul-1951   71 y.o. Male  MRN: [id number] Visit Date: 03/23/2022  Today's healthcare provider: [name], MD    I,Tiffany Darlina Sicilian as a scribe for [name], MD.,have documented all relevant documentation on the behalf of [name], MD,as directed by  [name], MD while in the presence of [name], MD.   Chief Complaint  Patient presents with   Diabetes   Cough   Subjective    HPI  Patient comes in today for follow-up.  He feels well.  He did have an eye exam recently which showed no retinopathy.  His breathing is better.  Inhalers seem to made a big difference with his breathing and his cough with resolution Diabetes Mellitus Type II, Follow-up  Lab Results  Component Value Date   HGBA1C 6.4 (A) 01/12/2022   HGBA1C 6.8 (H) 09/23/2021   HGBA1C 6.4 (A) 04/28/2021   Wt Readings from Last 3 Encounters:  03/23/22 235 lb 3.2 oz (106.7 kg)  01/12/22 230 lb (104.3 kg)  09/23/21 234 lb 14.4 oz (106.5 kg)   Last seen for diabetes 2 months ago.  Management since then includes continue medications. He reports excellent compliance with treatment. He is not having side effects.  Symptoms: No fatigue No foot ulcerations  No appetite changes No nausea  No paresthesia of the feet  No polydipsia  No polyuria No visual disturbances   No vomiting     Home blood sugar records: not checked  Episodes of hypoglycemia? No    Current insulin regiment: none Most Recent Eye Exam: April 2023 Current exercise: yard work Current diet habits: well balanced  Pertinent Labs: Lab Results  Component Value Date   CHOL 136 09/23/2021   HDL 38 (L) 09/23/2021   LDLCALC 74 09/23/2021   TRIG 133 09/23/2021   CHOLHDL 3.6 09/23/2021   Lab Results  Component Value Date   NA 142 09/23/2021   K 4.9 09/23/2021   CREATININE 1.04 09/23/2021   EGFR 76 09/23/2021      ---------------------------------------------------------------------------------------------------  Follow up for chronic cough  The patient was last seen for this 2 months ago. Changes made at last visit include .  He reports excellent compliance with treatment. He feels that condition is Improved. He is not having side effects.   -----------------------------------------------------------------------------------------  Medications: Outpatient Medications Prior to Visit  Medication Sig   albuterol (VENTOLIN HFA) 108 (90 Base) MCG/ACT inhaler Inhale 2 puffs into the lungs every 4 (four) hours as needed for wheezing or shortness of breath.   aspirin 81 MG tablet Take 81 mg by mouth daily.    atorvastatin (LIPITOR) 40 MG tablet TAKE 1 TABLET(40 MG) BY MOUTH AT BEDTIME   cholecalciferol (VITAMIN D) 1000 UNITS tablet Take 1,000 Units by mouth daily.    glucosamine-chondroitin 500-400 MG tablet Take 1 tablet by mouth daily.    glucose blood (CONTOUR NEXT TEST) test strip 1 each by Other route daily. Use as instructed   metFORMIN (GLUCOPHAGE) 500 MG tablet TAKE 1 TABLET(500 MG) BY MOUTH DAILY WITH BREAKFAST   MICROLET LANCETS MISC 1 each by Does not apply route daily.   Olmesartan-amLODIPine-HCTZ 40-10-25 MG TABS TAKE 1 TABLET BY MOUTH DAILY   BREO ELLIPTA 100-25 MCG/INH AEPB INHALE 1 PUFF INTO THE LUNGS DAILY   No facility-administered medications prior to visit.    Review of Systems  Constitutional:  Negative  for appetite change, chills and fever.  Respiratory:  Negative for chest tightness, shortness of breath and wheezing.   Cardiovascular:  Negative for chest pain and palpitations.  Gastrointestinal:  Negative for abdominal pain, nausea and vomiting.   Last hemoglobin A1c Lab Results  Component Value Date   HGBA1C 6.4 (A) 01/12/2022       Objective    BP 126/77 (BP Location: Right Arm, Patient Position: Sitting, Cuff Size: Normal)   Pulse (!) 56   Temp 98.2 F (36.8  C) (Oral)   Resp 16   Ht $R'5\' 10"'Fh$  (1.778 m)   Wt 235 lb 3.2 oz (106.7 kg)   SpO2 97%   BMI 33.75 kg/m  BP Readings from Last 3 Encounters:  03/23/22 126/77  01/12/22 133/81  09/23/21 121/72   Wt Readings from Last 3 Encounters:  03/23/22 235 lb 3.2 oz (106.7 kg)  01/12/22 230 lb (104.3 kg)  09/23/21 234 lb 14.4 oz (106.5 kg)      Physical Exam Vitals reviewed.  Constitutional:      Appearance: He is well-developed.  HENT:     Head: Normocephalic and atraumatic.     Right Ear: External ear normal.     Left Ear: External ear normal.     Nose: Nose normal.  Eyes:     Conjunctiva/sclera: Conjunctivae normal.     Pupils: Pupils are equal, round, and reactive to light.  Cardiovascular:     Rate and Rhythm: Normal rate and regular rhythm.     Heart sounds: Normal heart sounds.  Pulmonary:     Effort: Pulmonary effort is normal.     Breath sounds: Normal breath sounds.  Abdominal:     General: Bowel sounds are normal.     Palpations: Abdomen is soft.  Musculoskeletal:     Cervical back: Normal range of motion and neck supple.  Skin:    General: Skin is warm and dry.  Neurological:     General: No focal deficit present.     Mental Status: He is alert and oriented to person, place, and time. Mental status is at baseline.  Psychiatric:        Mood and Affect: Mood normal.        Behavior: Behavior normal.        Thought Content: Thought content normal.        Judgment: Judgment normal.      No results found for any visits on 03/23/22.  Assessment & Plan     1. Mild intermittent reactive airway disease without complication Improved on Breo.  2. Gastroesophageal reflux disease without esophagitis   3. Primary hypertension Excellent control.  4. Mixed hyperlipidemia   5. 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 Class 1 obesity due to excess calories with serious comorbidity and body mass index (BMI) of 34.0 to 34.9 in adult    No  follow-ups on file.      I, [name], MD, have reviewed all documentation for this visit. The documentation on 03/26/22 for the exam, diagnosis, procedures, and orders are all accurate and complete.    Gerilynn Mccullars Cranford Mon, MD  [facility] [phone] (phone) [phone] (fax)  [place]

## 2022-03-23 ENCOUNTER — Encounter: Payer: Self-pay | Admitting: Family Medicine

## 2022-03-23 ENCOUNTER — Ambulatory Visit (INDEPENDENT_AMBULATORY_CARE_PROVIDER_SITE_OTHER): Payer: Medicare Other | Admitting: Family Medicine

## 2022-03-23 VITALS — BP 126/77 | HR 56 | Temp 98.2°F | Resp 16 | Ht 70.0 in | Wt 235.2 lb

## 2022-03-23 DIAGNOSIS — J4520 Mild intermittent asthma, uncomplicated: Secondary | ICD-10-CM | POA: Diagnosis not present

## 2022-03-23 DIAGNOSIS — I10 Essential (primary) hypertension: Secondary | ICD-10-CM | POA: Diagnosis not present

## 2022-03-23 DIAGNOSIS — E782 Mixed hyperlipidemia: Secondary | ICD-10-CM

## 2022-03-23 DIAGNOSIS — K219 Gastro-esophageal reflux disease without esophagitis: Secondary | ICD-10-CM

## 2022-03-23 DIAGNOSIS — F320 Major depressive disorder, single episode, mild: Secondary | ICD-10-CM

## 2022-03-23 DIAGNOSIS — E6609 Other obesity due to excess calories: Secondary | ICD-10-CM

## 2022-03-23 DIAGNOSIS — Z6834 Body mass index (BMI) 34.0-34.9, adult: Secondary | ICD-10-CM

## 2022-05-22 ENCOUNTER — Telehealth: Payer: Self-pay | Admitting: Family Medicine

## 2022-05-22 DIAGNOSIS — R053 Chronic cough: Secondary | ICD-10-CM

## 2022-05-22 MED ORDER — FLUTICASONE FUROATE-VILANTEROL 100-25 MCG/ACT IN AEPB: 1.0000 | INHALATION_SPRAY | Freq: Every day | RESPIRATORY_TRACT | 1 refills | Status: DC

## 2022-05-22 MED ORDER — ALBUTEROL SULFATE HFA 108 (90 BASE) MCG/ACT IN AERS: 2.0000 | INHALATION_SPRAY | RESPIRATORY_TRACT | 2 refills | Status: DC | PRN

## 2022-05-22 NOTE — Telephone Encounter (Signed)
Unable to attach Earlie Server for reorder, will route encounter to provider for refill.

## 2022-05-22 NOTE — Telephone Encounter (Signed)
Medication Refill - Medication: albuterol (VENTOLIN HFA) 108 (90 Base) MCG/ACT inhaler  BREO ELLIPTA 100-25 MCG/INH AEPB    Pt's wife called reporting that he lost his inhaler at the [facility]   Has the patient contacted their pharmacy? Yes.   (Agent: If no, request that the patient contact the pharmacy for the refill. If patient does not wish to contact the pharmacy document the reason why and proceed with request.) (Agent: If yes, when and what did the pharmacy advise?)  Preferred Pharmacy (with phone number or street name):  [facility][id number] Nicholes Rough, [place] - [address] AT [facility] OF [address]  [address] [place] [address]  Phone: [phone] Fax: [phone]   Has the patient been seen for an appointment in the last year OR does the patient have an upcoming appointment? Yes.    Agent: Please be advised that RX refills may take up to 3 business days. We ask that you follow-up with your pharmacy.

## 2022-05-22 NOTE — Addendum Note (Signed)
Addended by: Hyacinth Meeker on: 05/22/2022 01:00 PM   Modules accepted: Orders

## 2022-06-15 ENCOUNTER — Ambulatory Visit: Payer: Medicare Other | Admitting: Family Medicine

## 2022-07-19 DIAGNOSIS — S63502A Unspecified sprain of left wrist, initial encounter: Secondary | ICD-10-CM | POA: Diagnosis not present

## 2022-07-28 ENCOUNTER — Ambulatory Visit (INDEPENDENT_AMBULATORY_CARE_PROVIDER_SITE_OTHER): Payer: Medicare Other | Admitting: Family Medicine

## 2022-07-28 ENCOUNTER — Encounter: Payer: Self-pay | Admitting: Family Medicine

## 2022-07-28 VITALS — BP 110/64 | HR 76 | Resp 16 | Wt 234.0 lb

## 2022-07-28 DIAGNOSIS — Z23 Encounter for immunization: Secondary | ICD-10-CM | POA: Diagnosis not present

## 2022-07-28 DIAGNOSIS — J4520 Mild intermittent asthma, uncomplicated: Secondary | ICD-10-CM | POA: Diagnosis not present

## 2022-07-28 DIAGNOSIS — E66811 Obesity, class 1: Secondary | ICD-10-CM

## 2022-07-28 DIAGNOSIS — I10 Essential (primary) hypertension: Secondary | ICD-10-CM

## 2022-07-28 DIAGNOSIS — E6609 Other obesity due to excess calories: Secondary | ICD-10-CM | POA: Diagnosis not present

## 2022-07-28 DIAGNOSIS — Z6834 Body mass index (BMI) 34.0-34.9, adult: Secondary | ICD-10-CM

## 2022-07-28 NOTE — Progress Notes (Signed)
Established patient visit  I,Christian [name],acting as a scribe for Christian Durie, [name].,have documented all relevant documentation on the behalf of Christian Durie, [name],as directed by  Christian Durie, [name] while in the presence of Christian Durie, [name].   Patient: Christian [name]   DOB: 05-09-1951   71 y.o. Male  MRN: [id number] Visit Date: 07/28/2022  Today's healthcare provider: [name], [name]   Chief Complaint  Patient presents with   Follow-up   Hypertension   Subjective    HPI  Patient feels well.  Breo definitely helps his breathing.  He now has good tolerance of any exertion. Hypertension, follow-up  BP Readings from Last 3 Encounters:  07/28/22 110/64  03/23/22 126/77  01/12/22 133/81   Wt Readings from Last 3 Encounters:  07/28/22 234 lb (106.1 kg)  03/23/22 235 lb 3.2 oz (106.7 kg)  01/12/22 230 lb (104.3 kg)     He was last seen for hypertension 4 months ago.  Management since that visit includes; Excellent control..  Outside blood pressures are 136/61.  Pertinent labs Lab Results  Component Value Date   CHOL 136 09/23/2021   HDL 38 (L) 09/23/2021   LDLCALC 74 09/23/2021   TRIG 133 09/23/2021   CHOLHDL 3.6 09/23/2021   Lab Results  Component Value Date   NA 142 09/23/2021   K 4.9 09/23/2021   CREATININE 1.04 09/23/2021   EGFR 76 09/23/2021   GLUCOSE 134 (H) 09/23/2021   TSH 1.350 09/23/2021     The 10-year ASCVD risk score (Arnett DK, et al., 2019) is: 29.3%  ---------------------------------------------------------------------------------------------------   Medications: Outpatient Medications Prior to Visit  Medication Sig   albuterol (VENTOLIN HFA) 108 (90 Base) MCG/ACT inhaler Inhale 2 puffs into the lungs every 4 (four) hours as needed for wheezing or shortness of breath.   aspirin 81 MG tablet Take 81 mg by mouth daily.    atorvastatin (LIPITOR) 40 MG tablet TAKE 1 TABLET(40 MG) BY MOUTH AT BEDTIME   BREO  ELLIPTA 100-25 MCG/INH AEPB INHALE 1 PUFF INTO THE LUNGS DAILY   cholecalciferol (VITAMIN D) 1000 UNITS tablet Take 1,000 Units by mouth daily.    fluticasone furoate-vilanterol (BREO ELLIPTA) 100-25 MCG/ACT AEPB Inhale 1 puff into the lungs daily.   glucose blood (CONTOUR NEXT TEST) test strip 1 each by Other route daily. Use as instructed   metFORMIN (GLUCOPHAGE) 500 MG tablet TAKE 1 TABLET(500 MG) BY MOUTH DAILY WITH BREAKFAST   MICROLET LANCETS MISC 1 each by Does not apply route daily.   naproxen (NAPROSYN) 500 MG tablet Take 500 mg by mouth 2 (two) times daily.   Olmesartan-amLODIPine-HCTZ 40-10-25 MG TABS TAKE 1 TABLET BY MOUTH DAILY   [DISCONTINUED] glucosamine-chondroitin 500-400 MG tablet Take 1 tablet by mouth daily.    No facility-administered medications prior to visit.    Review of Systems  Constitutional:  Negative for appetite change, chills and fever.  Respiratory:  Negative for chest tightness, shortness of breath and wheezing.   Cardiovascular:  Negative for chest pain and palpitations.  Gastrointestinal:  Negative for abdominal pain, nausea and vomiting.    Last hemoglobin A1c Lab Results  Component Value Date   HGBA1C 6.4 (A) 01/12/2022       Objective    BP 110/64 (BP Location: Right Arm, Patient Position: Sitting, Cuff Size: Large)   Pulse 76   Resp 16   Wt 234 lb (106.1 kg)   SpO2 96%   BMI  33.58 kg/m  BP Readings from Last 3 Encounters:  07/28/22 110/64  03/23/22 126/77  01/12/22 133/81   Wt Readings from Last 3 Encounters:  07/28/22 234 lb (106.1 kg)  03/23/22 235 lb 3.2 oz (106.7 kg)  01/12/22 230 lb (104.3 kg)      Physical Exam Vitals reviewed.  Constitutional:      General: He is not in acute distress.    Appearance: He is well-developed.  HENT:     Head: Normocephalic and atraumatic.     Right Ear: Hearing normal.     Left Ear: Hearing normal.     Nose: Nose normal.  Eyes:     General: Lids are normal. No scleral icterus.        Right eye: No discharge.        Left eye: No discharge.     Conjunctiva/sclera: Conjunctivae normal.  Cardiovascular:     Rate and Rhythm: Normal rate and regular rhythm.     Heart sounds: Normal heart sounds.  Pulmonary:     Effort: Pulmonary effort is normal. No respiratory distress.  Skin:    Findings: No lesion or rash.  Neurological:     General: No focal deficit present.     Mental Status: He is alert and oriented to person, place, and time.  Psychiatric:        Mood and Affect: Mood normal.        Speech: Speech normal.        Behavior: Behavior normal.        Thought Content: Thought content normal.        Judgment: Judgment normal.       No results found for any visits on 07/28/22.  Assessment & Plan     1. Essential hypertension Good control  2. Need for immunization against influenza  - Flu Vaccine QUAD High Dose(Fluad)  3. Mild intermittent reactive airway disease without complication Continue Breo daily  4. Class 1 obesity due to excess calories with serious comorbidity and body mass index (BMI) of 34.0 to 34.9 in adult Lifestyle stressed  No follow-ups on file.      I, Christian Durie, [name], have reviewed all documentation for this visit. The documentation on 08/01/22 for the exam, diagnosis, procedures, and orders are all accurate and complete.    Amaziah Raisanen Cranford Mon, [name]  [facility] [phone] (phone) [phone] (fax)  [place]

## 2022-07-30 ENCOUNTER — Telehealth: Payer: Self-pay | Admitting: Family Medicine

## 2022-07-30 ENCOUNTER — Other Ambulatory Visit: Payer: Self-pay

## 2022-07-30 MED ORDER — BREZTRI AEROSPHERE 160-9-4.8 MCG/ACT IN AERO: 2.0000 | INHALATION_SPRAY | Freq: Two times a day (BID) | RESPIRATORY_TRACT | 11 refills | Status: DC

## 2022-07-30 NOTE — Telephone Encounter (Signed)
Medication Refill - Medication: Patient was informed by PCP if the samples of Breztri worked to continue to taking inhaler. Patient is out and would like PCP to send in refills.    Has the patient contacted their pharmacy? No.     Preferred Pharmacy (with phone number or street name):  [facility] #[id number] Lorina Rabon, [place] Phone:  [phone]  Fax:  [phone]      Has the patient been seen for an appointment in the last year OR does the patient have an upcoming appointment? Yes.    Agent: Please be advised that RX refills may take up to 3 business days. We ask that you follow-up with your pharmacy.

## 2022-07-31 ENCOUNTER — Other Ambulatory Visit: Payer: Self-pay | Admitting: *Deleted

## 2022-11-12 ENCOUNTER — Encounter: Payer: Self-pay | Admitting: Family Medicine

## 2022-11-12 ENCOUNTER — Ambulatory Visit (INDEPENDENT_AMBULATORY_CARE_PROVIDER_SITE_OTHER): Payer: Medicare Other | Admitting: Family Medicine

## 2022-11-12 VITALS — BP 111/68 | HR 54 | Temp 98.0°F | Resp 16 | Ht 70.0 in | Wt 231.2 lb

## 2022-11-12 DIAGNOSIS — H9193 Unspecified hearing loss, bilateral: Secondary | ICD-10-CM | POA: Diagnosis not present

## 2022-11-12 DIAGNOSIS — E119 Type 2 diabetes mellitus without complications: Secondary | ICD-10-CM | POA: Insufficient documentation

## 2022-11-12 DIAGNOSIS — E782 Mixed hyperlipidemia: Secondary | ICD-10-CM

## 2022-11-12 DIAGNOSIS — I10 Essential (primary) hypertension: Secondary | ICD-10-CM

## 2022-11-12 NOTE — Assessment & Plan Note (Signed)
Chronic problem  Appears to be more severe than previously  Would like to be re-evaluated for hearing aids again  Referral submitted to ENT

## 2022-11-12 NOTE — Patient Instructions (Signed)
It was a pleasure to see you today!  Thank you for choosing [facility] for your primary care.   Christian [name] was seen for diabetes, cholesterol and blood pressure.   Our plans for today were: Your blood pressure looks great. Please continue your combination blood pressure medication.  We will collect lab work today to check your hemoglobin A1c as well as kidney and liver function and cholesterol levels.   To keep you healthy, please keep in mind the following health maintenance items that you are due for:   Shingrix Vaccine   Diabetes Eye exam  Medicare Annual Wellness Visit    You should return to our clinic in 6 months for diabetes, blood pressure follow up .   Best Wishes,   Dr. Quentin Cornwall

## 2022-11-12 NOTE — Assessment & Plan Note (Signed)
Controlled BP at goal Continue current medications at current doses No medications changes today  CMP ordered  Follow up in 6 months

## 2022-11-12 NOTE — Assessment & Plan Note (Signed)
Last A1c within goal range  Continue metformin 500mg  daily  No medication changes today  Repeat A1c ordered  Urine microalbumin ordered today

## 2022-11-12 NOTE — Progress Notes (Signed)
I,Sha'taria Tyson,acting as a Education administrator for [employer], MD.,have documented all relevant documentation on the behalf of [name], MD,as directed by  [name], MD while in the presence of [name], MD.   Established patient visit   Patient: Christian [name]   DOB: Dec 27, 1950   71 y.o. Male  MRN: [id number] Visit Date: 11/12/2022  Today's healthcare provider: [name], MD   Chief Complaint  Patient presents with   Diabetes   Hypertension   Subjective    HPI   Diabetes Mellitus Type II, Follow-up  Lab Results  Component Value Date   HGBA1C 6.4 (A) 01/12/2022   HGBA1C 6.8 (H) 09/23/2021   HGBA1C 6.4 (A) 04/28/2021   Wt Readings from Last 3 Encounters:  11/12/22 231 lb 3.2 oz (104.9 kg)  07/28/22 234 lb (106.1 kg)  03/23/22 235 lb 3.2 oz (106.7 kg)   Last seen for diabetes 4 months ago.  Management since then includes metformin 545m daily. He reports excellent compliance with treatment. He is not having side effects.  Symptoms: No fatigue No foot ulcerations  yes appetite changes No nausea  No paresthesia of the feet  No polydipsia  No polyuria No visual disturbances   No vomiting     Home blood sugar records:  does not check     Pertinent Labs: Lab Results  Component Value Date   CHOL 136 09/23/2021   HDL 38 (L) 09/23/2021   LDLCALC 74 09/23/2021   TRIG 133 09/23/2021   CHOLHDL 3.6 09/23/2021   Lab Results  Component Value Date   NA 142 09/23/2021   K 4.9 09/23/2021   CREATININE 1.04 09/23/2021   EGFR 76 09/23/2021     ---------------------------------------------------------------------------------------------------  Hypertension, follow-up  BP Readings from Last 3 Encounters:  11/12/22 111/68  07/28/22 110/64  03/23/22 126/77   Wt Readings from Last 3 Encounters:  11/12/22 231 lb 3.2 oz (104.9 kg)  07/28/22 234 lb (106.1 kg)  03/23/22 235 lb 3.2 oz (106.7 kg)      He was last seen for hypertension 4 months ago.  BP at that visit was 110/64. Management since that visit includes continue olmesartan-amlodipine-hctz. 40-10-25mg   He reports excellent compliance with treatment. He is not having side effects. He is following a Regular diet.    Outside blood pressures are ranging from 130s/140s. He reports not checking regularly   Symptoms: No chest pain No chest pressure  No palpitations No syncope  No dyspnea No orthopnea  No paroxysmal nocturnal dyspnea No lower extremity edema   Pertinent labs Lab Results  Component Value Date   CHOL 136 09/23/2021   HDL 38 (L) 09/23/2021   LDLCALC 74 09/23/2021   TRIG 133 09/23/2021   CHOLHDL 3.6 09/23/2021   Lab Results  Component Value Date   NA 142 09/23/2021   K 4.9 09/23/2021   CREATININE 1.04 09/23/2021   EGFR 76 09/23/2021   GLUCOSE 134 (H) 09/23/2021   TSH 1.350 09/23/2021     The 10-year ASCVD risk score (Arnett DK, et al., 2019) is: 29.7%  --------------------------------------------------------------------------------------------------- Health Maintenance  Declines COVID vaccine today   Medications: Outpatient Medications Prior to Visit  Medication Sig   albuterol (VENTOLIN HFA) 108 (90 Base) MCG/ACT inhaler Inhale 2 puffs into the lungs every 4 (four) hours as needed for wheezing or shortness of breath.   aspirin 81 MG tablet Take 81 mg by mouth daily.    atorvastatin (LIPITOR) 40 MG tablet TAKE 1 TABLET(40  MG) BY MOUTH AT BEDTIME   Budeson-Glycopyrrol-Formoterol (BREZTRI AEROSPHERE) 160-9-4.8 MCG/ACT AERO Inhale 2 puffs into the lungs 2 (two) times daily.   cholecalciferol (VITAMIN D) 1000 UNITS tablet Take 1,000 Units by mouth daily.    glucose blood (CONTOUR NEXT TEST) test strip 1 each by Other route daily. Use as instructed   metFORMIN (GLUCOPHAGE) 500 MG tablet TAKE 1 TABLET(500 MG) BY MOUTH DAILY WITH BREAKFAST   MICROLET LANCETS MISC 1 each by Does not apply route daily.    Olmesartan-amLODIPine-HCTZ 40-10-25 MG TABS TAKE 1 TABLET BY MOUTH DAILY   [DISCONTINUED] BREO ELLIPTA 100-25 MCG/INH AEPB INHALE 1 PUFF INTO THE LUNGS DAILY   [DISCONTINUED] fluticasone furoate-vilanterol (BREO ELLIPTA) 100-25 MCG/ACT AEPB Inhale 1 puff into the lungs daily.   [DISCONTINUED] naproxen (NAPROSYN) 500 MG tablet Take 500 mg by mouth 2 (two) times daily.   No facility-administered medications prior to visit.    Review of Systems  Last lipids Lab Results  Component Value Date   CHOL 136 09/23/2021   HDL 38 (L) 09/23/2021   LDLCALC 74 09/23/2021   TRIG 133 09/23/2021   CHOLHDL 3.6 09/23/2021       Objective    BP 111/68 (BP Location: Left Arm, Patient Position: Sitting, Cuff Size: Large)   Pulse (!) 54   Temp 98 F (36.7 C) (Oral)   Resp 16   Ht _0  (1.778 m)   Wt 231 lb 3.2 oz (104.9 kg)   BMI 33.17 kg/m    Physical Exam Vitals reviewed.  Constitutional:      General: He is not in acute distress.    Appearance: Normal appearance. He is not ill-appearing, toxic-appearing or diaphoretic.  Eyes:     Conjunctiva/sclera: Conjunctivae normal.  Cardiovascular:     Rate and Rhythm: Normal rate and regular rhythm.     Pulses: Normal pulses.     Heart sounds: Normal heart sounds. No murmur heard.    No friction rub. No gallop.  Pulmonary:     Effort: Pulmonary effort is normal. No respiratory distress.     Breath sounds: Normal breath sounds. No stridor. No wheezing, rhonchi or rales.  Abdominal:     General: Bowel sounds are normal. There is no distension.     Palpations: Abdomen is soft.     Tenderness: There is no abdominal tenderness.  Musculoskeletal:     Right lower leg: No edema.     Left lower leg: No edema.  Skin:    Findings: No erythema or rash.  Neurological:     Mental Status: He is alert and oriented to person, place, and time.      No results found for any visits on 11/12/22.  Assessment & Plan     Problem List Items Addressed  This Visit       Cardiovascular and Mediastinum   Essential hypertension    Controlled BP at goal Continue current medications at current doses No medications changes today  CMP ordered  Follow up in 6 months       Relevant Orders   Comprehensive metabolic panel     Endocrine   Diabetes mellitus without complication (HCC) - Primary    Last A1c within goal range  Continue metformin 567m daily  No medication changes today  Repeat A1c ordered  Urine microalbumin ordered today       Relevant Orders   Hemoglobin A1c   Microalbumin / creatinine urine ratio     Other   HLD (hyperlipidemia)  Stable  Previous lipid panel was within goal range  Will recheck lipid panel today  He will continue atorvastatin 62m daily        Relevant Orders   Lipid panel   Decreased hearing of both ears    Chronic problem  Appears to be more severe than previously  Would like to be re-evaluated for hearing aids again  Referral submitted to ENT        Relevant Orders   Ambulatory referral to ENT     Return in about 6 months (around 05/13/2023) for DM, HTN.     I, MEulis Foster MD, have reviewed all documentation for this visit.  Portions of this information were initially documented by the CMA and reviewed by me for thoroughness and accuracy.    MEulis Foster MD  [phone](phone) [phone](fax)  [place]

## 2022-11-12 NOTE — Assessment & Plan Note (Addendum)
Stable  Previous lipid panel was within goal range  Will recheck lipid panel today  He will continue atorvastatin 40mg  daily

## 2022-11-13 LAB — COMPREHENSIVE METABOLIC PANEL
ALT: 78 IU/L — ABNORMAL HIGH (ref 0–44)
AST: 44 IU/L — ABNORMAL HIGH (ref 0–40)
Albumin/Globulin Ratio: 2.2 (ref 1.2–2.2)
Albumin: 5 g/dL — ABNORMAL HIGH (ref 3.8–4.8)
Alkaline Phosphatase: 100 IU/L (ref 44–121)
BUN/Creatinine Ratio: 11 (ref 10–24)
BUN: 14 mg/dL (ref 8–27)
Bilirubin Total: 0.8 mg/dL (ref 0.0–1.2)
CO2: 26 mmol/L (ref 20–29)
Calcium: 10.3 mg/dL — ABNORMAL HIGH (ref 8.6–10.2)
Chloride: 96 mmol/L (ref 96–106)
Creatinine, Ser: 1.24 mg/dL (ref 0.76–1.27)
Globulin, Total: 2.3 g/dL (ref 1.5–4.5)
Glucose: 251 mg/dL — ABNORMAL HIGH (ref 70–99)
Potassium: 4.8 mmol/L (ref 3.5–5.2)
Sodium: 137 mmol/L (ref 134–144)
Total Protein: 7.3 g/dL (ref 6.0–8.5)
eGFR: 61 mL/min/{1.73_m2} (ref >59)

## 2022-11-13 LAB — LIPID PANEL
Chol/HDL Ratio: 3.5 ratio (ref 0.0–5.0)
Cholesterol, Total: 131 mg/dL (ref 100–199)
HDL: 37 mg/dL — ABNORMAL LOW (ref >39)
LDL Chol Calc (NIH): 71 mg/dL (ref 0–99)
Triglycerides: 130 mg/dL (ref 0–149)
VLDL Cholesterol Cal: 23 mg/dL (ref 5–40)

## 2022-11-13 LAB — MICROALBUMIN / CREATININE URINE RATIO
Creatinine, Urine: 202.3 mg/dL
Microalb/Creat Ratio: 21 mg/g creat (ref 0–29)
Microalbumin, Urine: 42.4 ug/mL

## 2022-11-13 LAB — HEMOGLOBIN A1C
Est. average glucose Bld gHb Est-mCnc: 226 mg/dL
Hgb A1c MFr Bld: 9.5 % — ABNORMAL HIGH (ref 4.8–5.6)

## 2022-11-17 DIAGNOSIS — H2513 Age-related nuclear cataract, bilateral: Secondary | ICD-10-CM | POA: Diagnosis not present

## 2022-11-17 DIAGNOSIS — H40003 Preglaucoma, unspecified, bilateral: Secondary | ICD-10-CM | POA: Diagnosis not present

## 2022-11-17 DIAGNOSIS — M3501 Sicca syndrome with keratoconjunctivitis: Secondary | ICD-10-CM | POA: Diagnosis not present

## 2022-11-17 DIAGNOSIS — D3131 Benign neoplasm of right choroid: Secondary | ICD-10-CM | POA: Diagnosis not present

## 2022-12-03 ENCOUNTER — Telehealth: Payer: Self-pay | Admitting: Family Medicine

## 2022-12-03 NOTE — Telephone Encounter (Signed)
LVM for pt to rtn my call to schedule AWV with [facility] call back # [phone]. Please schedule this appt if patient calls the office.

## 2022-12-11 DIAGNOSIS — M3501 Sicca syndrome with keratoconjunctivitis: Secondary | ICD-10-CM | POA: Diagnosis not present

## 2022-12-11 DIAGNOSIS — H40003 Preglaucoma, unspecified, bilateral: Secondary | ICD-10-CM | POA: Diagnosis not present

## 2022-12-23 NOTE — Progress Notes (Signed)
I,[name],acting as a scribe for [employer], MD.,have documented all relevant documentation on the behalf of Christian Foster, MD,as directed by  Christian Foster, MD while in the presence of Christian Foster, MD.   Established patient visit   Patient: Christian [name]   DOB: 17-Mar-1951   71 y.o. Male  MRN: [id number] Visit Date: 12/24/2022  Today's healthcare provider: [name], MD   Chief Complaint  Patient presents with   Follow-Up uncontrolled DM   Subjective    HPI  Diabetes Mellitus Type II, Follow-up  Lab Results  Component Value Date   HGBA1C 9.5 (H) 11/12/2022   HGBA1C 6.4 (A) 01/12/2022   HGBA1C 6.8 (H) 09/23/2021   Wt Readings from Last 3 Encounters:  12/24/22 227 lb 9.6 oz (103.2 kg)  11/12/22 231 lb 3.2 oz (104.9 kg)  07/28/22 234 lb (106.1 kg)   Last seen for diabetes 6 weeks ago.  Management since then includes metformin 528m daily  He reports excellent compliance with treatment.  Home blood sugar records:  157-227 in AM  Episodes of hypoglycemia? No    Current insulin regiment: none   Pertinent Labs: Lab Results  Component Value Date   CHOL 131 11/12/2022   HDL 37 (L) 11/12/2022   LDLCALC 71 11/12/2022   TRIG 130 11/12/2022   CHOLHDL 3.5 11/12/2022   Lab Results  Component Value Date   NA 140 12/24/2022   K 5.0 12/24/2022   CREATININE 1.11 12/24/2022   EGFR 70 12/24/2022   LABMICR 42.4 11/12/2022   MICRALBCREAT 21 11/12/2022     ---------------------------------------------------------------------------------------------------   Medications: Outpatient Medications Prior to Visit  Medication Sig   albuterol (VENTOLIN HFA) 108 (90 Base) MCG/ACT inhaler Inhale 2 puffs into the lungs every 4 (four) hours as needed for wheezing or shortness of breath.   aspirin 81 MG tablet Take 81 mg by mouth daily.    atorvastatin (LIPITOR) 40 MG tablet TAKE 1 TABLET(40 MG) BY MOUTH AT  BEDTIME   Budeson-Glycopyrrol-Formoterol (BREZTRI AEROSPHERE) 160-9-4.8 MCG/ACT AERO Inhale 2 puffs into the lungs 2 (two) times daily.   cholecalciferol (VITAMIN D) 1000 UNITS tablet Take 1,000 Units by mouth daily.    glucose blood (CONTOUR NEXT TEST) test strip 1 each by Other route daily. Use as instructed   MICROLET LANCETS MISC 1 each by Does not apply route daily.   Olmesartan-amLODIPine-HCTZ 40-10-25 MG TABS TAKE 1 TABLET BY MOUTH DAILY   [DISCONTINUED] metFORMIN (GLUCOPHAGE) 500 MG tablet TAKE 1 TABLET(500 MG) BY MOUTH DAILY WITH BREAKFAST   No facility-administered medications prior to visit.    Review of Systems  Constitutional:  Positive for fatigue.  Respiratory:  Positive for chest tightness and shortness of breath.   Cardiovascular:  Positive for chest pain.  Neurological:  Positive for dizziness, weakness and light-headedness.       Objective    BP 120/69 (BP Location: Right Arm, Patient Position: Sitting, Cuff Size: Normal)   Pulse (!) 53   Resp 16   Wt 227 lb 9.6 oz (103.2 kg)   BMI 32.66 kg/m    Physical Exam Vitals reviewed.  Constitutional:      General: He is not in acute distress.    Appearance: Normal appearance. He is not ill-appearing, toxic-appearing or diaphoretic.  Eyes:     Conjunctiva/sclera: Conjunctivae normal.  Cardiovascular:     Rate and Rhythm: Normal rate and regular rhythm.     Pulses: Normal pulses.     Heart sounds:  Normal heart sounds. No murmur heard.    No friction rub. No gallop.  Pulmonary:     Effort: Pulmonary effort is normal. No respiratory distress.     Breath sounds: Normal breath sounds. No stridor. No wheezing, rhonchi or rales.  Abdominal:     General: Bowel sounds are normal. There is no distension.     Palpations: Abdomen is soft.     Tenderness: There is no abdominal tenderness.  Musculoskeletal:     Right lower leg: No edema.     Left lower leg: No edema.  Skin:    Findings: No erythema or rash.   Neurological:     Mental Status: He is alert and oriented to person, place, and time.      Results for orders placed or performed in visit on 12/24/22  Comprehensive metabolic panel  Result Value Ref Range   Glucose 157 (H) 70 - 99 mg/dL   BUN 21 8 - 27 mg/dL   Creatinine, Ser 1.11 0.76 - 1.27 mg/dL   eGFR 70 >59 mL/min/1.73   BUN/Creatinine Ratio 19 10 - 24   Sodium 140 134 - 144 mmol/L   Potassium 5.0 3.5 - 5.2 mmol/L   Chloride 101 96 - 106 mmol/L   CO2 24 20 - 29 mmol/L   Calcium 10.1 8.6 - 10.2 mg/dL   Total Protein 7.1 6.0 - 8.5 g/dL   Albumin 4.8 3.8 - 4.8 g/dL   Globulin, Total 2.3 1.5 - 4.5 g/dL   Albumin/Globulin Ratio 2.1 1.2 - 2.2   Bilirubin Total 0.7 0.0 - 1.2 mg/dL   Alkaline Phosphatase 83 44 - 121 IU/L   AST 31 0 - 40 IU/L   ALT 58 (H) 0 - 44 IU/L  Parathyroid hormone, intact (no Ca)  Result Value Ref Range   PTH 22 15 - 65 pg/mL    Assessment & Plan     Problem List Items Addressed This Visit       Endocrine   Diabetes mellitus without complication (HCC) - Primary    Chronic  A1c was increased 1 month ago  Adding Mounjaro 2.5 mg weekly and will follow up in 4 weeks  AVS given with nutrition recommendations for diabetes management Will also increase metformin to 1015m daily        Relevant Medications   tirzepatide (MOUNJARO) 2.5 MG/0.5ML Pen   metFORMIN (GLUCOPHAGE) 500 MG tablet     Other   Hypercalcemia    Previous calcium level was elevated at 10.3  Will recheck CMP today along with PTH       Relevant Orders   Comprehensive metabolic panel (Completed)   Parathyroid hormone, intact (no Ca) (Completed)     Return in about 1 month (around 01/22/2023) for diabetes medications.        The entirety of the information documented in the History of Present Illness, Review of Systems and Physical Exam were personally obtained by me. Portions of this information were initially documented by JLyndel Pleasure CMA and reviewed by me for  thoroughness and accuracy.MEulis Foster MD     MEulis Foster MD  [phone](phone) [phone](fax)  [place]

## 2022-12-24 ENCOUNTER — Encounter: Payer: Self-pay | Admitting: Family Medicine

## 2022-12-24 ENCOUNTER — Ambulatory Visit (INDEPENDENT_AMBULATORY_CARE_PROVIDER_SITE_OTHER): Payer: Medicare Other | Admitting: Family Medicine

## 2022-12-24 VITALS — BP 120/69 | HR 53 | Resp 16 | Wt 227.6 lb

## 2022-12-24 DIAGNOSIS — E1165 Type 2 diabetes mellitus with hyperglycemia: Secondary | ICD-10-CM | POA: Diagnosis not present

## 2022-12-24 MED ORDER — METFORMIN HCL 500 MG PO TABS: 1000.0000 mg | ORAL_TABLET | Freq: Every day | ORAL | 1 refills | Status: DC

## 2022-12-24 MED ORDER — TIRZEPATIDE 2.5 MG/0.5ML ~~LOC~~ SOAJ: 2.5000 mg | SUBCUTANEOUS | 1 refills | Status: DC

## 2022-12-24 NOTE — Assessment & Plan Note (Signed)
Chronic  A1c was increased 1 month ago  Adding Mounjaro 2.5 mg weekly and will follow up in 4 weeks  AVS given with nutrition recommendations for diabetes management Will also increase metformin to 1013m daily

## 2022-12-24 NOTE — Patient Instructions (Addendum)
Please increase metformin to 1068m daily   I will send in a prescription to start Mounjaro to help with reaching your A1c goal.   Please follow up with me in 1 month to see how you are tolerating your medication adjustments.    Diabetes Mellitus and Nutrition, Adult When you have diabetes, or diabetes mellitus, it is very important to have healthy eating habits because your blood sugar (glucose) levels are greatly affected by what you eat and drink. Eating healthy foods in the right amounts, at about the same times every day, can help you: Manage your blood glucose. Lower your risk of heart disease. Improve your blood pressure. Reach or maintain a healthy weight. What can affect my meal plan? Every person with diabetes is different, and each person has different needs for a meal plan. Your health care provider may recommend that you work with a dietitian to make a meal plan that is best for you. Your meal plan may vary depending on factors such as: The calories you need. The medicines you take. Your weight. Your blood glucose, blood pressure, and cholesterol levels. Your activity level. Other health conditions you have, such as heart or kidney disease. How do carbohydrates affect me? Carbohydrates, also called carbs, affect your blood glucose level more than any other type of food. Eating carbs raises the amount of glucose in your blood. It is important to know how many carbs you can safely have in each meal. This is different for every person. Your dietitian can help you calculate how many carbs you should have at each meal and for each snack. How does alcohol affect me? Alcohol can cause a decrease in blood glucose (hypoglycemia), especially if you use insulin or take certain diabetes medicines by mouth. Hypoglycemia can be a life-threatening condition. Symptoms of hypoglycemia, such as sleepiness, dizziness, and confusion, are similar to symptoms of having too much alcohol. Do not  drink alcohol if: Your health care provider tells you not to drink. You are pregnant, may be pregnant, or are planning to become pregnant. If you drink alcohol: Limit how much you have to: 0-1 drink a day for women. 0-2 drinks a day for men. Know how much alcohol is in your drink. In the [place] one drink equals one 12 oz bottle of beer (355 mL), one 5 oz glass of wine (148 mL), or one 1 oz glass of hard liquor (44 mL). Keep yourself hydrated with water, diet soda, or unsweetened iced tea. Keep in mind that regular soda, juice, and other mixers may contain a lot of sugar and must be counted as carbs. What are tips for following this plan?  Reading food labels Start by checking the serving size on the Nutrition Facts label of packaged foods and drinks. The number of calories and the amount of carbs, fats, and other nutrients listed on the label are based on one serving of the item. Many items contain more than one serving per package. Check the total grams (g) of carbs in one serving. Check the number of grams of saturated fats and trans fats in one serving. Choose foods that have a low amount or none of these fats. Check the number of milligrams (mg) of salt (sodium) in one serving. Most people should limit total sodium intake to less than 2,300 mg per day. Always check the nutrition information of foods labeled as "low-fat" or "nonfat." These foods may be higher in added sugar or refined carbs and should be avoided. Talk to  your dietitian to identify your daily goals for nutrients listed on the label. Shopping Avoid buying canned, pre-made, or processed foods. These foods tend to be high in fat, sodium, and added sugar. Shop around the outside edge of the grocery store. This is where you will most often find fresh fruits and vegetables, bulk grains, fresh meats, and fresh dairy products. Cooking Use low-heat cooking methods, such as baking, instead of high-heat cooking methods, such as deep  frying. Cook using healthy oils, such as olive, canola, or sunflower oil. Avoid cooking with butter, cream, or high-fat meats. Meal planning Eat meals and snacks regularly, preferably at the same times every day. Avoid going long periods of time without eating. Eat foods that are high in fiber, such as fresh fruits, vegetables, beans, and whole grains. Eat 4-6 oz (112-168 g) of lean protein each day, such as lean meat, chicken, fish, eggs, or tofu. One ounce (oz) (28 g) of lean protein is equal to: 1 oz (28 g) of meat, chicken, or fish. 1 egg.  cup (62 g) of tofu. Eat some foods each day that contain healthy fats, such as avocado, nuts, seeds, and fish. What foods should I eat? Fruits Berries. Apples. Oranges. Peaches. Apricots. Plums. Grapes. Mangoes. Papayas. Pomegranates. Kiwi. Cherries. Vegetables Leafy greens, including lettuce, spinach, kale, chard, collard greens, mustard greens, and cabbage. Beets. Cauliflower. Broccoli. Carrots. Green beans. Tomatoes. Peppers. Onions. Cucumbers. Brussels sprouts. Grains Whole grains, such as whole-wheat or whole-grain bread, crackers, tortillas, cereal, and pasta. Unsweetened oatmeal. Quinoa. Brown or wild rice. Meats and other proteins Seafood. Poultry without skin. Lean cuts of poultry and beef. Tofu. Nuts. Seeds. Dairy Low-fat or fat-free dairy products such as milk, yogurt, and cheese. The items listed above may not be a complete list of foods and beverages you can eat and drink. Contact a dietitian for more information. What foods should I avoid? Fruits Fruits canned with syrup. Vegetables Canned vegetables. Frozen vegetables with butter or cream sauce. Grains Refined white flour and flour products such as bread, pasta, snack foods, and cereals. Avoid all processed foods. Meats and other proteins Fatty cuts of meat. Poultry with skin. Breaded or fried meats. Processed meat. Avoid saturated fats. Dairy Full-fat yogurt, cheese, or  milk. Beverages Sweetened drinks, such as soda or iced tea. The items listed above may not be a complete list of foods and beverages you should avoid. Contact a dietitian for more information. Questions to ask a health care provider Do I need to meet with a certified diabetes care and education specialist? Do I need to meet with a dietitian? What number can I call if I have questions? When are the best times to check my blood glucose? Where to find more information: American Diabetes Association: diabetes.org Academy of Nutrition and Dietetics: eatright.[employer] of Diabetes and Digestive and Kidney Diseases: [employer] Association of Diabetes Care & Education Specialists: diabeteseducator.org Summary It is important to have healthy eating habits because your blood sugar (glucose) levels are greatly affected by what you eat and drink. It is important to use alcohol carefully. A healthy meal plan will help you manage your blood glucose and lower your risk of heart disease. Your health care provider may recommend that you work with a dietitian to make a meal plan that is best for you. This information is not intended to replace advice given to you by your health care provider. Make sure you discuss any questions you have with your health care provider. Document Revised: 05/29/2020  Document Reviewed: 05/29/2020 Elsevier Patient Education  [place].

## 2022-12-25 LAB — COMPREHENSIVE METABOLIC PANEL
ALT: 58 IU/L — ABNORMAL HIGH (ref 0–44)
AST: 31 IU/L (ref 0–40)
Albumin/Globulin Ratio: 2.1 (ref 1.2–2.2)
Albumin: 4.8 g/dL (ref 3.8–4.8)
Alkaline Phosphatase: 83 IU/L (ref 44–121)
BUN/Creatinine Ratio: 19 (ref 10–24)
BUN: 21 mg/dL (ref 8–27)
Bilirubin Total: 0.7 mg/dL (ref 0.0–1.2)
CO2: 24 mmol/L (ref 20–29)
Calcium: 10.1 mg/dL (ref 8.6–10.2)
Chloride: 101 mmol/L (ref 96–106)
Creatinine, Ser: 1.11 mg/dL (ref 0.76–1.27)
Globulin, Total: 2.3 g/dL (ref 1.5–4.5)
Glucose: 157 mg/dL — ABNORMAL HIGH (ref 70–99)
Potassium: 5 mmol/L (ref 3.5–5.2)
Sodium: 140 mmol/L (ref 134–144)
Total Protein: 7.1 g/dL (ref 6.0–8.5)
eGFR: 70 mL/min/{1.73_m2} (ref >59)

## 2022-12-25 LAB — PARATHYROID HORMONE, INTACT (NO CA): PTH: 22 pg/mL (ref 15–65)

## 2022-12-28 NOTE — Assessment & Plan Note (Signed)
Previous calcium level was elevated at 10.3  Will recheck CMP today along with PTH

## 2023-01-03 DIAGNOSIS — M79675 Pain in left toe(s): Secondary | ICD-10-CM | POA: Diagnosis not present

## 2023-01-12 ENCOUNTER — Encounter: Payer: Self-pay | Admitting: Family Medicine

## 2023-01-25 DIAGNOSIS — E1165 Type 2 diabetes mellitus with hyperglycemia: Secondary | ICD-10-CM | POA: Insufficient documentation

## 2023-01-25 NOTE — Progress Notes (Unsigned)
I,[name],acting as a scribe for [employer], MD.,have documented all relevant documentation on the behalf of [name], MD,as directed by  [name], MD while in the presence of [name], MD.   Established patient visit   Patient: Christian [name]   DOB: [birth year]   71 y.o. Male  MRN: [id number] Visit Date: 01/26/2023  Today's healthcare provider: [name], MD   Chief Complaint  Patient presents with   follow-up DM   Subjective    HPI  Diabetes Mellitus Type II, Follow-up  Lab Results  Component Value Date   HGBA1C 7.1 (A) 01/26/2023   HGBA1C 9.5 (H) 11/12/2022   HGBA1C 6.4 (A) 01/12/2022   Wt Readings from Last 3 Encounters:  01/26/23 214 lb (97.1 kg)  12/24/22 227 lb 9.6 oz (103.2 kg)  11/12/22 231 lb 3.2 oz (104.9 kg)   Last seen for diabetes 4 weeks ago.  Management since then includes starting Mounjaro 2.5 mg weekly and metformin 100mg  twice daily. He reports excellent compliance with treatment. He is having side effects.   Symptoms: Yes fatigue No foot ulcerations  Yes appetite changes No nausea  No paresthesia of the feet  Yes- polydipsia-  No polyuria No visual disturbances   No vomiting     Home blood sugar records: fasting range: 87-110's  This morning was 110  Episodes of hypoglycemia? No    Current insulin regiment: none Most Recent Eye Exam: 2 weeks   Pertinent Labs: Lab Results  Component Value Date   CHOL 131 11/12/2022   HDL 37 (L) 11/12/2022   LDLCALC 71 11/12/2022   TRIG 130 11/12/2022   CHOLHDL 3.5 11/12/2022   Lab Results  Component Value Date   NA 140 12/24/2022   K 5.0 12/24/2022   CREATININE 1.11 12/24/2022   EGFR 70 12/24/2022   LABMICR 42.4 11/12/2022   MICRALBCREAT 21 11/12/2022     ---------------------------------------------------------------------------------------------------   Rash on Scalp  Present 5-6 weeks  Sometimes  itchy  Denies bleeding or drainage  Patient reports wearing a hat for sun protection    Medications: Outpatient Medications Prior to Visit  Medication Sig   albuterol (VENTOLIN HFA) 108 (90 Base) MCG/ACT inhaler Inhale 2 puffs into the lungs every 4 (four) hours as needed for wheezing or shortness of breath.   aspirin 81 MG tablet Take 81 mg by mouth daily.    atorvastatin (LIPITOR) 40 MG tablet TAKE 1 TABLET(40 MG) BY MOUTH AT BEDTIME   Budeson-Glycopyrrol-Formoterol (BREZTRI AEROSPHERE) 160-9-4.8 MCG/ACT AERO Inhale 2 puffs into the lungs 2 (two) times daily.   cholecalciferol (VITAMIN D) 1000 UNITS tablet Take 1,000 Units by mouth daily.    glucose blood (CONTOUR NEXT TEST) test strip 1 each by Other route daily. Use as instructed   metFORMIN (GLUCOPHAGE) 500 MG tablet Take 2 tablets (1,000 mg total) by mouth daily with breakfast.   MICROLET LANCETS MISC 1 each by Does not apply route daily.   Olmesartan-amLODIPine-HCTZ 40-10-25 MG TABS TAKE 1 TABLET BY MOUTH DAILY   [DISCONTINUED] tirzepatide (MOUNJARO) 2.5 MG/0.5ML Pen Inject 2.5 mg into the skin once a week.   No facility-administered medications prior to visit.    Review of Systems     Objective    BP 99/63 (BP Location: Right Arm, Patient Position: Sitting, Cuff Size: Normal)   Pulse (!) 52   Resp 16   Ht 5\' 10"  (1.778 m)   Wt 214 lb (97.1 kg)   BMI 30.71  kg/m    Physical Exam Vitals reviewed.  Constitutional:      General: He is not in acute distress.    Appearance: Normal appearance. He is not ill-appearing, toxic-appearing or diaphoretic.  Eyes:     Conjunctiva/sclera: Conjunctivae normal.  Neck:     Thyroid: No thyroid mass, thyromegaly or thyroid tenderness.     Vascular: No carotid bruit.  Cardiovascular:     Rate and Rhythm: Normal rate and regular rhythm.     Pulses: Normal pulses.     Heart sounds:     No friction rub. No gallop.  Pulmonary:     Effort: Pulmonary effort is normal. No respiratory  distress.     Breath sounds: Normal breath sounds. No stridor. No wheezing, rhonchi or rales.  Abdominal:     General: Bowel sounds are normal. There is no distension.     Palpations: Abdomen is soft.     Tenderness: There is no abdominal tenderness.  Musculoskeletal:     Right lower leg: No edema.     Left lower leg: No edema.  Lymphadenopathy:     Cervical: No cervical adenopathy.  Skin:    Findings: No erythema or rash.  Neurological:     Mental Status: He is alert and oriented to person, place, and time.       Results for orders placed or performed in visit on 01/26/23  POCT glycosylated hemoglobin (Hb A1C)  Result Value Ref Range   Hemoglobin A1C 7.1 (A) 4.0 - 5.6 %   Est. average glucose Bld gHb Est-mCnc 157     Assessment & Plan     Problem List Items Addressed This Visit       Endocrine   Type 2 diabetes mellitus with hyperglycemia, without long-term current use of insulin (HCC) - Primary    Chronic  Improved A1c decreased from 9.5 to 7.1 on Mounjaro 2.5  Patient has persistent nausea on mounjaro, down 13 pounds in 1 month We will DC mounjaro and add jardiance 10mg  daily  Continue metformin 1000mg  BID Patient will follow up in 1 month for diabetes medication management  Patient given AVS with nutrition recommendations       Relevant Medications   empagliflozin (JARDIANCE) 10 MG TABS tablet   Other Relevant Orders   POCT glycosylated hemoglobin (Hb A1C) (Completed)     Musculoskeletal and Integument   Lesion of skin of scalp    Has been present for ~6 weeks  Likely Actinic keratosis  Recommended dermatology referral, referral submitted today  Recommended continued sun protection of the scalp and applying moisturizer      Relevant Orders   Ambulatory referral to Dermatology     Return in about 1 month (around 02/26/2023) for DM medication .        The entirety of the information documented in the History of Present Illness, Review of Systems and  Physical Exam were personally obtained by me. Portions of this information were initially documented by Lyndel Pleasure, CMA . I, [name], MD have reviewed the documentation above for thoroughness and accuracy.      [name], MD  [facility] [phone] (phone) [phone] (fax)  [place]

## 2023-01-26 ENCOUNTER — Encounter: Payer: Self-pay | Admitting: Family Medicine

## 2023-01-26 ENCOUNTER — Ambulatory Visit (INDEPENDENT_AMBULATORY_CARE_PROVIDER_SITE_OTHER): Payer: Medicare Other | Admitting: Family Medicine

## 2023-01-26 VITALS — BP 99/63 | HR 52 | Resp 16 | Ht 70.0 in | Wt 214.0 lb

## 2023-01-26 DIAGNOSIS — L989 Disorder of the skin and subcutaneous tissue, unspecified: Secondary | ICD-10-CM | POA: Diagnosis not present

## 2023-01-26 DIAGNOSIS — E1165 Type 2 diabetes mellitus with hyperglycemia: Secondary | ICD-10-CM | POA: Diagnosis not present

## 2023-01-26 LAB — POCT GLYCOSYLATED HEMOGLOBIN (HGB A1C)
Est. average glucose Bld gHb Est-mCnc: 157
Hemoglobin A1C: 7.1 % — AB (ref 4.0–5.6)

## 2023-01-26 MED ORDER — EMPAGLIFLOZIN 10 MG PO TABS: 10.0000 mg | ORAL_TABLET | Freq: Every day | ORAL | 1 refills | Status: DC

## 2023-01-26 NOTE — Assessment & Plan Note (Addendum)
Has been present for ~6 weeks  Likely Actinic keratosis  Recommended dermatology referral, referral submitted today  Recommended continued sun protection of the scalp and applying moisturizer

## 2023-01-26 NOTE — Assessment & Plan Note (Addendum)
Chronic  Improved A1c decreased from 9.5 to 7.1 on Mounjaro 2.5  Patient has persistent nausea on mounjaro, down 13 pounds in 1 month We will DC mounjaro and add jardiance 10mg  daily  Continue metformin 1000mg  BID Patient will follow up in 1 month for diabetes medication management  Patient given AVS with nutrition recommendations

## 2023-01-26 NOTE — Patient Instructions (Addendum)
It was a pleasure to see you today!  Thank you for choosing [facility] for your primary care.   Christian [name] was seen for diabetes.   Our plans for today were: We will discontinue the Mounjaro  I have prescribed Jardiance 10mg  to start taking by mouth daily       To keep you healthy, please keep in mind the following health maintenance items that you are due for:   Diabetes Eye Exam   Schedule Annual Wellness Visit Shingrix Vaccine  COVID booster   You should return to our clinic in 1 month for diabetes follow up  Best Wishes,   Dr. Quentin Cornwall     Diabetes Mellitus and Nutrition, Adult When you have diabetes, or diabetes mellitus, it is very important to have healthy eating habits because your blood sugar (glucose) levels are greatly affected by what you eat and drink. Eating healthy foods in the right amounts, at about the same times every day, can help you: Manage your blood glucose. Lower your risk of heart disease. Improve your blood pressure. Reach or maintain a healthy weight. What can affect my meal plan? Every person with diabetes is different, and each person has different needs for a meal plan. Your health care provider may recommend that you work with a dietitian to make a meal plan that is best for you. Your meal plan may vary depending on factors such as: The calories you need. The medicines you take. Your weight. Your blood glucose, blood pressure, and cholesterol levels. Your activity level. Other health conditions you have, such as heart or kidney disease. How do carbohydrates affect me? Carbohydrates, also called carbs, affect your blood glucose level more than any other type of food. Eating carbs raises the amount of glucose in your blood. It is important to know how many carbs you can safely have in each meal. This is different for every person. Your dietitian can help you calculate how many carbs you should have at each meal and  for each snack. How does alcohol affect me? Alcohol can cause a decrease in blood glucose (hypoglycemia), especially if you use insulin or take certain diabetes medicines by mouth. Hypoglycemia can be a life-threatening condition. Symptoms of hypoglycemia, such as sleepiness, dizziness, and confusion, are similar to symptoms of having too much alcohol. Do not drink alcohol if: Your health care provider tells you not to drink. You are pregnant, may be pregnant, or are planning to become pregnant. If you drink alcohol: Limit how much you have to: 0-1 drink a day for women. 0-2 drinks a day for men. Know how much alcohol is in your drink. In the [place] one drink equals one 12 oz bottle of beer (355 mL), one 5 oz glass of wine (148 mL), or one 1 oz glass of hard liquor (44 mL). Keep yourself hydrated with water, diet soda, or unsweetened iced tea. Keep in mind that regular soda, juice, and other mixers may contain a lot of sugar and must be counted as carbs. What are tips for following this plan?  Reading food labels Start by checking the serving size on the Nutrition Facts label of packaged foods and drinks. The number of calories and the amount of carbs, fats, and other nutrients listed on the label are based on one serving of the item. Many items contain more than one serving per package. Check the total grams (g) of carbs in one serving. Check the number of grams of saturated fats  and trans fats in one serving. Choose foods that have a low amount or none of these fats. Check the number of milligrams (mg) of salt (sodium) in one serving. Most people should limit total sodium intake to less than 2,300 mg per day. Always check the nutrition information of foods labeled as "low-fat" or "nonfat." These foods may be higher in added sugar or refined carbs and should be avoided. Talk to your dietitian to identify your daily goals for nutrients listed on the label. Shopping Avoid buying canned,  pre-made, or processed foods. These foods tend to be high in fat, sodium, and added sugar. Shop around the outside edge of the grocery store. This is where you will most often find fresh fruits and vegetables, bulk grains, fresh meats, and fresh dairy products. Cooking Use low-heat cooking methods, such as baking, instead of high-heat cooking methods, such as deep frying. Cook using healthy oils, such as olive, canola, or sunflower oil. Avoid cooking with butter, cream, or high-fat meats. Meal planning Eat meals and snacks regularly, preferably at the same times every day. Avoid going long periods of time without eating. Eat foods that are high in fiber, such as fresh fruits, vegetables, beans, and whole grains. Eat 4-6 oz (112-168 g) of lean protein each day, such as lean meat, chicken, fish, eggs, or tofu. One ounce (oz) (28 g) of lean protein is equal to: 1 oz (28 g) of meat, chicken, or fish. 1 egg.  cup (62 g) of tofu. Eat some foods each day that contain healthy fats, such as avocado, nuts, seeds, and fish. What foods should I eat? Fruits Berries. Apples. Oranges. Peaches. Apricots. Plums. Grapes. Mangoes. Papayas. Pomegranates. Kiwi. Cherries. Vegetables Leafy greens, including lettuce, spinach, kale, chard, collard greens, mustard greens, and cabbage. Beets. Cauliflower. Broccoli. Carrots. Green beans. Tomatoes. Peppers. Onions. Cucumbers. Brussels sprouts. Grains Whole grains, such as whole-wheat or whole-grain bread, crackers, tortillas, cereal, and pasta. Unsweetened oatmeal. Quinoa. Brown or wild rice. Meats and other proteins Seafood. Poultry without skin. Lean cuts of poultry and beef. Tofu. Nuts. Seeds. Dairy Low-fat or fat-free dairy products such as milk, yogurt, and cheese. The items listed above may not be a complete list of foods and beverages you can eat and drink. Contact a dietitian for more information. What foods should I avoid? Fruits Fruits canned with  syrup. Vegetables Canned vegetables. Frozen vegetables with butter or cream sauce. Grains Refined white flour and flour products such as bread, pasta, snack foods, and cereals. Avoid all processed foods. Meats and other proteins Fatty cuts of meat. Poultry with skin. Breaded or fried meats. Processed meat. Avoid saturated fats. Dairy Full-fat yogurt, cheese, or milk. Beverages Sweetened drinks, such as soda or iced tea. The items listed above may not be a complete list of foods and beverages you should avoid. Contact a dietitian for more information. Questions to ask a health care provider Do I need to meet with a certified diabetes care and education specialist? Do I need to meet with a dietitian? What number can I call if I have questions? When are the best times to check my blood glucose? Where to find more information: American Diabetes Association: diabetes.org Academy of Nutrition and Dietetics: eatright.[employer] of Diabetes and Digestive and Kidney Diseases: [employer]is Association of Diabetes Care & Education Specialists: diabeteseducator.org Summary It is important to have healthy eating habits because your blood sugar (glucose) levels are greatly affected by what you eat and drink. It is important to use alcohol  carefully. A healthy meal plan will help you manage your blood glucose and lower your risk of heart disease. Your health care provider may recommend that you work with a dietitian to make a meal plan that is best for you. This information is not intended to replace advice given to you by your health care provider. Make sure you discuss any questions you have with your health care provider. Document Revised: 05/29/2020 Document Reviewed: 05/29/2020 Elsevier Patient Education  [place].

## 2023-02-23 ENCOUNTER — Telehealth: Payer: Self-pay | Admitting: Family Medicine

## 2023-02-23 ENCOUNTER — Other Ambulatory Visit: Payer: Self-pay | Admitting: Family Medicine

## 2023-02-23 DIAGNOSIS — I10 Essential (primary) hypertension: Secondary | ICD-10-CM

## 2023-02-23 MED ORDER — OLMESARTAN-AMLODIPINE-HCTZ 40-10-25 MG PO TABS
1.0000 | ORAL_TABLET | Freq: Every day | ORAL | 3 refills | Status: DC
Start: 2023-02-23 — End: 2024-02-17

## 2023-02-23 MED ORDER — ATORVASTATIN CALCIUM 40 MG PO TABS: ORAL_TABLET | ORAL | 3 refills | Status: DC

## 2023-02-23 NOTE — Telephone Encounter (Signed)
Medication Refill - Medication: atorvastatin (LIPITOR) 40 MG tablet    Olmesartan-amLODIPine-HCTZ 40-10-25 MG TABS  Has the patient contacted their pharmacy? Yes.     Pharmacy originally sent prescription request to Dr. Sullivan Lone office and advised pt wife to call PCP to have new prescriptions sent to them.   Preferred Pharmacy (with phone number or street name):  [id number] [place], [place] - [address] AT [facility] [address] Phone: [phone]  Fax: [phone]     Has the patient been seen for an appointment in the last year OR does the patient have an upcoming appointment? Yes.    Agent: Please be advised that RX refills may take up to 3 business days. We ask that you follow-up with your pharmacy.

## 2023-02-23 NOTE — Telephone Encounter (Signed)
[facility] pharmacy faxed refill request for the following medications:   Olmesartan-amLODIPine-HCTZ 40-10-25 MG TABS    Please advise

## 2023-02-23 NOTE — Telephone Encounter (Signed)
[facility] pharmacy faxed refill request for the following medications:   atorvastatin (LIPITOR) 40 MG tablet    Please advise  

## 2023-02-25 NOTE — Progress Notes (Signed)
I,[name],acting as a scribe for [employer], MD.,have documented all relevant documentation on the behalf of Ronnald Ramp, MD,as directed by  Ronnald Ramp, MD while in the presence of Ronnald Ramp, MD.   Established patient visit   Patient: Christian [name]   DOB: January 07, 1951   72 y.o. Male  MRN: [id number] Visit Date: 02/26/2023  Today's healthcare provider: Ronnald Ramp, MD   Chief Complaint  Patient presents with   Follow-up   Subjective    HPI  Diabetes Mellitus Type II, Follow-up  Lab Results  Component Value Date   HGBA1C 7.1 (A) 01/26/2023   HGBA1C 9.5 (H) 11/12/2022   HGBA1C 6.4 (A) 01/12/2022   Wt Readings from Last 3 Encounters:  02/26/23 210 lb 12.8 oz (95.6 kg)  01/26/23 214 lb (97.1 kg)  12/24/22 227 lb 9.6 oz (103.2 kg)   Last seen for diabetes 1 months ago.  Management since then includes add jardiance  daily  Continue metformin  BID. He reports excellent compliance with treatment.  He is not having side effects.   Symptoms: No fatigue No foot ulcerations  No appetite changes No nausea  No paresthesia of the feet  No polydipsia  No polyuria No visual disturbances   No vomiting     Home blood sugar records: fasting range: 01/2023-100's-116  02/2023-127-131 doesn't check everyday. These are fasting levels   Episodes of hypoglycemia? No    Pertinent Labs: Lab Results  Component Value Date   CHOL 131 11/12/2022   HDL 37 (L) 11/12/2022   LDLCALC 71 11/12/2022   TRIG 130 11/12/2022   CHOLHDL 3.5 11/12/2022   Lab Results  Component Value Date   NA 140 12/24/2022   K 5.0 12/24/2022   CREATININE 1.11 12/24/2022   EGFR 70 12/24/2022   LABMICR 42.4 11/12/2022   MICRALBCREAT 21 11/12/2022     ---------------------------------------------------------------------------------------------------   Medications: Outpatient Medications Prior to Visit  Medication Sig    albuterol (VENTOLIN HFA) 108 (90 Base) MCG/ACT inhaler Inhale 2 puffs into the lungs every 4 (four) hours as needed for wheezing or shortness of breath.   aspirin 81 MG tablet Take 81 mg by mouth daily.    atorvastatin (LIPITOR) 40 MG tablet TAKE 1 TABLET(40 MG) BY MOUTH AT BEDTIME   Budeson-Glycopyrrol-Formoterol (BREZTRI AEROSPHERE) 160-9-4.8 MCG/ACT AERO Inhale 2 puffs into the lungs 2 (two) times daily.   cholecalciferol (VITAMIN D) 1000 UNITS tablet Take 1,000 Units by mouth daily.    empagliflozin (JARDIANCE) 10 MG TABS tablet Take 1 tablet (10 mg total) by mouth daily before breakfast.   glucose blood (CONTOUR NEXT TEST) test strip 1 each by Other route daily. Use as instructed   metFORMIN (GLUCOPHAGE) 500 MG tablet Take 2 tablets (1,000 mg total) by mouth daily with breakfast.   MICROLET LANCETS MISC 1 each by Does not apply route daily.   Olmesartan-amLODIPine-HCTZ 40-10-25 MG TABS Take 1 tablet by mouth daily.   No facility-administered medications prior to visit.    Review of Systems     Objective    BP 113/60 (BP Location: Left Arm, Patient Position: Sitting, Cuff Size: Large)   Pulse (!) 53   Resp 16   Wt 210 lb 12.8 oz (95.6 kg)   BMI 30.25 kg/m    Physical Exam Vitals reviewed.  Constitutional:      General: He is not in acute distress.    Appearance: Normal appearance. He is not ill-appearing, toxic-appearing or diaphoretic.  Eyes:  Conjunctiva/sclera: Conjunctivae normal.  Cardiovascular:     Rate and Rhythm: Normal rate and regular rhythm.     Pulses:          Dorsalis pedis pulses are 2+ on the right side and 1+ on the left side.       Posterior tibial pulses are 2+ on the right side and 2+ on the left side.     Heart sounds: Normal heart sounds. No murmur heard.    No friction rub. No gallop.  Pulmonary:     Effort: Pulmonary effort is normal. No respiratory distress.     Breath sounds: Normal breath sounds. No stridor. No wheezing, rhonchi or  rales.  Abdominal:     General: Bowel sounds are normal. There is no distension.     Palpations: Abdomen is soft.     Tenderness: There is no abdominal tenderness.  Musculoskeletal:     Right lower leg: No edema.     Left lower leg: No edema.     Right foot: Normal range of motion. No deformity or bunion.     Left foot: Normal range of motion. No deformity or bunion.  Feet:     Right foot:     Protective Sensation: 6 sites tested.  6 sites sensed.     Skin integrity: Dry skin present. No erythema.     Toenail Condition: Right toenails are abnormally thick.     Left foot:     Protective Sensation: 6 sites tested.  6 sites sensed.     Skin integrity: Dry skin present. No erythema.     Toenail Condition: Left toenails are abnormally thick.  Skin:    Findings: No erythema or rash.  Neurological:     Mental Status: He is alert and oriented to person, place, and time.       No results found for any visits on 02/26/23.  Assessment & Plan     Problem List Items Addressed This Visit       Endocrine   Type 2 diabetes mellitus with hyperglycemia, without long-term current use of insulin - Primary    Chronic  Improved  Will recheck A1c in July - Aug 2024 Patient has been able to tolerate transition from [facility] to jardiance  daily  Reports eye exam in 2023, normal         Return in about 3 months (around 05/17/2023) for diabetes, HTN .        The entirety of the information documented in the History of Present Illness, Review of Systems and Physical Exam were personally obtained by me. Portions of this information were initially documented by Hetty Ely, CMA . I, Ronnald Ramp, MD have reviewed the documentation above for thoroughness and accuracy.      Ronnald Ramp, MD  [facility] [phone] (phone) [phone] (fax)  [facility]

## 2023-02-26 ENCOUNTER — Encounter: Payer: Self-pay | Admitting: Family Medicine

## 2023-02-26 ENCOUNTER — Ambulatory Visit (INDEPENDENT_AMBULATORY_CARE_PROVIDER_SITE_OTHER): Payer: Medicare Other | Admitting: Family Medicine

## 2023-02-26 VITALS — BP 113/60 | HR 53 | Resp 16 | Wt 210.8 lb

## 2023-02-26 DIAGNOSIS — E1165 Type 2 diabetes mellitus with hyperglycemia: Secondary | ICD-10-CM | POA: Diagnosis not present

## 2023-02-26 NOTE — Patient Instructions (Addendum)
It was a pleasure to see you today!  Thank you for choosing [facility] for your primary care.   Christian [name] was seen for diabetes follow up.   Our plans for today were: Please continue your medications as prescribed  To keep you healthy, please keep in mind the following health maintenance items that you are due for:   Shingrix  Diabetes eye exam  COVID vaccine    You should return to our clinic in 3 months for diabetes.   Best Wishes,   Dr. Roxan Hockey

## 2023-02-26 NOTE — Assessment & Plan Note (Addendum)
Chronic  Improved  Will recheck A1c in July - Aug 2024 Patient has been able to tolerate transition from [facility] to jardiance  daily  Reports eye exam in 2023, normal

## 2023-03-21 ENCOUNTER — Other Ambulatory Visit: Payer: Self-pay | Admitting: Family Medicine

## 2023-04-26 ENCOUNTER — Other Ambulatory Visit: Payer: Self-pay | Admitting: Family Medicine

## 2023-05-14 DIAGNOSIS — Z0000 Encounter for general adult medical examination without abnormal findings: Secondary | ICD-10-CM | POA: Insufficient documentation

## 2023-05-14 NOTE — Patient Instructions (Signed)
Health Maintenance After Age 74 After age 74, you are at a higher risk for certain long-term diseases and infections as well as injuries from falls. Falls are a major cause of broken bones and head injuries in people who are older than age 74. Getting regular preventive care can help to keep you healthy and well. Preventive care includes getting regular testing and making lifestyle changes as recommended by your health care provider. Talk with your health care provider about: Which screenings and tests you should have. A screening is a test that checks for a disease when you have no symptoms. A diet and exercise plan that is right for you. What should I know about screenings and tests to prevent falls? Screening and testing are the best ways to find a health problem early. Early diagnosis and treatment give you the best chance of managing medical conditions that are common after age 74. Certain conditions and lifestyle choices may make you more likely to have a fall. Your health care provider may recommend: Regular vision checks. Poor vision and conditions such as cataracts can make you more likely to have a fall. If you wear glasses, make sure to get your prescription updated if your vision changes. Medicine review. Work with your health care provider to regularly review all of the medicines you are taking, including over-the-counter medicines. Ask your health care provider about any side effects that may make you more likely to have a fall. Tell your health care provider if any medicines that you take make you feel dizzy or sleepy. Strength and balance checks. Your health care provider may recommend certain tests to check your strength and balance while standing, walking, or changing positions. Foot health exam. Foot pain and numbness, as well as not wearing proper footwear, can make you more likely to have a fall. Screenings, including: Osteoporosis screening. Osteoporosis is a condition that causes  the bones to get weaker and break more easily. Blood pressure screening. Blood pressure changes and medicines to control blood pressure can make you feel dizzy. Depression screening. You may be more likely to have a fall if you have a fear of falling, feel depressed, or feel unable to do activities that you used to do. Alcohol use screening. Using too much alcohol can affect your balance and may make you more likely to have a fall. Follow these instructions at home: Lifestyle Do not drink alcohol if: Your health care provider tells you not to drink. If you drink alcohol: Limit how much you have to: 0-1 drink a day for women. 0-2 drinks a day for men. Know how much alcohol is in your drink. In the [place] one drink equals one 12 oz bottle of beer (355 mL), one 5 oz glass of wine (148 mL), or one 1 oz glass of hard liquor (44 mL). Do not use any products that contain nicotine or tobacco. These products include cigarettes, chewing tobacco, and vaping devices, such as e-cigarettes. If you need help quitting, ask your health care provider. Activity  Follow a regular exercise program to stay fit. This will help you maintain your balance. Ask your health care provider what types of exercise are appropriate for you. If you need a cane or walker, use it as recommended by your health care provider. Wear supportive shoes that have nonskid soles. Safety  Remove any tripping hazards, such as rugs, cords, and clutter. Install safety equipment such as grab bars in bathrooms and safety rails on stairs. Keep rooms and walkways   well-lit. General instructions Talk with your health care provider about your risks for falling. Tell your health care provider if: You fall. Be sure to tell your health care provider about all falls, even ones that seem minor. You feel dizzy, tiredness (fatigue), or off-balance. Take over-the-counter and prescription medicines only as told by your health care provider. These include  supplements. Eat a healthy diet and maintain a healthy weight. A healthy diet includes low-fat dairy products, low-fat (lean) meats, and fiber from whole grains, beans, and lots of fruits and vegetables. Stay current with your vaccines. Schedule regular health, dental, and eye exams. Summary Having a healthy lifestyle and getting preventive care can help to protect your health and wellness after age 74. Screening and testing are the best way to find a health problem early and help you avoid having a fall. Early diagnosis and treatment give you the best chance for managing medical conditions that are more common for people who are older than age 74. Falls are a major cause of broken bones and head injuries in people who are older than age 74. Take precautions to prevent a fall at home. Work with your health care provider to learn what changes you can make to improve your health and wellness and to prevent falls. This information is not intended to replace advice given to you by your health care provider. Make sure you discuss any questions you have with your health care provider. Document Revised: 03/17/2021 Document Reviewed: 03/17/2021 Elsevier Patient Education  2024 Elsevier Inc.  

## 2023-05-14 NOTE — Progress Notes (Unsigned)
I,Vanessa  Vital,acting as a Neurosurgeon for [employer], MD.,have documented all relevant documentation on the behalf of Ronnald Ramp, MD,as directed by  Ronnald Ramp, MD while in the presence of Ronnald Ramp, MD.  Annual Wellness Visit     Patient: Christian [name], Male    DOB: 04/09/1951, 72 y.o.   MRN: [id number] Visit Date: 05/17/2023  Today's Provider: Ronnald Ramp, MD   Chief Complaint  Patient presents with   Annual Exam   Subjective    Christian [name] is a 72 y.o. male who presents today for his Annual Wellness Visit.   He reports consuming a general diet. The patient does not participate in regular exercise at present. He generally feels well. He reports sleeping well. He does not have additional problems to discuss today.    Medications: Outpatient Medications Prior to Visit  Medication Sig   albuterol (VENTOLIN HFA) 108 (90 Base) MCG/ACT inhaler Inhale 2 puffs into the lungs every 4 (four) hours as needed for wheezing or shortness of breath.   aspirin 81 MG tablet Take 81 mg by mouth daily.    atorvastatin (LIPITOR) 40 MG tablet TAKE 1 TABLET(40 MG) BY MOUTH AT BEDTIME   Budeson-Glycopyrrol-Formoterol (BREZTRI AEROSPHERE) 160-9-4.8 MCG/ACT AERO Inhale 2 puffs into the lungs 2 (two) times daily.   cholecalciferol (VITAMIN D) 1000 UNITS tablet Take 1,000 Units by mouth daily.    glucose blood (CONTOUR NEXT TEST) test strip 1 each by Other route daily. Use as instructed   JARDIANCE 10 MG TABS tablet TAKE 1 TABLET(10 MG) BY MOUTH DAILY BEFORE BREAKFAST   metFORMIN (GLUCOPHAGE) 500 MG tablet Take 2 tablets (1,000 mg total) by mouth daily with breakfast.   MICROLET LANCETS MISC 1 each by Does not apply route daily.   Olmesartan-amLODIPine-HCTZ 40-10-25 MG TABS Take 1 tablet by mouth daily.   No facility-administered medications prior to visit.    No Known Allergies  Patient Care Team: Ronnald Ramp, MD as PCP - General (Family Medicine) Lemar Livings Merrily Pew, MD as Consulting Physician (General Surgery) [facility], [facility] (Optometry) Midge Minium, MD as Consulting Physician (Gastroenterology)  Review of Systems      Objective    Vitals: BP 115/73 (BP Location: Left Arm, Patient Position: Sitting, Cuff Size: Normal)   Pulse (!) 58   Resp 13   Ht 5\' 11"  (1.803 m)   Wt 210 lb 6.4 oz (95.4 kg)   SpO2 98%   BMI 29.34 kg/m     Physical Exam Vitals reviewed.  Constitutional:      General: He is not in acute distress.    Appearance: Normal appearance. He is not ill-appearing, toxic-appearing or diaphoretic.  HENT:     Head: Normocephalic and atraumatic.     Right Ear: Tympanic membrane and external ear normal.     Left Ear: Tympanic membrane and external ear normal.     Nose: Nose normal.     Mouth/Throat:     Mouth: Mucous membranes are moist.     Pharynx: No oropharyngeal exudate or posterior oropharyngeal erythema.  Eyes:     General: No scleral icterus.    Extraocular Movements: Extraocular movements intact.     Conjunctiva/sclera: Conjunctivae normal.     Pupils: Pupils are equal, round, and reactive to light.  Neck:     Vascular: No carotid bruit.  Cardiovascular:     Rate and Rhythm: Normal rate and regular rhythm.     Pulses: Normal pulses.  Heart sounds: Normal heart sounds. No murmur heard.    No friction rub. No gallop.  Pulmonary:     Effort: Pulmonary effort is normal. No respiratory distress.     Breath sounds: Normal breath sounds. No stridor. No wheezing, rhonchi or rales.  Abdominal:     General: Bowel sounds are normal. There is no distension.     Palpations: Abdomen is soft.     Tenderness: There is no abdominal tenderness.  Musculoskeletal:        General: No swelling, tenderness or signs of injury. Normal range of motion.     Cervical back: Normal range of motion and neck supple. No rigidity or tenderness.     Right lower leg: No  edema.     Left lower leg: No edema.  Lymphadenopathy:     Cervical: No cervical adenopathy.  Skin:    General: Skin is warm and dry.     Capillary Refill: Capillary refill takes less than 2 seconds.     Findings: No erythema or rash.  Neurological:     General: No focal deficit present.     Mental Status: He is alert and oriented to person, place, and time.     Cranial Nerves: Cranial nerves 2-12 are intact.     Sensory: Sensation is intact.     Motor: Motor function is intact. No weakness, tremor or abnormal muscle tone.     Gait: Gait normal.  Psychiatric:        Attention and Perception: Attention normal.        Mood and Affect: Mood normal.        Speech: Speech normal.        Behavior: Behavior normal. Behavior is cooperative.        Thought Content: Thought content normal.      Most recent functional status assessment:    05/17/2023    8:18 AM  In your present state of health, do you have any difficulty performing the following activities:  Hearing? 0  Vision? 0  Difficulty concentrating or making decisions? 0  Walking or climbing stairs? 0  Dressing or bathing? 0  Doing errands, shopping? 0   Most recent fall risk assessment:    05/17/2023    8:17 AM  Fall Risk   Falls in the past year? 0  Number falls in past yr: 0  Injury with Fall? 0  Risk for fall due to : No Fall Risks  Follow up Falls evaluation completed    Most recent depression screenings:    05/17/2023    8:17 AM 11/12/2022    8:33 AM  PHQ 2/9 Scores  PHQ - 2 Score 0 0  PHQ- 9 Score  0   Most recent cognitive screening:    01/19/2017    9:19 AM  6CIT Screen  What Year? 0 points  What month? 0 points  What time? 0 points  Count back from 20 0 points  Months in reverse 2 points  Repeat phrase 2 points  Total Score 4 points   Most recent Audit-C alcohol use screening    05/17/2023    8:18 AM  Alcohol Use Disorder Test (AUDIT)  1. How often do you have a drink containing alcohol? 3  2.  How many drinks containing alcohol do you have on a typical day when you are drinking? 1  3. How often do you have six or more drinks on one occasion? 2  AUDIT-C Score 6   A  score of 3 or more in women, and 4 or more in men indicates increased risk for alcohol abuse, EXCEPT if all of the points are from question 1   No results found for any visits on 05/17/23.  Assessment & Plan       Immunization History  Administered Date(s) Administered   Fluad Quad(high Dose 65+) 06/28/2019, 09/12/2020, 07/28/2022   Influenza Split 08/23/2012   Influenza, High Dose Seasonal PF 09/09/2015, 07/14/2016, 07/26/2017, 09/06/2018   Influenza,inj,Quad PF,6+ Mos 09/07/2013   Influenza-Unspecified 09/15/2021   PFIZER(Purple Top)SARS-COV-2 Vaccination 01/01/2020, 01/22/2020, 09/12/2020   Pneumococcal Conjugate-13 01/01/2015   Pneumococcal Polysaccharide-23 01/07/2016   Td 04/28/2021   Tdap 07/29/2011   Zoster, Live 09/07/2013    Health Maintenance  Topic Date Due   Zoster Vaccines- Shingrix (1 of 2) Never done   COVID-19 Vaccine (4 - 2023-24 season) 07/10/2022   OPHTHALMOLOGY EXAM  09/02/2022   INFLUENZA VACCINE  06/10/2023   HEMOGLOBIN A1C  07/29/2023   Diabetic kidney evaluation - Urine ACR  11/13/2023   Diabetic kidney evaluation - eGFR measurement  12/25/2023   FOOT EXAM  02/26/2024   Medicare Annual Wellness (AWV)  05/16/2024   Colonoscopy  01/11/2030   DTaP/Tdap/Td (3 - Td or Tdap) 04/29/2031   Pneumonia Vaccine 61+ Years old  Completed   Hepatitis C Screening  Completed   HPV VACCINES  Aged Out     Problem List Items Addressed This Visit     HLD (hyperlipidemia)    Chronic  stable Will recheck lipid panel today  He will continue atorvastatin 40mg  daily        Relevant Orders   Lipid panel   Essential hypertension    Controlled BP at goal Continue 40-10-25mg  olmesartan-amlodipine-hydrochlorothiazide daily  No medications changes today  CMP ordered        Relevant  Orders   Comprehensive metabolic panel   Type 2 diabetes mellitus with hyperglycemia, without long-term current use of insulin (HCC)    Chronic  Stable  Last A1c 7.1  Will repeat A1c today  Continue metformin 1000mg  daily  Continue jardiance 10mg  daily       Relevant Orders   Hemoglobin A1c   Encounter for annual wellness visit (AWV) in Medicare patient - Primary    Annual wellness visit completed today including all of the following: -Reviewed patient's family medical history -Reviewed and updated patient's list of medical providers -Completed assessment of cognitive impairment -Completed assessment of patient's functional ability -Provided patient with recommendations for health screening services as well as vaccines Health risk assessment completed and reviewed -Discussed recommendations for well-balanced diet in addition to 150 minutes of physical activity per week -recommended updated covid vaccine, Shingrix vaccine and regular DM eye exam (patient is seen at [facility] in [place])         Return in about 4 months (around 09/17/2023) for CHRONIC F/U.       Ronnald Ramp, MD  [facility] [phone] (phone) [phone] (fax)  [facility]

## 2023-05-17 ENCOUNTER — Ambulatory Visit (INDEPENDENT_AMBULATORY_CARE_PROVIDER_SITE_OTHER): Payer: Medicare Other | Admitting: Family Medicine

## 2023-05-17 ENCOUNTER — Encounter: Payer: Self-pay | Admitting: Family Medicine

## 2023-05-17 VITALS — BP 115/73 | HR 58 | Resp 13 | Ht 71.0 in | Wt 210.4 lb

## 2023-05-17 DIAGNOSIS — Z0000 Encounter for general adult medical examination without abnormal findings: Secondary | ICD-10-CM

## 2023-05-17 DIAGNOSIS — I10 Essential (primary) hypertension: Secondary | ICD-10-CM

## 2023-05-17 DIAGNOSIS — E1165 Type 2 diabetes mellitus with hyperglycemia: Secondary | ICD-10-CM | POA: Diagnosis not present

## 2023-05-17 DIAGNOSIS — E782 Mixed hyperlipidemia: Secondary | ICD-10-CM

## 2023-05-17 NOTE — Assessment & Plan Note (Signed)
Annual wellness visit completed today including all of the following: -Reviewed patient's family medical history -Reviewed and updated patient's list of medical providers -Completed assessment of cognitive impairment -Completed assessment of patient's functional ability -Provided patient with recommendations for health screening services as well as vaccines Health risk assessment completed and reviewed -Discussed recommendations for well-balanced diet in addition to 150 minutes of physical activity per week -recommended updated covid vaccine, Shingrix vaccine and regular DM eye exam (patient is seen at [facility] in [place] )

## 2023-05-17 NOTE — Assessment & Plan Note (Signed)
Chronic  Stable  Last A1c 7.1  Will repeat A1c today  Continue metformin 1000mg  daily  Continue jardiance 10mg  daily

## 2023-05-17 NOTE — Assessment & Plan Note (Signed)
Controlled BP at goal Continue 40-10-25mg  olmesartan-amlodipine-hydrochlorothiazide daily  No medications changes today  CMP ordered

## 2023-05-17 NOTE — Assessment & Plan Note (Signed)
Chronic  stable Will recheck lipid panel today  He will continue atorvastatin 40mg  daily

## 2023-05-18 LAB — LIPID PANEL
Chol/HDL Ratio: 3 ratio (ref 0.0–5.0)
Cholesterol, Total: 124 mg/dL (ref 100–199)
HDL: 41 mg/dL (ref >39)
LDL Chol Calc (NIH): 62 mg/dL (ref 0–99)
Triglycerides: 117 mg/dL (ref 0–149)
VLDL Cholesterol Cal: 21 mg/dL (ref 5–40)

## 2023-05-18 LAB — COMPREHENSIVE METABOLIC PANEL
ALT: 37 IU/L (ref 0–44)
AST: 23 IU/L (ref 0–40)
Albumin: 4.8 g/dL (ref 3.8–4.8)
Alkaline Phosphatase: 84 IU/L (ref 44–121)
BUN/Creatinine Ratio: 15 (ref 10–24)
BUN: 20 mg/dL (ref 8–27)
Bilirubin Total: 0.7 mg/dL (ref 0.0–1.2)
CO2: 24 mmol/L (ref 20–29)
Calcium: 10.7 mg/dL — ABNORMAL HIGH (ref 8.6–10.2)
Chloride: 100 mmol/L (ref 96–106)
Creatinine, Ser: 1.32 mg/dL — ABNORMAL HIGH (ref 0.76–1.27)
Globulin, Total: 2.3 g/dL (ref 1.5–4.5)
Glucose: 135 mg/dL — ABNORMAL HIGH (ref 70–99)
Potassium: 5.2 mmol/L (ref 3.5–5.2)
Sodium: 141 mmol/L (ref 134–144)
Total Protein: 7.1 g/dL (ref 6.0–8.5)
eGFR: 57 mL/min/{1.73_m2} — ABNORMAL LOW (ref >59)

## 2023-05-18 LAB — HEMOGLOBIN A1C
Est. average glucose Bld gHb Est-mCnc: 131 mg/dL
Hgb A1c MFr Bld: 6.2 % — ABNORMAL HIGH (ref 4.8–5.6)

## 2023-05-23 ENCOUNTER — Other Ambulatory Visit: Payer: Self-pay | Admitting: Family Medicine

## 2023-05-24 NOTE — Telephone Encounter (Signed)
Requested Prescriptions  Pending Prescriptions Disp Refills   empagliflozin (JARDIANCE) 10 MG TABS tablet [Pharmacy Med Name: JARDIANCE 10MG  TABLETS] 90 tablet 1    Sig: TAKE 1 TABLET(10 MG) BY MOUTH DAILY BEFORE BREAKFAST     Endocrinology:  Diabetes - SGLT2 Inhibitors Failed - 05/23/2023  6:12 PM      Failed - Cr in normal range and within 360 days    Creatinine, Ser  Date Value Ref Range Status  05/17/2023 1.32 (H) 0.76 - 1.27 mg/dL Final         Failed - eGFR in normal range and within 360 days    GFR calc Af Amer  Date Value Ref Range Status  09/12/2020 81 >59 mL/min/1.73 Final    Comment:    **In accordance with recommendations from the NKF-ASN Task force,**   [facility] is in the process of updating its eGFR calculation to the   2021 CKD-EPI creatinine equation that estimates kidney function   without a race variable.    GFR calc non Af Amer  Date Value Ref Range Status  09/12/2020 70 >59 mL/min/1.73 Final   eGFR  Date Value Ref Range Status  05/17/2023 57 (L) >59 mL/min/1.73 Final         Passed - HBA1C is between 0 and 7.9 and within 180 days    Hgb A1c MFr Bld  Date Value Ref Range Status  05/17/2023 6.2 (H) 4.8 - 5.6 % Final    Comment:             Prediabetes: 5.7 - 6.4          Diabetes: >6.4          Glycemic control for adults with diabetes: <7.0          Passed - Valid encounter within last 6 months    Recent Outpatient Visits           1 week ago Encounter for annual wellness visit (AWV) in Medicare patient   [place] [facility] Simmons-Robinson, [place], MD   2 months ago Type 2 diabetes mellitus with hyperglycemia, without long-term current use of insulin (HCC)   [facility] Simmons-Robinson, [place], MD   3 months ago Type 2 diabetes mellitus with hyperglycemia, without long-term current use of insulin (HCC)   [place] [facility] Simmons-Robinson, [place] [facility] [place], [place]   5 months ago Type 2  diabetes mellitus with hyperglycemia, without long-term current use of insulin (HCC)   [place] [facility] Simmons-Robinson, [place], MD   6 months ago Diabetes mellitus without complication (HCC)   [facility] Simmons-Robinson, Tawanna Cooler, MD       Future Appointments             In 3 months Simmons-Robinson, Tawanna Cooler, MD [facility]

## 2023-07-05 ENCOUNTER — Other Ambulatory Visit: Payer: Self-pay

## 2023-07-05 ENCOUNTER — Telehealth: Payer: Self-pay | Admitting: Family Medicine

## 2023-07-05 DIAGNOSIS — I10 Essential (primary) hypertension: Secondary | ICD-10-CM

## 2023-07-05 NOTE — Telephone Encounter (Signed)
[facility] faxed refill request for the following medications:   metFORMIN (GLUCOPHAGE) 500 MG tablet    Please advise.  

## 2023-07-06 LAB — COMPREHENSIVE METABOLIC PANEL
ALT: 24 IU/L (ref 0–44)
AST: 14 IU/L (ref 0–40)
Albumin: 4.5 g/dL (ref 3.8–4.8)
Alkaline Phosphatase: 89 IU/L (ref 44–121)
BUN/Creatinine Ratio: 15 (ref 10–24)
BUN: 17 mg/dL (ref 8–27)
Bilirubin Total: 0.6 mg/dL (ref 0.0–1.2)
CO2: 24 mmol/L (ref 20–29)
Calcium: 10 mg/dL (ref 8.6–10.2)
Chloride: 102 mmol/L (ref 96–106)
Creatinine, Ser: 1.1 mg/dL (ref 0.76–1.27)
Globulin, Total: 2.1 g/dL (ref 1.5–4.5)
Glucose: 141 mg/dL — ABNORMAL HIGH (ref 70–99)
Potassium: 4.8 mmol/L (ref 3.5–5.2)
Sodium: 142 mmol/L (ref 134–144)
Total Protein: 6.6 g/dL (ref 6.0–8.5)
eGFR: 70 mL/min/{1.73_m2} (ref >59)

## 2023-07-06 MED ORDER — METFORMIN HCL 500 MG PO TABS: 1000.0000 mg | ORAL_TABLET | Freq: Every day | ORAL | 1 refills | Status: DC

## 2023-08-23 ENCOUNTER — Other Ambulatory Visit: Payer: Self-pay | Admitting: Family Medicine

## 2023-08-24 NOTE — Telephone Encounter (Signed)
Refused Jardiance 10 mg because it's being requested too soon.

## 2023-09-17 NOTE — Progress Notes (Unsigned)
Established patient visit   Patient: Christian [name]   DOB: 23-Aug-1951   72 y.o. Male  MRN: [id number] Visit Date: 09/20/2023  Today's healthcare provider: Ronnald Ramp, MD   No chief complaint on file.  Subjective       Discussed the use of AI scribe software for clinical note transcription with the patient, who gave verbal consent to proceed.  History of Present Illness             Past Medical History:  Diagnosis Date   Anxiety    Bradycardia    Hyperlipidemia    Hypertension    Prediabetes     Medications: Outpatient Medications Prior to Visit  Medication Sig   albuterol (VENTOLIN HFA) 108 (90 Base) MCG/ACT inhaler Inhale 2 puffs into the lungs every 4 (four) hours as needed for wheezing or shortness of breath.   aspirin 81 MG tablet Take 81 mg by mouth daily.    atorvastatin (LIPITOR) 40 MG tablet TAKE 1 TABLET(40 MG) BY MOUTH AT BEDTIME   Budeson-Glycopyrrol-Formoterol (BREZTRI AEROSPHERE) 160-9-4.8 MCG/ACT AERO Inhale 2 puffs into the lungs 2 (two) times daily.   cholecalciferol (VITAMIN D) 1000 UNITS tablet Take 1,000 Units by mouth daily.    empagliflozin (JARDIANCE) 10 MG TABS tablet TAKE 1 TABLET(10 MG) BY MOUTH DAILY BEFORE BREAKFAST   glucose blood (CONTOUR NEXT TEST) test strip 1 each by Other route daily. Use as instructed   metFORMIN (GLUCOPHAGE) 500 MG tablet Take 2 tablets (1,000 mg total) by mouth daily with breakfast.   MICROLET LANCETS MISC 1 each by Does not apply route daily.   Olmesartan-amLODIPine-HCTZ 40-10-25 MG TABS Take 1 tablet by mouth daily.   No facility-administered medications prior to visit.    Review of Systems  Last metabolic panel Lab Results  Component Value Date   GLUCOSE 141 (H) 07/05/2023   NA 142 07/05/2023   K 4.8 07/05/2023   CL 102 07/05/2023   CO2 24 07/05/2023   BUN 17 07/05/2023   CREATININE 1.10 07/05/2023   EGFR 70 07/05/2023   CALCIUM 10.0 07/05/2023   PROT 6.6 07/05/2023    ALBUMIN 4.5 07/05/2023   LABGLOB 2.1 07/05/2023   AGRATIO 2.1 12/24/2022   BILITOT 0.6 07/05/2023   ALKPHOS 89 07/05/2023   AST 14 07/05/2023   ALT 24 07/05/2023   Last lipids Lab Results  Component Value Date   CHOL 124 05/17/2023   HDL 41 05/17/2023   LDLCALC 62 05/17/2023   TRIG 117 05/17/2023   CHOLHDL 3.0 05/17/2023   Last hemoglobin A1c Lab Results  Component Value Date   HGBA1C 6.2 (H) 05/17/2023   Last thyroid functions Lab Results  Component Value Date   TSH 1.350 09/23/2021     {See past labs  Heme  Chem  Endocrine  Serology  Results Review (optional):1}   Objective    There were no vitals taken for this visit. BP Readings from Last 3 Encounters:  05/17/23 115/73  02/26/23 113/60  01/26/23 99/63   Wt Readings from Last 3 Encounters:  05/17/23 210 lb 6.4 oz (95.4 kg)  02/26/23 210 lb 12.8 oz (95.6 kg)  01/26/23 214 lb (97.1 kg)    {See vitals history (optional):1}      Physical Exam  ***  No results found for any visits on 09/20/23.  Assessment & Plan     Problem List Items Addressed This Visit   None   Assessment and Plan  No follow-ups on file.         Ronnald Ramp, MD  [facility] [phone] (phone) [phone] (fax)  [facility]

## 2023-09-20 ENCOUNTER — Ambulatory Visit (INDEPENDENT_AMBULATORY_CARE_PROVIDER_SITE_OTHER): Payer: Medicare Other | Admitting: Family Medicine

## 2023-09-20 ENCOUNTER — Encounter: Payer: Self-pay | Admitting: Family Medicine

## 2023-09-20 VITALS — BP 110/61 | HR 52 | Temp 97.2°F | Ht 71.0 in | Wt 219.6 lb

## 2023-09-20 DIAGNOSIS — F419 Anxiety disorder, unspecified: Secondary | ICD-10-CM

## 2023-09-20 DIAGNOSIS — E1165 Type 2 diabetes mellitus with hyperglycemia: Secondary | ICD-10-CM | POA: Diagnosis not present

## 2023-09-20 DIAGNOSIS — F320 Major depressive disorder, single episode, mild: Secondary | ICD-10-CM

## 2023-09-20 DIAGNOSIS — E559 Vitamin D deficiency, unspecified: Secondary | ICD-10-CM

## 2023-09-20 DIAGNOSIS — E782 Mixed hyperlipidemia: Secondary | ICD-10-CM | POA: Diagnosis not present

## 2023-09-20 DIAGNOSIS — J4520 Mild intermittent asthma, uncomplicated: Secondary | ICD-10-CM

## 2023-09-20 DIAGNOSIS — I10 Essential (primary) hypertension: Secondary | ICD-10-CM | POA: Diagnosis not present

## 2023-09-20 NOTE — Assessment & Plan Note (Signed)
Hypertension Chronic and well-controlled.  Last comprehensive metabolic panel (CMP) in August 2024 was within normal limits with creatinine of 1.1 and potassium of 4.8. Discussed maintaining blood pressure control to prevent cardiovascular events. - Continue Olmesartan 40 mg, Amlodipine 10 mg, and Hydrochlorothiazide 25 mg (all in one tablet)

## 2023-09-20 NOTE — Assessment & Plan Note (Signed)
Chronic  Stable  Pt continues to avoid triggers such as dust  Continue albuterol 2 puffs PRN every 4 hours for wheezing or SOB  Reports no longer using breztri inhaler, this agent was discontinued

## 2023-09-20 NOTE — Assessment & Plan Note (Addendum)
Chronic and well-controlled.  Last hemoglobin A1c was 6.2% in July 2024, within goal range. Reports fasting blood glucose levels between 123-147 mg/dL and occasional postprandial levels up to 187 mg/dL. Discussed maintaining fasting blood glucose levels below 120 mg/dL and postprandial levels below 180 mg/dL to keep T5T within target range. Next A1c check scheduled for January or February 2025. - Continue Jardiance 10 mg daily - Continue Metformin 1000 mg daily (2 tablets) - Check hemoglobin A1c in February 2025 - Monitor blood glucose levels regularly

## 2023-09-20 NOTE — Assessment & Plan Note (Signed)
Vitamin D Deficiency  Chronic Taking Vitamin D 1000 IU daily. Discussed maintaining adequate vitamin D levels for bone health. - Check vitamin D level in  February 2025 - Continue Vitamin D 1000 IU daily

## 2023-09-20 NOTE — Assessment & Plan Note (Addendum)
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  Last lipid panel in July 2024 showed LDL of 62 mg/dL. Discussed achieving LDL less than 55 mg/dL for optimal cardiovascular risk reduction. - Continue Atorvastatin 40 mg daily -continue ASA 81mg 

## 2023-09-20 NOTE — Assessment & Plan Note (Signed)
Chronic  Stable on no current medications

## 2023-11-16 ENCOUNTER — Other Ambulatory Visit: Payer: Self-pay | Admitting: Family Medicine

## 2023-11-22 ENCOUNTER — Other Ambulatory Visit: Payer: Self-pay | Admitting: Family Medicine

## 2023-12-13 ENCOUNTER — Other Ambulatory Visit: Payer: Self-pay | Admitting: Family Medicine

## 2023-12-13 DIAGNOSIS — E1165 Type 2 diabetes mellitus with hyperglycemia: Secondary | ICD-10-CM

## 2023-12-13 DIAGNOSIS — E782 Mixed hyperlipidemia: Secondary | ICD-10-CM

## 2023-12-13 DIAGNOSIS — I10 Essential (primary) hypertension: Secondary | ICD-10-CM

## 2023-12-13 NOTE — Progress Notes (Signed)
A1c, lipids and CMP ordered for patient to have labs drawn prior to 12/22/23 appt

## 2023-12-14 DIAGNOSIS — E119 Type 2 diabetes mellitus without complications: Secondary | ICD-10-CM | POA: Diagnosis not present

## 2023-12-14 DIAGNOSIS — H40003 Preglaucoma, unspecified, bilateral: Secondary | ICD-10-CM | POA: Diagnosis not present

## 2023-12-14 DIAGNOSIS — H2513 Age-related nuclear cataract, bilateral: Secondary | ICD-10-CM | POA: Diagnosis not present

## 2023-12-14 DIAGNOSIS — M3501 Sicca syndrome with keratoconjunctivitis: Secondary | ICD-10-CM | POA: Diagnosis not present

## 2023-12-14 LAB — HM DIABETES EYE EXAM

## 2023-12-17 ENCOUNTER — Encounter: Payer: Self-pay | Admitting: Family Medicine

## 2023-12-20 DIAGNOSIS — E1165 Type 2 diabetes mellitus with hyperglycemia: Secondary | ICD-10-CM | POA: Diagnosis not present

## 2023-12-20 DIAGNOSIS — E782 Mixed hyperlipidemia: Secondary | ICD-10-CM | POA: Diagnosis not present

## 2023-12-20 DIAGNOSIS — I10 Essential (primary) hypertension: Secondary | ICD-10-CM | POA: Diagnosis not present

## 2023-12-21 LAB — CMP14+EGFR
ALT: 29 [IU]/L (ref 0–44)
AST: 18 [IU]/L (ref 0–40)
Albumin: 4.5 g/dL (ref 3.8–4.8)
Alkaline Phosphatase: 81 [IU]/L (ref 44–121)
BUN/Creatinine Ratio: 20 (ref 10–24)
BUN: 25 mg/dL (ref 8–27)
Bilirubin Total: 0.5 mg/dL (ref 0.0–1.2)
CO2: 22 mmol/L (ref 20–29)
Calcium: 9.8 mg/dL (ref 8.6–10.2)
Chloride: 103 mmol/L (ref 96–106)
Creatinine, Ser: 1.25 mg/dL (ref 0.76–1.27)
Globulin, Total: 2.1 g/dL (ref 1.5–4.5)
Glucose: 138 mg/dL — ABNORMAL HIGH (ref 70–99)
Potassium: 4.3 mmol/L (ref 3.5–5.2)
Sodium: 142 mmol/L (ref 134–144)
Total Protein: 6.6 g/dL (ref 6.0–8.5)
eGFR: 60 mL/min/{1.73_m2} (ref >59)

## 2023-12-21 LAB — LIPID PANEL
Chol/HDL Ratio: 3.1 {ratio} (ref 0.0–5.0)
Cholesterol, Total: 113 mg/dL (ref 100–199)
HDL: 36 mg/dL — ABNORMAL LOW (ref >39)
LDL Chol Calc (NIH): 59 mg/dL (ref 0–99)
Triglycerides: 90 mg/dL (ref 0–149)
VLDL Cholesterol Cal: 18 mg/dL (ref 5–40)

## 2023-12-21 LAB — HEMOGLOBIN A1C
Est. average glucose Bld gHb Est-mCnc: 140 mg/dL
Hgb A1c MFr Bld: 6.5 % — ABNORMAL HIGH (ref 4.8–5.6)

## 2023-12-22 ENCOUNTER — Encounter: Payer: Self-pay | Admitting: Family Medicine

## 2023-12-22 ENCOUNTER — Ambulatory Visit (INDEPENDENT_AMBULATORY_CARE_PROVIDER_SITE_OTHER): Payer: Medicare Other | Admitting: Family Medicine

## 2023-12-22 VITALS — BP 123/67 | HR 72 | Ht 69.0 in | Wt 219.5 lb

## 2023-12-22 DIAGNOSIS — I10 Essential (primary) hypertension: Secondary | ICD-10-CM

## 2023-12-22 DIAGNOSIS — E785 Hyperlipidemia, unspecified: Secondary | ICD-10-CM | POA: Diagnosis not present

## 2023-12-22 DIAGNOSIS — E1169 Type 2 diabetes mellitus with other specified complication: Secondary | ICD-10-CM

## 2023-12-22 DIAGNOSIS — E1165 Type 2 diabetes mellitus with hyperglycemia: Secondary | ICD-10-CM | POA: Diagnosis not present

## 2023-12-22 DIAGNOSIS — K219 Gastro-esophageal reflux disease without esophagitis: Secondary | ICD-10-CM | POA: Diagnosis not present

## 2023-12-22 DIAGNOSIS — Z7984 Long term (current) use of oral hypoglycemic drugs: Secondary | ICD-10-CM

## 2023-12-22 DIAGNOSIS — J4520 Mild intermittent asthma, uncomplicated: Secondary | ICD-10-CM | POA: Diagnosis not present

## 2023-12-22 NOTE — Progress Notes (Signed)
Established patient visit   Patient: Christian [name]   DOB: 1951/09/30   73 y.o. Male  MRN: [id number] Visit Date: 12/22/2023  Today's healthcare provider: Ronnald Ramp, MD   Chief Complaint  Patient presents with   Medical Management of Chronic Issues    3 month follow-up. Taking medications as prescribed and tolerating well with no side effects   Diabetes    Symptoms: none Eye Exam: [facility] Home readings : 125-137 fasting Low : no   Hypertension    Monitor : 120's-170's/ high 60's to low 70's Symptoms: none   Hyperlipidemia    Symptoms : none   Subjective     HPI     Medical Management of Chronic Issues    Additional comments: 3 month follow-up. Taking medications as prescribed and tolerating well with no side effects        Diabetes    Additional comments: Symptoms: none Eye Exam: [facility] Home readings : 125-137 fasting Low : no        Hypertension    Additional comments: Monitor : 120's-170's/ high 60's to low 70's Symptoms: none        Hyperlipidemia    Additional comments: Symptoms : none      Last edited by Acey Lav, CMA on 12/22/2023  8:15 AM.       Discussed the use of AI scribe software for clinical note transcription with the patient, who gave verbal consent to proceed.  History of Present Illness   Christian [name] "Christian [name]" is a 73 year old male with hypertension, type 2 diabetes, and hyperlipidemia who presents for chronic follow-up.  He is here for a chronic follow-up of his hypertension, which is managed with olmesartan-amlodipine 40-10-25 mg daily. His blood pressure is well controlled at 123/67 mmHg.  His type 2 diabetes is well managed with a recent A1c of 6.5%. He continues to take metformin 1000 mg daily and Jardiance 10 mg daily.  Hyperlipidemia is being managed with atorvastatin 40 mg daily. His HDL cholesterol is low at 36 mg/dL, with a target of 40 mg/dL.  He has a history  of reactive airway disease and uses albuterol 108 mcg every four hours as needed for wheezing. Currently, there are no signs of wheezing or respiratory distress.  He has dry eyes and uses eye drops three times a day. He avoids driving at night due to this issue.  He notes joint discomfort but states 'it ain't killing me.'  He recalls receiving the Zostavax vaccine in 2014 and discusses the possibility of getting the Shingrix vaccine. He is up to date with the flu vaccine but has declined the COVID-19 booster for the 2025 season.         Past Medical History:  Diagnosis Date   Anxiety    Bradycardia    Hyperlipidemia    Hypertension    Type 2 diabetes mellitus (HCC)     Medications: Outpatient Medications Prior to Visit  Medication Sig   albuterol (VENTOLIN HFA) 108 (90 Base) MCG/ACT inhaler Inhale 2 puffs into the lungs every 4 (four) hours as needed for wheezing or shortness of breath.   aspirin 81 MG tablet Take 81 mg by mouth daily.    atorvastatin (LIPITOR) 40 MG tablet TAKE 1 TABLET(40 MG) BY MOUTH AT BEDTIME   cholecalciferol (VITAMIN D) 1000 UNITS tablet Take 1,000 Units by mouth daily.    empagliflozin (JARDIANCE) 10 MG TABS tablet TAKE 1 TABLET  BY MOUTH DAILY BEFORE BREAKFAST   glucose blood (CONTOUR NEXT TEST) test strip 1 each by Other route daily. Use as instructed   metFORMIN (GLUCOPHAGE) 500 MG tablet TAKE 2 TABLETS(1000 MG) BY MOUTH DAILY WITH BREAKFAST   MICROLET LANCETS MISC 1 each by Does not apply route daily.   Olmesartan-amLODIPine-HCTZ 40-10-25 MG TABS Take 1 tablet by mouth daily.   No facility-administered medications prior to visit.    Review of Systems  Last CBC Lab Results  Component Value Date   WBC 7.2 09/23/2021   HGB 16.0 09/23/2021   HCT 45.7 09/23/2021   MCV 92 09/23/2021   MCH 32.3 09/23/2021   RDW 12.3 09/23/2021   PLT 222 09/23/2021   Last metabolic panel Lab Results  Component Value Date   GLUCOSE 138 (H) 12/20/2023   NA 142  12/20/2023   K 4.3 12/20/2023   CL 103 12/20/2023   CO2 22 12/20/2023   BUN 25 12/20/2023   CREATININE 1.25 12/20/2023   EGFR 60 12/20/2023   CALCIUM 9.8 12/20/2023   PROT 6.6 12/20/2023   ALBUMIN 4.5 12/20/2023   LABGLOB 2.1 12/20/2023   AGRATIO 2.1 12/24/2022   BILITOT 0.5 12/20/2023   ALKPHOS 81 12/20/2023   AST 18 12/20/2023   ALT 29 12/20/2023   Last lipids Lab Results  Component Value Date   CHOL 113 12/20/2023   HDL 36 (L) 12/20/2023   LDLCALC 59 12/20/2023   TRIG 90 12/20/2023   CHOLHDL 3.1 12/20/2023   Last hemoglobin A1c Lab Results  Component Value Date   HGBA1C 6.5 (H) 12/20/2023         Objective    BP 123/67 (BP Location: Left Arm, Patient Position: Sitting, Cuff Size: Normal)   Pulse 72   Ht 5\' 9"  (1.753 m)   Wt 219 lb 8 oz (99.6 kg)   SpO2 98%   BMI 32.41 kg/m  BP Readings from Last 3 Encounters:  12/22/23 123/67  09/20/23 110/61  05/17/23 115/73   Wt Readings from Last 3 Encounters:  12/22/23 219 lb 8 oz (99.6 kg)  09/20/23 219 lb 9.6 oz (99.6 kg)  05/17/23 210 lb 6.4 oz (95.4 kg)        Physical Exam  Physical Exam   VITALS: BP- 123/67 CHEST: Lungs clear to auscultation, no wheezing, no crackles. CARDIOVASCULAR: Heart sounds regular rate and rhythm.       No results found for any visits on 12/22/23.  Assessment & Plan     Problem List Items Addressed This Visit       Cardiovascular and Mediastinum   Essential hypertension   Hypertension, well-controlled with a blood pressure reading of 123/67 mmHg. Currently on a combination of olmesartan, amlodipine, and hydrochlorothiazide. Chronic  - Continue olmesartan/amlodipine/hydrochlorothiazide 40/10/25 mg daily        Respiratory   RAD (reactive airway disease)   Reactive Airway Disease Chronic, stable  Reactive airway disease, managed with albuterol as needed for wheezing. No current symptoms reported. - Continue albuterol 108 mcg every 4 hours as needed for wheezing         Digestive   Acid reflux     Endocrine   Type 2 diabetes mellitus with hyperglycemia, without long-term current use of insulin (HCC) - Primary   Type 2 diabetes mellitus with hyperglycemia, well-controlled with an A1c of 6.5% as of December 20, 2023. Currently on metformin, Jardiance, and atorvastatin. Chronic - Continue metformin 1000 mg daily - Continue Jardiance 10 mg daily - Continue  atorvastatin 40 mg daily - Measure urine albumin - UTD on DM eye exam, seen at [facility] center        Relevant Orders   Urine microalbumin-creatinine with uACR   Hyperlipidemia associated with type 2 diabetes mellitus (HCC)   Chronic Hyperlipidemia associated with type 2 diabetes. LDL levels are well-controlled, but HDL is slightly low at 36 mg/dL. Advised to increase physical activity to improve HDL levels. - Encourage regular exercise to improve HDL levels - Continue atorvastatin 40 mg daily      Dry Eyes Chronic Dry eyes managed with eye drops three times a day. Awaiting cataract surgery, contingent on the management of dry eyes. Ophthalmologist will re-evaluate in three months. - Continue using eye drops three times a day - Follow up with ophthalmologist in three months for cataract surgery evaluation  General Health Maintenance Up to date with flu vaccination but has declined the COVID-19 booster for the 2025 season. Previously received the Zostavax vaccine in 2014 and is now recommended to receive the Shingrix vaccine. Shingrix is 90% effective in preventing shingles but may cause flu-like symptoms for up to three days. - Recommended Shingrix vaccine (two doses, two months apart) - Provided information on Shingrix vaccine - Recommend COVID-19 booster for 2025 season   Return in about 4 months (around 04/20/2024) for AWV.      Ronnald Ramp, MD  [facility] [phone] (phone) [phone] (fax)  [facility]

## 2023-12-22 NOTE — Assessment & Plan Note (Signed)
Type 2 diabetes mellitus with hyperglycemia, well-controlled with an A1c of 6.5% as of December 20, 2023. Currently on metformin, Jardiance, and atorvastatin. Chronic - Continue metformin 1000 mg daily - Continue Jardiance 10 mg daily - Continue atorvastatin 40 mg daily - Measure urine albumin - UTD on DM eye exam, seen at [facility] center

## 2023-12-22 NOTE — Assessment & Plan Note (Signed)
Reactive Airway Disease Chronic, stable  Reactive airway disease, managed with albuterol as needed for wheezing. No current symptoms reported. - Continue albuterol 108 mcg every 4 hours as needed for wheezing

## 2023-12-22 NOTE — Assessment & Plan Note (Signed)
Hypertension, well-controlled with a blood pressure reading of 123/67 mmHg. Currently on a combination of olmesartan, amlodipine, and hydrochlorothiazide. Chronic  - Continue olmesartan/amlodipine/hydrochlorothiazide 40/10/25 mg daily

## 2023-12-22 NOTE — Assessment & Plan Note (Signed)
Chronic Hyperlipidemia associated with type 2 diabetes. LDL levels are well-controlled, but HDL is slightly low at 36 mg/dL. Advised to increase physical activity to improve HDL levels. - Encourage regular exercise to improve HDL levels - Continue atorvastatin 40 mg daily

## 2023-12-23 ENCOUNTER — Encounter: Payer: Self-pay | Admitting: Family Medicine

## 2023-12-23 LAB — MICROALBUMIN / CREATININE URINE RATIO
Creatinine, Urine: 221.1 mg/dL
Microalb/Creat Ratio: 11 mg/g{creat} (ref 0–29)
Microalbumin, Urine: 23.5 ug/mL

## 2023-12-24 ENCOUNTER — Telehealth: Payer: Self-pay

## 2023-12-24 NOTE — Telephone Encounter (Signed)
Copied from CRM [id number]. Topic: General - Other >> Dec 23, 2023 11:33 AM Epimenio Foot F wrote: Reason for CRM: Pt's wife Clydie Braun is calling in because she needs his vaccination record. Clydie Braun is requesting it be sent through MyChart.

## 2023-12-27 ENCOUNTER — Encounter: Payer: Self-pay | Admitting: Family Medicine

## 2023-12-29 NOTE — Telephone Encounter (Signed)
 Pt wife advised. Not able to send record via mychart but they are available to see via mychart. She verbalized understanding

## 2024-01-24 ENCOUNTER — Other Ambulatory Visit: Payer: Self-pay | Admitting: Family Medicine

## 2024-01-24 DIAGNOSIS — R053 Chronic cough: Secondary | ICD-10-CM

## 2024-01-24 NOTE — Telephone Encounter (Signed)
 Copied from CRM [id number]. Topic: Clinical - Medication Refill >> Jan 24, 2024 10:48 AM Franchot Heidelberg wrote: Most Recent Primary Care Visit:  Provider: Ronnald Ramp  Department: [facility]  Visit Type: OFFICE VISIT  Date: 12/22/2023  Medication: albuterol (VENTOLIN HFA) 108 (90 Base) MCG/ACT inhaler  Budeson-Glycopyrrol-Formoterol (BREZTRI AEROSPHERE) 160-9-4.8 MCG/ACT AERO  Has the patient contacted their pharmacy? Yes (Agent: If no, request that the patient contact the pharmacy for the refill. If patient does not wish to contact the pharmacy document the reason why and proceed with request.) (Agent: If yes, when and what did the pharmacy advise?)  Is this the correct pharmacy for this prescription? Yes If no, delete pharmacy and type the correct one.  This is the patient's preferred pharmacy:   [id number] [place], [place] - [address] AT [facility] OF [address] [place] [address] Phone: [phone] Fax: [phone]   Has the prescription been filled recently? Yes  Is the patient out of the medication? Yes  Has the patient been seen for an appointment in the last year OR does the patient have an upcoming appointment? Yes  Can we respond through MyChart? Yes  Agent: Please be advised that Rx refills may take up to 3 business days. We ask that you follow-up with your pharmacy.

## 2024-01-24 NOTE — Telephone Encounter (Signed)
 Patient called, left VM to return the call to the office regarding a refill. Christian [name] was discontinued by the provider on 09/20/23 and noted in last OV note on 12/22/23 to continue prn with Albuterol, no mention of continuing Breztri. Will submit request for albuterol, but patient will need to speak to NT regarding Breztri if he returns the call.

## 2024-01-25 MED ORDER — ALBUTEROL SULFATE HFA 108 (90 BASE) MCG/ACT IN AERS: 2.0000 | INHALATION_SPRAY | RESPIRATORY_TRACT | 2 refills | Status: DC | PRN

## 2024-01-25 NOTE — Telephone Encounter (Signed)
 Requested medication (s) are due for refill today: routing for review  Requested medication (s) are on the active medication list: yes  Last refill:  05/22/22  Future visit scheduled: no  Notes to clinic:  Unable to refill per protocol, last refill by another provider no longer at this practice, routing to PCP for approval.     Requested Prescriptions  Pending Prescriptions Disp Refills   albuterol (VENTOLIN HFA) 108 (90 Base) MCG/ACT inhaler 18 g 2    Sig: Inhale 2 puffs into the lungs every 4 (four) hours as needed for wheezing or shortness of breath.     Pulmonology:  Beta Agonists 2 Passed - 01/25/2024  3:05 PM      Passed - Last BP in normal range    BP Readings from Last 1 Encounters:  12/22/23 123/67         Passed - Last Heart Rate in normal range    Pulse Readings from Last 1 Encounters:  12/22/23 72         Passed - Valid encounter within last 12 months    Recent Outpatient Visits           4 months ago Essential hypertension   [place] [facility] Simmons-Robinson, [place], MD   8 months ago Encounter for annual wellness visit (AWV) in Medicare patient   [place] [facility] [place], [place], MD   11 months ago Type 2 diabetes mellitus with hyperglycemia, without long-term current use of insulin (HCC)   [place] [facility] Simmons-Robinson, [place], MD   12 months ago Type 2 diabetes mellitus with hyperglycemia, without long-term current use of insulin (HCC)   [place] [facility] Simmons-Robinson, [place], MD   1 year ago Type 2 diabetes mellitus with hyperglycemia, without long-term current use of insulin (HCC)   [place] [facility] [place], Tawanna Cooler, MD

## 2024-02-17 ENCOUNTER — Other Ambulatory Visit: Payer: Self-pay | Admitting: Family Medicine

## 2024-02-17 DIAGNOSIS — I10 Essential (primary) hypertension: Secondary | ICD-10-CM

## 2024-02-17 NOTE — Telephone Encounter (Signed)
 Requested Prescriptions  Pending Prescriptions Disp Refills   Olmesartan-amLODIPine-HCTZ 40-10-25 MG TABS [Pharmacy Med Name: OLMESARTAN/AMLOD/HCTZ 40-10-25MG  T] 90 tablet 3    Sig: TAKE 1 TABLET BY MOUTH DAILY     Cardiovascular: CCB + ARB + Diuretic Combos Passed - 02/17/2024  3:27 PM      Passed - K in normal range and within 180 days    Potassium  Date Value Ref Range Status  12/20/2023 4.3 3.5 - 5.2 mmol/L Final         Passed - Na in normal range and within 180 days    Sodium  Date Value Ref Range Status  12/20/2023 142 134 - 144 mmol/L Final         Passed - Cr in normal range and within 180 days    Creatinine, Ser  Date Value Ref Range Status  12/20/2023 1.25 0.76 - 1.27 mg/dL Final         Passed - eGFR is 10 or above and within 180 days    GFR calc Af Amer  Date Value Ref Range Status  09/12/2020 81 >59 mL/min/1.73 Final    Comment:    **In accordance with recommendations from the NKF-ASN Task force,**   [facility] is in the process of updating its eGFR calculation to the   2021 CKD-EPI creatinine equation that estimates kidney function   without a race variable.    GFR calc non Af Amer  Date Value Ref Range Status  09/12/2020 70 >59 mL/min/1.73 Final   eGFR  Date Value Ref Range Status  12/20/2023 60 >59 mL/min/1.73 Final         Passed - Patient is not pregnant      Passed - Last BP in normal range    BP Readings from Last 1 Encounters:  12/22/23 123/67         Passed - Last Heart Rate in normal range    Pulse Readings from Last 1 Encounters:  12/22/23 72         Passed - Valid encounter within last 6 months    Recent Outpatient Visits           1 month ago Type 2 diabetes mellitus with hyperglycemia, without long-term current use of insulin (HCC)   [place] [facility] Simmons-Robinson, [place], MD               atorvastatin (LIPITOR) 40 MG tablet [Pharmacy Med Name: ATORVASTATIN 40MG  TABLETS] 90 tablet 3    Sig: TAKE 1  TABLET(40 MG) BY MOUTH AT BEDTIME     Cardiovascular:  Antilipid - Statins Failed - 02/17/2024  3:27 PM      Failed - Lipid Panel in normal range within the last 12 months    Cholesterol, Total  Date Value Ref Range Status  12/20/2023 113 100 - 199 mg/dL Final   LDL Chol Calc (NIH)  Date Value Ref Range Status  12/20/2023 59 0 - 99 mg/dL Final   HDL  Date Value Ref Range Status  12/20/2023 36 (L) >39 mg/dL Final   Triglycerides  Date Value Ref Range Status  12/20/2023 90 0 - 149 mg/dL Final         Passed - Patient is not pregnant      Passed - Valid encounter within last 12 months    Recent Outpatient Visits           1 month ago Type 2 diabetes mellitus with hyperglycemia, without long-term current use of  insulin [facility])   [place] [facility] [place], Tawanna Cooler, MD

## 2024-04-20 ENCOUNTER — Ambulatory Visit (INDEPENDENT_AMBULATORY_CARE_PROVIDER_SITE_OTHER): Payer: Medicare Other | Admitting: Family Medicine

## 2024-04-20 VITALS — BP 113/58 | HR 51 | Ht 69.0 in | Wt 222.0 lb

## 2024-04-20 DIAGNOSIS — E559 Vitamin D deficiency, unspecified: Secondary | ICD-10-CM | POA: Diagnosis not present

## 2024-04-20 DIAGNOSIS — I10 Essential (primary) hypertension: Secondary | ICD-10-CM | POA: Diagnosis not present

## 2024-04-20 DIAGNOSIS — Z0000 Encounter for general adult medical examination without abnormal findings: Secondary | ICD-10-CM

## 2024-04-20 DIAGNOSIS — Z130 Encounter for screening for diseases of the blood and blood-forming organs and certain disorders involving the immune mechanism: Secondary | ICD-10-CM | POA: Diagnosis not present

## 2024-04-20 DIAGNOSIS — L578 Other skin changes due to chronic exposure to nonionizing radiation: Secondary | ICD-10-CM | POA: Diagnosis not present

## 2024-04-20 DIAGNOSIS — E1169 Type 2 diabetes mellitus with other specified complication: Secondary | ICD-10-CM

## 2024-04-20 DIAGNOSIS — K219 Gastro-esophageal reflux disease without esophagitis: Secondary | ICD-10-CM | POA: Diagnosis not present

## 2024-04-20 DIAGNOSIS — Z0001 Encounter for general adult medical examination with abnormal findings: Secondary | ICD-10-CM

## 2024-04-20 DIAGNOSIS — E785 Hyperlipidemia, unspecified: Secondary | ICD-10-CM | POA: Diagnosis not present

## 2024-04-20 DIAGNOSIS — E1165 Type 2 diabetes mellitus with hyperglycemia: Secondary | ICD-10-CM | POA: Diagnosis not present

## 2024-04-20 NOTE — Patient Instructions (Signed)
 Recommended Vaccines  -Shingrix

## 2024-04-20 NOTE — Progress Notes (Signed)
 Subjective:   Christian [name] is a 73 y.o. male who presents for Medicare Annual/Subsequent preventive examination.  Visit Complete: In person  Patient Medicare AWV questionnaire was completed by the patient on 04/20/24; I have confirmed that all information answered by patient is correct and no changes since this date.  Cardiac Risk Factors include: diabetes mellitus     Objective:    Today's Vitals   04/20/24 0815  BP: (!) 113/58  Pulse: (!) 51  SpO2: 98%  Weight: 222 lb (100.7 kg)  Height: 5' 9 (1.753 m)  PainSc: 0-No pain   Body mass index is 32.78 kg/m.     06/26/2020    8:31 AM 01/12/2020    8:19 AM 05/18/2019   10:52 AM 01/25/2018    8:46 AM 01/19/2017    9:12 AM 01/07/2016    9:30 AM  Advanced Directives  Does Patient Have a Medical Advance Directive? Yes Yes No No  No  No   Type of Estate agent of [place];Living will Living will      Copy of Healthcare Power of Attorney in Chart? No - copy requested       Would patient like information on creating a medical advance directive?   No - Patient declined   No - Patient declined       Data saved with a previous flowsheet row definition    Current Medications (verified) Outpatient Encounter Medications as of 04/20/2024  Medication Sig   albuterol  (VENTOLIN  HFA) 108 (90 Base) MCG/ACT inhaler Inhale 2 puffs into the lungs every 4 (four) hours as needed for wheezing or shortness of breath.   aspirin 81 MG tablet Take 81 mg by mouth daily.    atorvastatin  (LIPITOR) 40 MG tablet TAKE 1 TABLET(40 MG) BY MOUTH AT BEDTIME   cholecalciferol (VITAMIN D) 1000 UNITS tablet Take 1,000 Units by mouth daily.    empagliflozin  (JARDIANCE ) 10 MG TABS tablet TAKE 1 TABLET BY MOUTH DAILY BEFORE BREAKFAST   glucose blood (CONTOUR NEXT TEST) test strip 1 each by Other route daily. Use as instructed   metFORMIN  (GLUCOPHAGE ) 500 MG tablet TAKE 2 TABLETS(1000 MG) BY MOUTH DAILY WITH BREAKFAST   MICROLET LANCETS MISC  1 each by Does not apply route daily.   Olmesartan -amLODIPine -HCTZ 40-10-25 MG TABS TAKE 1 TABLET BY MOUTH DAILY   No facility-administered encounter medications on file as of 04/20/2024.    Allergies (verified) Patient has no known allergies.   History: Past Medical History:  Diagnosis Date   Anxiety    Bradycardia    Hyperlipidemia    Hypertension    Type 2 diabetes mellitus (HCC)    Past Surgical History:  Procedure Laterality Date   COLONOSCOPY WITH PROPOFOL  N/A 01/12/2020   Procedure: COLONOSCOPY WITH PROPOFOL ;  Surgeon: Marnee Sink, MD;  Location: [facility] ENDOSCOPY;  Service: Endoscopy;  Laterality: N/A;   INGUINAL HERNIA REPAIR Left    vasectomy  1978   Family History  Problem Relation Age of Onset   Dementia Mother    Colon cancer Father    Hypertension Sister    Hypertension Son    Social History   Socioeconomic History   Marital status: Married    Spouse name: Not on file   Number of children: 3   Years of education: Not on file   Highest education level: Some college, no degree  Occupational History   Occupation: 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  Tobacco Use  Smoking status: Never   Smokeless tobacco: Current    Types: Snuff   Tobacco comments:    uses smokeless tobacco dip  Vaping Use   Vaping status: Never Used  Substance and Sexual Activity   Alcohol use: Yes    Alcohol/week: 15.0 standard drinks of alcohol    Types: 15 Cans of beer per week    Comment: 3/day x 5   Drug use: No   Sexual activity: Not on file  Other Topics Concern   Not on file  Social History Narrative   Not on file   Social Drivers of Health   Financial Resource Strain: Low Risk  (04/20/2024)   Overall Financial Resource Strain (CARDIA)    Difficulty of Paying Living Expenses: Not hard at all  Food Insecurity: No Food Insecurity (04/20/2024)   Hunger Vital Sign    Worried About Running Out of Food in the Last Year: Never true    Ran Out of Food in  the Last Year: Never true  Transportation Needs: No Transportation Needs (04/20/2024)   PRAPARE - Administrator, Civil Service (Medical): No    Lack of Transportation (Non-Medical): No  Physical Activity: Insufficiently Active (04/20/2024)   Exercise Vital Sign    Days of Exercise per Week: 3 days    Minutes of Exercise per Session: 30 min  Stress: No Stress Concern Present (04/20/2024)   [employer] of Occupational Health - Occupational Stress Questionnaire    Feeling of Stress: Not at all  Social Connections: Socially Integrated (04/20/2024)   Social Connection and Isolation Panel    Frequency of Communication with Friends and Family: More than three times a week    Frequency of Social Gatherings with Friends and Family: More than three times a week    Attends Religious Services: More than 4 times per year    Active Member of [employer] or Organizations: Yes    Attends Engineer, structural: More than 4 times per year    Marital Status: Married    Tobacco Counseling Ready to quit: Not Answered Counseling given: Not Answered Tobacco comments: uses smokeless tobacco dip   Clinical Intake:  Pre-visit preparation completed: Yes  Pain : No/denies pain Pain Score: 0-No pain     Nutritional Risks: None Diabetes: Yes  How often do you need to have someone help you when you read instructions, pamphlets, or other written materials from your doctor or pharmacy?: 1 - Never  Interpreter Needed?: No      Activities of Daily Living    04/20/2024    8:13 AM 04/20/2024    6:11 AM  In your present state of health, do you have any difficulty performing the following activities:  Hearing? 1 1  Vision? 0 1  Difficulty concentrating or making decisions? 0 0  Walking or climbing stairs? 0 0  Dressing or bathing? 0 0  Doing errands, shopping? 0 0  Preparing Food and eating ? N N  Using the Toilet? N   In the past six months, have you accidently leaked urine? N  N  Do you have problems with loss of bowel control? N N  Managing your Medications? N N  Managing your Finances? N N  Housekeeping or managing your Housekeeping? N N    Patient Care Team: Mimi Alt, MD as PCP - General (Family Medicine) Byrnett, Magali Schmitz, MD as Consulting Physician (General Surgery) [facility], [facility] (Optometry) Marnee Sink, MD as Consulting Physician (Gastroenterology)  Indicate  any recent Medical Services you may have received from other than [facility] providers in the past year (date may be approximate).   Physical Exam Constitutional:      General: He is not in acute distress.    Appearance: Normal appearance. He is not ill-appearing.  HENT:     Mouth/Throat:     Mouth: Mucous membranes are moist.   Eyes:     General: No scleral icterus.       Right eye: No discharge.        Left eye: No discharge.     Extraocular Movements: Extraocular movements intact.     Conjunctiva/sclera: Conjunctivae normal.     Pupils: Pupils are equal, round, and reactive to light.    Cardiovascular:     Rate and Rhythm: Normal rate and regular rhythm.     Pulses:          Dorsalis pedis pulses are 2+ on the right side and 2+ on the left side.       Posterior tibial pulses are 2+ on the right side and 2+ on the left side.     Heart sounds: Normal heart sounds.  Pulmonary:     Effort: Pulmonary effort is normal.     Breath sounds: Normal breath sounds.  Abdominal:     General: Bowel sounds are normal. There is no distension.     Palpations: Abdomen is soft.     Tenderness: There is no abdominal tenderness.   Musculoskeletal:        General: No deformity or signs of injury. Normal range of motion.     Cervical back: Normal range of motion and neck supple. No tenderness.     Right lower leg: No edema.     Left lower leg: No edema.     Right foot: Normal range of motion. No deformity or bunion.     Left foot: Normal range of motion. No deformity or  bunion.  Feet:     Right foot:     Protective Sensation: 6 sites tested.  6 sites sensed.     Skin integrity: Skin integrity normal. No erythema.     Toenail Condition: Right toenails are normal.     Left foot:     Protective Sensation: 6 sites tested.  6 sites sensed.     Skin integrity: Skin integrity normal. No erythema.     Toenail Condition: Left toenails are normal.  Lymphadenopathy:     Cervical: No cervical adenopathy.   Neurological:     Mental Status: He is alert and oriented to person, place, and time.     Cranial Nerves: No cranial nerve deficit.     Motor: No weakness.     Gait: Gait normal.   Psychiatric:        Mood and Affect: Mood normal.        Behavior: Behavior normal.         Assessment:   This is a routine wellness examination for Christian [name].  Hearing/Vision screen No results found.   Goals Addressed   None    Depression Screen    04/20/2024    8:16 AM 12/22/2023    8:16 AM 09/20/2023    8:12 AM 05/17/2023    8:17 AM 11/12/2022    8:33 AM 06/26/2020    8:28 AM 05/18/2019   10:52 AM  PHQ 2/9 Scores  PHQ - 2 Score 0 0 0 0 0 0 0  PHQ- 9 Score 0 0 0  0      Fall Risk    04/20/2024    6:11 AM 12/22/2023    8:16 AM 05/17/2023    8:17 AM 12/24/2022    8:29 AM 11/12/2022    8:33 AM  Fall Risk   Falls in the past year? 0 0 0 0 0  Number falls in past yr:  0 0 0 0  Injury with Fall?  0 0 0 0  Risk for fall due to :  No Fall Risks No Fall Risks No Fall Risks No Fall Risks  Follow up   Falls evaluation completed  Falls evaluation completed      Data saved with a previous flowsheet row definition    MEDICARE RISK AT HOME: Medicare Risk at Home Any stairs in or around the home?: (Patient-Rptd) Yes If so, are there any without handrails?: (Patient-Rptd) No Home free of loose throw rugs in walkways, pet beds, electrical cords, etc?: (Patient-Rptd) Yes Life alert?: (Patient-Rptd) No Use of a cane, walker or w/c?: (Patient-Rptd) No Grab bars in the  bathroom?: (Patient-Rptd) Yes Elevated toilet seat or a handicapped toilet?: (Patient-Rptd) Yes  TIMED UP AND GO:  Was the test performed?  No    Cognitive Function:    09/23/2021    9:58 AM  MMSE - Mini Mental State Exam  Orientation to time 5  Orientation to Place 5  Registration 3  Attention/ Calculation 5  Recall 3  Language- name 2 objects 2  Language- repeat 1  Language- follow 3 step command 3  Language- read & follow direction 1  Write a sentence 1  Copy design 0  Total score 29        01/19/2017    9:19 AM  6CIT Screen  What Year? 0 points  What month? 0 points  What time? 0 points  Count back from 20 0 points  Months in reverse 2 points  Repeat phrase 2 points  Total Score 4 points    Immunizations Immunization History  Administered Date(s) Administered   Fluad Quad(high Dose 65+) 06/28/2019, 09/12/2020, 07/28/2022   Influenza Split 08/23/2012   Influenza, High Dose Seasonal PF 09/09/2015, 07/14/2016, 07/26/2017, 09/06/2018   Influenza,inj,Quad PF,6+ Mos 09/07/2013   Influenza-Unspecified 09/15/2021   PFIZER(Purple Top)SARS-COV-2 Vaccination 01/01/2020, 01/22/2020, 09/12/2020   Pneumococcal Conjugate-13 01/01/2015   Pneumococcal Polysaccharide-23 01/07/2016   Td 04/28/2021   Tdap 07/29/2011   Zoster, Live 09/07/2013    TDAP status: Up to date  Flu Vaccine status: Up to date  Pneumococcal vaccine status: Up to date  Covid-19 vaccine status: Information provided on how to obtain vaccines.   Qualifies for Shingles Vaccine? Yes   Zostavax completed Yes   Shingrix Completed?: Yes  Screening Tests Health Maintenance  Topic Date Due   Zoster Vaccines- Shingrix (1 of 2) 06/21/1999   COVID-19 Vaccine (4 - 2024-25 season) 07/11/2023   FOOT EXAM  02/26/2024   INFLUENZA VACCINE  06/09/2024   OPHTHALMOLOGY EXAM  06/12/2024   HEMOGLOBIN A1C  06/18/2024   Diabetic kidney evaluation - eGFR measurement  12/19/2024   Diabetic kidney evaluation -  Urine ACR  12/21/2024   Medicare Annual Wellness (AWV)  04/20/2025   Colonoscopy  01/11/2030   DTaP/Tdap/Td (3 - Td or Tdap) 04/29/2031   Pneumococcal Vaccine: 50+ Years  Completed   Hepatitis C Screening  Completed   HPV VACCINES  Aged Out   Meningococcal B Vaccine  Aged Out    Health Maintenance  Health Maintenance Due  Topic  Date Due   Zoster Vaccines- Shingrix (1 of 2) 06/21/1999   COVID-19 Vaccine (4 - 2024-25 season) 07/11/2023   FOOT EXAM  02/26/2024    Colorectal cancer screening: Type of screening: Colonoscopy. Completed 2021. Repeat every 10 years  Lung Cancer Screening: (Low Dose CT Chest recommended if Age 55-80 years, 20 pack-year currently smoking OR have quit w/in 15years.) does not qualify.   Lung Cancer Screening Referral: not placed   Additional Screening:  Hepatitis C Screening: does qualify; no record of completion   Vision Screening: Recommended annual ophthalmology exams for early detection of glaucoma and other disorders of the eye. Is the patient up to date with their annual eye exam?  Yes  If pt is not established with a provider, would they like to be referred to a provider to establish care? No .   Dental Screening: Recommended annual dental exams for proper oral hygiene  Diabetic Foot Exam: Diabetic Foot Exam: Completed 04/20/24  Community Resource Referral / Chronic Care Management: CRR required this visit?  No   CCM required this visit?  No     Plan:     I have personally reviewed and noted the following in the patient's chart:   Medical and social history Use of alcohol, tobacco or illicit drugs  Current medications and supplements including opioid prescriptions. Patient is not currently taking opioid prescriptions. Functional ability and status Nutritional status Physical activity Advanced directives List of other physicians Hospitalizations, surgeries, and ER visits in previous 12 months Vitals Screenings to include cognitive,  depression, and falls Referrals and appointments   Assessment & Plan     Annual Wellness Visit Routine annual wellness visit for a 73 year old male. Discussed current health status, exercise, and diet. He remains active through daily activities such as mowing and fishing. Weight is 220 lbs. Blood work ordered to assess overall health status. - Order A1c, CMP, CBC, lipid panel, and vitamin D level - Perform foot exam  Diabetes Mellitus Diabetes mellitus managed with A1c monitoring and lifestyle modifications. Discussed the importance of regular exercise and dietary monitoring. He reports difficulty maintaining weight on Mounjaro  due to appetite suppression. - Order A1c test - Recommend 150 minutes of exercise weekly  Skin Lesion Intermittent breakout on the head, possibly related to sun exposure. Differential diagnosis includes sun damage or actinic keratosis. Referral to dermatology for further evaluation and management. Advised on sun protection to prevent potential precancerous changes. - Refer to dermatology for evaluation of skin lesion - Advise use of sunscreen, hat, and sunglasses for UV protection  General Health Maintenance Discussed vaccinations and preventive health measures. Recommended Shingrix vaccine for shingles and discussed COVID-19 booster. He declined COVID-19 booster but agreed to flu shot. - Administer second dose of Shingrix vaccine - Offer flu shot - Document decision to decline COVID-19 booster         In addition, I have reviewed and discussed with patient certain preventive protocols, quality metrics, and best practice recommendations. A written personalized care plan for preventive services as well as general preventive health recommendations were provided to patient.     Mimi Alt, MD   04/20/2024

## 2024-04-21 ENCOUNTER — Ambulatory Visit: Payer: Self-pay | Admitting: Family Medicine

## 2024-04-21 LAB — CBC

## 2024-04-23 LAB — CMP14+EGFR
ALT: 34 IU/L (ref 0–44)
AST: 22 IU/L (ref 0–40)
Albumin: 4.7 g/dL (ref 3.8–4.8)
Alkaline Phosphatase: 83 IU/L (ref 44–121)
BUN/Creatinine Ratio: 21 (ref 10–24)
BUN: 24 mg/dL (ref 8–27)
Bilirubin Total: 0.6 mg/dL (ref 0.0–1.2)
CO2: 21 mmol/L (ref 20–29)
Calcium: 9.8 mg/dL (ref 8.6–10.2)
Chloride: 101 mmol/L (ref 96–106)
Creatinine, Ser: 1.12 mg/dL (ref 0.76–1.27)
Globulin, Total: 2.4 g/dL (ref 1.5–4.5)
Glucose: 145 mg/dL — ABNORMAL HIGH (ref 70–99)
Potassium: 4.7 mmol/L (ref 3.5–5.2)
Sodium: 140 mmol/L (ref 134–144)
Total Protein: 7.1 g/dL (ref 6.0–8.5)
eGFR: 69 mL/min/{1.73_m2} (ref >59)

## 2024-04-23 LAB — LIPID PANEL
Chol/HDL Ratio: 3.5 ratio (ref 0.0–5.0)
Cholesterol, Total: 135 mg/dL (ref 100–199)
HDL: 39 mg/dL — ABNORMAL LOW (ref >39)
LDL Chol Calc (NIH): 68 mg/dL (ref 0–99)
Triglycerides: 163 mg/dL — ABNORMAL HIGH (ref 0–149)
VLDL Cholesterol Cal: 28 mg/dL (ref 5–40)

## 2024-04-23 LAB — CBC
Hematocrit: 50.5 % (ref 37.5–51.0)
Hemoglobin: 16.6 g/dL (ref 13.0–17.7)
MCH: 32.4 pg (ref 26.6–33.0)
MCHC: 32.9 g/dL (ref 31.5–35.7)
MCV: 99 fL — ABNORMAL HIGH (ref 79–97)
Platelets: 227 10*3/uL (ref 150–450)
RBC: 5.12 x10E6/uL (ref 4.14–5.80)
RDW: 13.6 % (ref 11.6–15.4)
WBC: 8.1 10*3/uL (ref 3.4–10.8)

## 2024-04-23 LAB — VITAMIN D 25 HYDROXY (VIT D DEFICIENCY, FRACTURES): Vit D, 25-Hydroxy: 32.5 ng/mL (ref 30.0–100.0)

## 2024-04-23 LAB — HEMOGLOBIN A1C
Est. average glucose Bld gHb Est-mCnc: 146 mg/dL
Hgb A1c MFr Bld: 6.7 % — ABNORMAL HIGH (ref 4.8–5.6)

## 2024-04-25 ENCOUNTER — Telehealth: Payer: Self-pay | Admitting: Family Medicine

## 2024-04-25 NOTE — Telephone Encounter (Signed)
 Spoke with the patient's wife and provided a detailed explanation regarding the potential for additional costs if issues beyond the standard physical exam are addressed during the visit. Clarified that [employer] covers one yearly physical exam. We offer the patient a courtesy option to proceed with additional evaluations or treatments during the visit, which may incur extra charges. The patient's wife expressed she was upset and stated she did not agree with the additional charges but acknowledged and understood the policy. She also shared that her husband loves Dr. Ardeth Beckers and that she will not make a fuss going forward.

## 2024-04-25 NOTE — Telephone Encounter (Signed)
 Copied from CRM [id number]. Topic: General - Other >> Apr 25, 2024 10:26 AM Carlatta H wrote: Reason for CRM: Patients wife would like a call back regarding some instructions that was giving for blood pressure at annual physical appointment//Patient was told that if he had additional question he would be charged more, with that the patient did not ask any additional questions//She would like to know the actual policies set in place//Please call Mariah Shines @ [phone] she is available until 3pm

## 2024-05-19 ENCOUNTER — Other Ambulatory Visit: Payer: Self-pay | Admitting: Family Medicine

## 2024-05-19 NOTE — Telephone Encounter (Signed)
 LOV 04/20/24 NOV 10/20/24 LRF 11/22/23 LABS 6/12/

## 2024-06-14 DIAGNOSIS — H40003 Preglaucoma, unspecified, bilateral: Secondary | ICD-10-CM | POA: Diagnosis not present

## 2024-06-14 DIAGNOSIS — H2513 Age-related nuclear cataract, bilateral: Secondary | ICD-10-CM | POA: Diagnosis not present

## 2024-06-14 DIAGNOSIS — E119 Type 2 diabetes mellitus without complications: Secondary | ICD-10-CM | POA: Diagnosis not present

## 2024-06-14 DIAGNOSIS — H5201 Hypermetropia, right eye: Secondary | ICD-10-CM | POA: Diagnosis not present

## 2024-06-14 DIAGNOSIS — H04123 Dry eye syndrome of bilateral lacrimal glands: Secondary | ICD-10-CM | POA: Diagnosis not present

## 2024-06-14 LAB — HM DIABETES EYE EXAM

## 2024-06-15 NOTE — Progress Notes (Signed)
 See media

## 2024-07-16 ENCOUNTER — Other Ambulatory Visit: Payer: Self-pay | Admitting: Family Medicine

## 2024-07-31 ENCOUNTER — Ambulatory Visit: Payer: Self-pay

## 2024-07-31 NOTE — Telephone Encounter (Signed)
 No triage completed Neither patient nor wife able to take call stating that they were in parking lot about to go into appointment. Further state that they will go to urgent care in the morning  Copied from CRM #[id number]. Topic: Clinical - Red Word Triage >> Jul 31, 2024 10:32 AM Sophia H wrote: Red Word that prompted transfer to Nurse Triage:  Patients wife Darice states patient has had a bad choking cough for the last 10 days along with wheezing making it hard to sleep, no other symptoms. Wife requesting call back, does not want to be transferred at this time. States she is in car driving - please reach out within the hour. [phone]

## 2024-08-01 DIAGNOSIS — J209 Acute bronchitis, unspecified: Secondary | ICD-10-CM | POA: Diagnosis not present

## 2024-08-01 DIAGNOSIS — R0689 Other abnormalities of breathing: Secondary | ICD-10-CM | POA: Diagnosis not present

## 2024-08-01 NOTE — Telephone Encounter (Signed)
 Patient call note and symptoms reviewed. Agree with urgent care evaluation if unable to come in to clinic for evaluation

## 2024-09-06 ENCOUNTER — Other Ambulatory Visit: Payer: Self-pay | Admitting: Family Medicine

## 2024-09-08 NOTE — Progress Notes (Signed)
 Pharmacy Quality Measure Review  This patient is appearing on a report for being at risk of failing the adherence measure for cholesterol (statin) medications this calendar year.   Medication: atorvastatin  Last fill date: 09/06/24 for 90 day supply  Insurance report was not up to date. No action needed at this time.   Kiri Hinderliter E. Marsh, PharmD, CPP Clinical Pharmacist [facility] [phone]

## 2024-09-22 ENCOUNTER — Telehealth: Payer: Self-pay

## 2024-09-22 DIAGNOSIS — I10 Essential (primary) hypertension: Secondary | ICD-10-CM

## 2024-09-22 MED ORDER — OLMESARTAN-AMLODIPINE-HCTZ 40-10-25 MG PO TABS: 1.0000 | ORAL_TABLET | Freq: Every day | ORAL | 1 refills | Status: DC

## 2024-09-22 NOTE — Progress Notes (Signed)
 Pharmacy Quality Measure Review  This patient is appearing on a report for being at risk of failing the adherence measure for hypertension (ACEi/ARB) medications this calendar year.   Medication: olmesartan /amlodipine /hydrochlorothiazide  Last fill date: 06/14/24 for 90 day supply  Will collaborate with provider to facilitate refill needs.  Jaspreet Bodner E. Marsh, PharmD, CPP Clinical Pharmacist [facility] [phone]

## 2024-10-20 ENCOUNTER — Ambulatory Visit: Admitting: Family Medicine

## 2024-10-20 VITALS — BP 111/52 | HR 53 | Resp 16 | Ht 70.0 in | Wt 222.0 lb

## 2024-10-20 DIAGNOSIS — M158 Other polyosteoarthritis: Secondary | ICD-10-CM

## 2024-10-20 DIAGNOSIS — E1165 Type 2 diabetes mellitus with hyperglycemia: Secondary | ICD-10-CM

## 2024-10-20 DIAGNOSIS — J4520 Mild intermittent asthma, uncomplicated: Secondary | ICD-10-CM | POA: Diagnosis not present

## 2024-10-20 DIAGNOSIS — M25512 Pain in left shoulder: Secondary | ICD-10-CM

## 2024-10-20 DIAGNOSIS — E1159 Type 2 diabetes mellitus with other circulatory complications: Secondary | ICD-10-CM | POA: Diagnosis not present

## 2024-10-20 DIAGNOSIS — K219 Gastro-esophageal reflux disease without esophagitis: Secondary | ICD-10-CM

## 2024-10-20 DIAGNOSIS — I152 Hypertension secondary to endocrine disorders: Secondary | ICD-10-CM | POA: Diagnosis not present

## 2024-10-20 DIAGNOSIS — E785 Hyperlipidemia, unspecified: Secondary | ICD-10-CM

## 2024-10-20 DIAGNOSIS — E559 Vitamin D deficiency, unspecified: Secondary | ICD-10-CM

## 2024-10-20 DIAGNOSIS — E1169 Type 2 diabetes mellitus with other specified complication: Secondary | ICD-10-CM

## 2024-10-20 DIAGNOSIS — Z7984 Long term (current) use of oral hypoglycemic drugs: Secondary | ICD-10-CM | POA: Diagnosis not present

## 2024-10-20 MED ORDER — TIZANIDINE HCL 4 MG PO TABS: 4.0000 mg | ORAL_TABLET | Freq: Four times a day (QID) | ORAL | 0 refills | Status: DC | PRN

## 2024-10-20 MED ORDER — TRAMADOL HCL 50 MG PO TABS: 50.0000 mg | ORAL_TABLET | Freq: Three times a day (TID) | ORAL | 0 refills | Status: AC | PRN

## 2024-10-20 NOTE — Patient Instructions (Signed)
 To keep you healthy, please keep in mind the following health maintenance items that you are due for:   Health Maintenance Due  Topic Date Due   Zoster Vaccines- Shingrix (1 of 2) 06/21/1999   COVID-19 Vaccine (4 - 2025-26 season) 07/10/2024   HEMOGLOBIN A1C  10/20/2024     Best Wishes,   Dr. Lang

## 2024-10-20 NOTE — Progress Notes (Signed)
 Established patient visit   Patient: Christian [name]   DOB: [birth year]   73 y.o. Male  MRN: [id number] Visit Date: 10/20/2024  Today's healthcare provider: Rockie Agent, MD   Chief Complaint  Patient presents with   Follow-up    6 month f/.. left shoulder pain( possible MRI/Xray)   Subjective     HPI     Follow-up    Additional comments: 6 month f/.. left shoulder pain( possible MRI/Xray)      Last edited by Marylen Odella LITTIE, CMA on 10/20/2024  8:11 AM.       Discussed the use of AI scribe software for clinical note transcription with the patient, who gave verbal consent to proceed.  History of Present Illness Christian [name] is a 73 year old male who presents with left shoulder pain.  He has been experiencing left shoulder pain since late August to early September after engaging in extensive weed eating and physical labor, which was more than his usual activity level. The pain is described as dull, located laterally and posteriorly, and is exacerbated by raising his arm. No falls or significant injuries, but he notes overuse as a potential cause. Aleve  at night provides some relief.  He is left-handed and mentions difficulty performing tasks that require lifting, such as picking up five-gallon buckets. He has not had any surgeries on his shoulders and denies any swelling or skin changes in the area.  His medication regimen includes olmesartan , amlodipine , hydrochlorothiazide  (40-10-25 mg daily), metformin  (1000 mg daily), Jardiance  (10 mg daily), vitamin D  (1000 units daily), atorvastatin  (40 mg daily), and aspirin  (81 mg daily).  During the review of symptoms, he reports a persistent cough and denies any new injuries to the shoulder. He has not had this type of shoulder pain before and attributes it to recent physical activities.     Past Medical History:  Diagnosis Date   Anxiety    Bradycardia    Hyperlipidemia    Hypertension     Type 2 diabetes mellitus (HCC)     Medications: Show/hide medication list[1]  Review of Systems  Last metabolic panel Lab Results  Component Value Date   GLUCOSE 132 (H) 10/20/2024   NA 136 10/20/2024   K 4.6 10/20/2024   CL 100 10/20/2024   CO2 25 10/20/2024   BUN 21 10/20/2024   CREATININE 1.25 10/20/2024   EGFR 60 10/20/2024   CALCIUM  9.9 10/20/2024   PROT 6.9 10/20/2024   ALBUMIN 4.8 10/20/2024   LABGLOB 2.1 10/20/2024   AGRATIO 2.1 12/24/2022   BILITOT 0.8 10/20/2024   ALKPHOS 81 10/20/2024   AST 14 10/20/2024   ALT 30 10/20/2024   Last lipids Lab Results  Component Value Date   CHOL 127 10/20/2024   HDL 39 (L) 10/20/2024   LDLCALC 69 10/20/2024   TRIG 104 10/20/2024   CHOLHDL 3.3 10/20/2024   Last hemoglobin A1c Lab Results  Component Value Date   HGBA1C 6.7 (H) 10/20/2024        Objective    BP (!) 111/52 (BP Location: Right Arm, Patient Position: Sitting, Cuff Size: Large)   Pulse (!) 53   Resp 16   Ht 5' 10 (1.778 m)   Wt 222 lb (100.7 kg)   SpO2 98%   BMI 31.85 kg/m   BP Readings from Last 3 Encounters:  10/20/24 (!) 111/52  04/20/24 (!) 113/58  12/22/23 123/67   Wt Readings from Last 3 Encounters:  10/20/24  222 lb (100.7 kg)  04/20/24 222 lb (100.7 kg)  12/22/23 219 lb 8 oz (99.6 kg)        Physical Exam  Physical Exam VITALS: BP- 111/52 CHEST: Lungs clear to auscultation bilaterally, intermittent cough. CARDIOVASCULAR: Irregular rhythm and bradycardia. ABDOMEN: Abdomen soft, normal bowel sounds. MUSCULOSKELETAL: Left shoulder abduction limited to 90%, positive Hawkins and empty can test on left shoulder, 5/5 strength in left shoulder with external and internal rotation, normal Neer's maneuver.    Results for orders placed or performed in visit on 10/20/24  Hemoglobin A1c  Result Value Ref Range   Hgb A1c MFr Bld 6.7 (H) 4.8 - 5.6 %   Est. average glucose Bld gHb Est-mCnc 146 mg/dL  RFE85+ZHQM  Result Value Ref Range    Glucose 132 (H) 70 - 99 mg/dL   BUN 21 8 - 27 mg/dL   Creatinine, Ser 8.74 0.76 - 1.27 mg/dL   eGFR 60 >40 fO/fpw/8.26   BUN/Creatinine Ratio 17 10 - 24   Sodium 136 134 - 144 mmol/L   Potassium 4.6 3.5 - 5.2 mmol/L   Chloride 100 96 - 106 mmol/L   CO2 25 20 - 29 mmol/L   Calcium  9.9 8.6 - 10.2 mg/dL   Total Protein 6.9 6.0 - 8.5 g/dL   Albumin 4.8 3.8 - 4.8 g/dL   Globulin, Total 2.1 1.5 - 4.5 g/dL   Bilirubin Total 0.8 0.0 - 1.2 mg/dL   Alkaline Phosphatase 81 47 - 123 IU/L   AST 14 0 - 40 IU/L   ALT 30 0 - 44 IU/L  Lipid panel  Result Value Ref Range   Cholesterol, Total 127 100 - 199 mg/dL   Triglycerides 895 0 - 149 mg/dL   HDL 39 (L) >60 mg/dL   VLDL Cholesterol Cal 19 5 - 40 mg/dL   LDL Chol Calc (NIH) 69 0 - 99 mg/dL   Chol/HDL Ratio 3.3 0.0 - 5.0 ratio    Assessment & Plan     Problem List Items Addressed This Visit     Acid reflux    Gastroesophageal reflux disease Chronic gastroesophageal reflux disease. Continue to manage symptoms with lifestyle       Avitaminosis D   Vitamin D  deficiency Chronic vitamin D  deficiency managed with supplementation. - Continue vitamin D  1000 units daily.      Essential hypertension   Hyperlipidemia associated with type 2 diabetes mellitus (HCC)   Chronic  - Encourage regular exercise to improve HDL levels - Continue atorvastatin  40 mg daily      Relevant Orders   Lipid panel (Completed)   Osteoarthrosis   Chronic  Involving mutliple joints  Meloxicam  7.5mg  prescribed to take daily, advised no additional NSAIDS while taking this prescription       Relevant Medications   tiZANidine  (ZANAFLEX ) 4 MG tablet   traMADol  (ULTRAM ) 50 MG tablet   meloxicam  (MOBIC ) 7.5 MG tablet   RAD (reactive airway disease)   Type 2 diabetes mellitus with hyperglycemia, without long-term current use of insulin  (HCC)   Type 2 diabetes mellitus with hyperglycemia, hypertension, and hyperlipidemia Chronic type 2 diabetes mellitus  managed with metformin  and Jardiance . Blood pressure is well-controlled on olmesartan , amlodipine , and hydrochlorothiazide . Hyperlipidemia managed with atorvastatin . Recent A1c was 6.7% in June, indicating good control. - Continue current medications: metformin  1000 mg daily, Jardiance  10 mg daily, olmesartan  40 mg daily, amlodipine  10 mg daily, hydrochlorothiazide  25 mg daily, atorvastatin  40 mg daily. - Ordered A1c, lipid panel, and CMP  to monitor diabetes and lipid levels.      Relevant Orders   Hemoglobin A1c (Completed)   Other Visit Diagnoses       Acute pain of left shoulder    -  Primary   Relevant Medications   tiZANidine  (ZANAFLEX ) 4 MG tablet   traMADol  (ULTRAM ) 50 MG tablet   meloxicam  (MOBIC ) 7.5 MG tablet   Other Relevant Orders   AMB referral to orthopedics       Assessment and Plan Assessment & Plan Left shoulder pain likely due to rotator cuff pathology Chronic left shoulder pain since late August or early September, likely due to overuse from physical activities such as weed eating. Pain is dull, located laterally and posteriorly, and exacerbated by abduction. No history of trauma or falls. Positive Hawkins and empty can tests suggest rotator cuff involvement. Differential includes tendonitis, arthritis, or a combination of both. No evidence of fracture or bony injury. - Referred to orthopedics for evaluation and possible imaging (x-ray, CT, or MRI). - Prescribed meloxicam  as an anti-inflammatory to replace Aleve . - Prescribed tramadol  50 mg every 12 hours as needed for pain. - Prescribed tizanidine  4 mg every 6 hours as needed for muscle relaxation. - Advised use of a sling for support.     Polyosteoarthritis Chronic polyosteoarthritis.     Return in about 6 months (around 04/20/2025) for AWV.         Rockie Agent, MD  [facility] [phone] (phone) [phone] (fax)  [facility]     [1]   Outpatient Medications Prior to Visit  Medication Sig   albuterol  (VENTOLIN  HFA) 108 (90 Base) MCG/ACT inhaler Inhale 2 puffs into the lungs every 4 (four) hours as needed for wheezing or shortness of breath.   aspirin  81 MG tablet Take 81 mg by mouth daily.    atorvastatin  (LIPITOR) 40 MG tablet TAKE 1 TABLET(40 MG) BY MOUTH AT BEDTIME   cholecalciferol  (VITAMIN D ) 1000 UNITS tablet Take 1,000 Units by mouth daily.    empagliflozin  (JARDIANCE ) 10 MG TABS tablet TAKE 1 TABLET BY MOUTH DAILY BEFORE BREAKFAST   glucose blood (CONTOUR NEXT TEST) test strip 1 each by Other route daily. Use as instructed   metFORMIN  (GLUCOPHAGE ) 500 MG tablet TAKE 2 TABLETS(1000 MG) BY MOUTH DAILY WITH BREAKFAST   MICROLET LANCETS MISC 1 each by Does not apply route daily.   Olmesartan -amLODIPine -HCTZ 40-10-25 MG TABS Take 1 tablet by mouth daily.   No facility-administered medications prior to visit.

## 2024-10-21 ENCOUNTER — Ambulatory Visit: Payer: Self-pay | Admitting: Family Medicine

## 2024-10-21 LAB — CMP14+EGFR
ALT: 30 IU/L (ref 0–44)
AST: 14 IU/L (ref 0–40)
Albumin: 4.8 g/dL (ref 3.8–4.8)
Alkaline Phosphatase: 81 IU/L (ref 47–123)
BUN/Creatinine Ratio: 17 (ref 10–24)
BUN: 21 mg/dL (ref 8–27)
Bilirubin Total: 0.8 mg/dL (ref 0.0–1.2)
CO2: 25 mmol/L (ref 20–29)
Calcium: 9.9 mg/dL (ref 8.6–10.2)
Chloride: 100 mmol/L (ref 96–106)
Creatinine, Ser: 1.25 mg/dL (ref 0.76–1.27)
Globulin, Total: 2.1 g/dL (ref 1.5–4.5)
Glucose: 132 mg/dL — ABNORMAL HIGH (ref 70–99)
Potassium: 4.6 mmol/L (ref 3.5–5.2)
Sodium: 136 mmol/L (ref 134–144)
Total Protein: 6.9 g/dL (ref 6.0–8.5)
eGFR: 60 mL/min/1.73 (ref >59)

## 2024-10-21 LAB — LIPID PANEL
Chol/HDL Ratio: 3.3 ratio (ref 0.0–5.0)
Cholesterol, Total: 127 mg/dL (ref 100–199)
HDL: 39 mg/dL — ABNORMAL LOW (ref >39)
LDL Chol Calc (NIH): 69 mg/dL (ref 0–99)
Triglycerides: 104 mg/dL (ref 0–149)
VLDL Cholesterol Cal: 19 mg/dL (ref 5–40)

## 2024-10-21 LAB — HEMOGLOBIN A1C
Est. average glucose Bld gHb Est-mCnc: 146 mg/dL
Hgb A1c MFr Bld: 6.7 % — ABNORMAL HIGH (ref 4.8–5.6)

## 2024-10-22 ENCOUNTER — Emergency Department

## 2024-10-22 ENCOUNTER — Other Ambulatory Visit: Payer: Self-pay

## 2024-10-22 ENCOUNTER — Observation Stay
Admission: EM | Admit: 2024-10-22 | Discharge: 2024-10-23 | Disposition: A | Discharge status: Still In Hospital | Attending: Family Medicine | Admitting: Family Medicine

## 2024-10-22 DIAGNOSIS — E785 Hyperlipidemia, unspecified: Secondary | ICD-10-CM | POA: Insufficient documentation

## 2024-10-22 DIAGNOSIS — R053 Chronic cough: Secondary | ICD-10-CM

## 2024-10-22 DIAGNOSIS — Z7982 Long term (current) use of aspirin: Secondary | ICD-10-CM | POA: Insufficient documentation

## 2024-10-22 DIAGNOSIS — R0602 Shortness of breath: Secondary | ICD-10-CM | POA: Diagnosis present

## 2024-10-22 DIAGNOSIS — E119 Type 2 diabetes mellitus without complications: Secondary | ICD-10-CM

## 2024-10-22 DIAGNOSIS — Z79899 Other long term (current) drug therapy: Secondary | ICD-10-CM | POA: Diagnosis not present

## 2024-10-22 DIAGNOSIS — Z6831 Body mass index (BMI) 31.0-31.9, adult: Secondary | ICD-10-CM | POA: Diagnosis not present

## 2024-10-22 DIAGNOSIS — Z1152 Encounter for screening for COVID-19: Secondary | ICD-10-CM | POA: Insufficient documentation

## 2024-10-22 DIAGNOSIS — J9601 Acute respiratory failure with hypoxia: Secondary | ICD-10-CM

## 2024-10-22 DIAGNOSIS — Z7984 Long term (current) use of oral hypoglycemic drugs: Secondary | ICD-10-CM | POA: Insufficient documentation

## 2024-10-22 DIAGNOSIS — R4182 Altered mental status, unspecified: Secondary | ICD-10-CM | POA: Diagnosis present

## 2024-10-22 DIAGNOSIS — E66811 Obesity, class 1: Secondary | ICD-10-CM | POA: Insufficient documentation

## 2024-10-22 DIAGNOSIS — I10 Essential (primary) hypertension: Secondary | ICD-10-CM | POA: Diagnosis not present

## 2024-10-22 DIAGNOSIS — M25512 Pain in left shoulder: Secondary | ICD-10-CM | POA: Insufficient documentation

## 2024-10-22 DIAGNOSIS — J189 Pneumonia, unspecified organism: Principal | ICD-10-CM | POA: Diagnosis present

## 2024-10-22 DIAGNOSIS — Z7901 Long term (current) use of anticoagulants: Secondary | ICD-10-CM | POA: Insufficient documentation

## 2024-10-22 LAB — CBC WITH DIFFERENTIAL/PLATELET
Abs Immature Granulocytes: 0.11 K/uL — ABNORMAL HIGH (ref 0.00–0.07)
Basophils Absolute: 0.1 K/uL (ref 0.0–0.1)
Basophils Relative: 0 %
Eosinophils Absolute: 0 K/uL (ref 0.0–0.5)
Eosinophils Relative: 0 %
HCT: 42.9 % (ref 39.0–52.0)
Hemoglobin: 14.5 g/dL (ref 13.0–17.0)
Immature Granulocytes: 1 %
Lymphocytes Relative: 3 %
Lymphs Abs: 0.5 K/uL — ABNORMAL LOW (ref 0.7–4.0)
MCH: 31.9 pg (ref 26.0–34.0)
MCHC: 33.8 g/dL (ref 30.0–36.0)
MCV: 94.3 fL (ref 80.0–100.0)
Monocytes Absolute: 1.3 K/uL — ABNORMAL HIGH (ref 0.1–1.0)
Monocytes Relative: 8 %
Neutro Abs: 15.5 K/uL — ABNORMAL HIGH (ref 1.7–7.7)
Neutrophils Relative %: 88 %
Platelets: 173 K/uL (ref 150–400)
RBC: 4.55 MIL/uL (ref 4.22–5.81)
RDW: 13.3 % (ref 11.5–15.5)
WBC: 17.6 K/uL — ABNORMAL HIGH (ref 4.0–10.5)
nRBC: 0 % (ref 0.0–0.2)

## 2024-10-22 LAB — URINALYSIS, W/ REFLEX TO CULTURE (INFECTION SUSPECTED)
Bacteria, UA: NONE SEEN
Bilirubin Urine: NEGATIVE
Glucose, UA: >=500 mg/dL — AB
Hgb urine dipstick: NEGATIVE
Ketones, ur: NEGATIVE mg/dL
Leukocytes,Ua: NEGATIVE
Nitrite: NEGATIVE
Protein, ur: 30 mg/dL — AB
Specific Gravity, Urine: 1.028 (ref 1.005–1.030)
Squamous Epithelial / HPF: 0 /HPF (ref 0–5)
pH: 5 (ref 5.0–8.0)

## 2024-10-22 LAB — RESP PANEL BY RT-PCR (RSV, FLU A&B, COVID)  RVPGX2
Influenza A by PCR: NEGATIVE
Influenza B by PCR: NEGATIVE
Resp Syncytial Virus by PCR: NEGATIVE
SARS Coronavirus 2 by RT PCR: NEGATIVE

## 2024-10-22 LAB — COMPREHENSIVE METABOLIC PANEL WITH GFR
ALT: 27 U/L (ref 0–44)
AST: 18 U/L (ref 15–41)
Albumin: 4.5 g/dL (ref 3.5–5.0)
Alkaline Phosphatase: 75 U/L (ref 38–126)
Anion gap: 15 (ref 5–15)
BUN: 23 mg/dL (ref 8–23)
CO2: 23 mmol/L (ref 22–32)
Calcium: 9.5 mg/dL (ref 8.9–10.3)
Chloride: 99 mmol/L (ref 98–111)
Creatinine, Ser: 1.15 mg/dL (ref 0.61–1.24)
GFR, Estimated: >60 mL/min (ref >60)
Glucose, Bld: 154 mg/dL — ABNORMAL HIGH (ref 70–99)
Potassium: 3.7 mmol/L (ref 3.5–5.1)
Sodium: 136 mmol/L (ref 135–145)
Total Bilirubin: 0.9 mg/dL (ref 0.0–1.2)
Total Protein: 7.3 g/dL (ref 6.5–8.1)

## 2024-10-22 LAB — LACTIC ACID, PLASMA: Lactic Acid, Venous: 1.3 mmol/L (ref 0.5–1.9)

## 2024-10-22 LAB — TROPONIN T, HIGH SENSITIVITY: Troponin T High Sensitivity: <15 ng/L (ref 0–19)

## 2024-10-22 LAB — PRO BRAIN NATRIURETIC PEPTIDE: Pro Brain Natriuretic Peptide: 234 pg/mL (ref <300.0)

## 2024-10-22 MED ORDER — SODIUM CHLORIDE 0.9 % IV SOLN
1.0000 g | Freq: Once | INTRAVENOUS | Status: AC
  Administered 2024-10-22: 1 g via INTRAVENOUS
  Filled 2024-10-22: qty 10

## 2024-10-22 MED ORDER — INSULIN ASPART 100 UNIT/ML IJ SOLN: 0.0000 [IU] | Freq: Every day | INTRAMUSCULAR | Status: DC

## 2024-10-22 MED ORDER — SODIUM CHLORIDE 0.9 % IV SOLN: 500.0000 mg | INTRAVENOUS | Status: DC

## 2024-10-22 MED ORDER — OLMESARTAN-AMLODIPINE-HCTZ 40-10-25 MG PO TABS: 1.0000 | ORAL_TABLET | Freq: Every day | ORAL | Status: DC

## 2024-10-22 MED ORDER — ONDANSETRON HCL 4 MG PO TABS: 4.0000 mg | ORAL_TABLET | Freq: Four times a day (QID) | ORAL | Status: DC | PRN

## 2024-10-22 MED ORDER — VITAMIN D 25 MCG (1000 UNIT) PO TABS
1000.0000 [IU] | ORAL_TABLET | Freq: Every day | ORAL | Status: DC
  Administered 2024-10-23: 09:00:00 1000 [IU] via ORAL
  Filled 2024-10-22: qty 1

## 2024-10-22 MED ORDER — INSULIN ASPART 100 UNIT/ML IJ SOLN
0.0000 [IU] | Freq: Three times a day (TID) | INTRAMUSCULAR | Status: DC
  Administered 2024-10-23: 08:00:00 1 [IU] via SUBCUTANEOUS
  Filled 2024-10-22: qty 2

## 2024-10-22 MED ORDER — SODIUM CHLORIDE 0.9 % IV BOLUS
1000.0000 mL | Freq: Once | INTRAVENOUS | Status: AC
  Administered 2024-10-22: 1000 mL via INTRAVENOUS

## 2024-10-22 MED ORDER — ONDANSETRON HCL 4 MG/2ML IJ SOLN: 4.0000 mg | Freq: Four times a day (QID) | INTRAMUSCULAR | Status: DC | PRN

## 2024-10-22 MED ORDER — HYDROCHLOROTHIAZIDE 25 MG PO TABS
25.0000 mg | ORAL_TABLET | Freq: Every day | ORAL | Status: DC
  Administered 2024-10-23: 09:00:00 25 mg via ORAL
  Filled 2024-10-22: qty 1

## 2024-10-22 MED ORDER — MELOXICAM 7.5 MG PO TABS: 7.5000 mg | ORAL_TABLET | Freq: Every day | ORAL | 0 refills | Status: DC

## 2024-10-22 MED ORDER — TRAZODONE HCL 50 MG PO TABS: 25.0000 mg | ORAL_TABLET | Freq: Every evening | ORAL | Status: DC | PRN

## 2024-10-22 MED ORDER — ATORVASTATIN CALCIUM 20 MG PO TABS
40.0000 mg | ORAL_TABLET | Freq: Every day | ORAL | Status: DC
  Administered 2024-10-22: 40 mg via ORAL
  Filled 2024-10-22: qty 2

## 2024-10-22 MED ORDER — ACETAMINOPHEN 650 MG RE SUPP: 650.0000 mg | Freq: Four times a day (QID) | RECTAL | Status: DC | PRN

## 2024-10-22 MED ORDER — EMPAGLIFLOZIN 10 MG PO TABS
10.0000 mg | ORAL_TABLET | Freq: Every day | ORAL | Status: DC
  Administered 2024-10-23: 09:00:00 10 mg via ORAL
  Filled 2024-10-22: qty 1

## 2024-10-22 MED ORDER — SODIUM CHLORIDE 0.9 % IV SOLN: INTRAVENOUS | Status: DC

## 2024-10-22 MED ORDER — IOHEXOL 350 MG/ML SOLN
75.0000 mL | Freq: Once | INTRAVENOUS | Status: AC | PRN
  Administered 2024-10-22: 75 mL via INTRAVENOUS

## 2024-10-22 MED ORDER — ASPIRIN 81 MG PO TBEC
81.0000 mg | DELAYED_RELEASE_TABLET | Freq: Every day | ORAL | Status: DC
  Administered 2024-10-23: 10:00:00 81 mg via ORAL
  Filled 2024-10-22: qty 1

## 2024-10-22 MED ORDER — ACETAMINOPHEN 325 MG PO TABS: 650.0000 mg | ORAL_TABLET | Freq: Four times a day (QID) | ORAL | Status: DC | PRN

## 2024-10-22 MED ORDER — MAGNESIUM HYDROXIDE 400 MG/5ML PO SUSP: 30.0000 mL | Freq: Every day | ORAL | Status: DC | PRN

## 2024-10-22 MED ORDER — SODIUM CHLORIDE 0.9 % IV SOLN
500.0000 mg | Freq: Once | INTRAVENOUS | Status: AC
  Administered 2024-10-22: 500 mg via INTRAVENOUS
  Filled 2024-10-22: qty 5

## 2024-10-22 MED ORDER — AMLODIPINE BESYLATE 5 MG PO TABS
10.0000 mg | ORAL_TABLET | Freq: Every day | ORAL | Status: DC
  Administered 2024-10-23: 09:00:00 10 mg via ORAL
  Filled 2024-10-22: qty 2

## 2024-10-22 MED ORDER — TIZANIDINE HCL 2 MG PO TABS: 4.0000 mg | ORAL_TABLET | Freq: Four times a day (QID) | ORAL | Status: DC | PRN

## 2024-10-22 MED ORDER — TRAMADOL HCL 50 MG PO TABS: 50.0000 mg | ORAL_TABLET | Freq: Three times a day (TID) | ORAL | Status: DC | PRN

## 2024-10-22 MED ORDER — SODIUM CHLORIDE 0.9 % IV SOLN: 2.0000 g | INTRAVENOUS | Status: DC

## 2024-10-22 MED ORDER — ENOXAPARIN SODIUM 60 MG/0.6ML IJ SOSY
50.0000 mg | PREFILLED_SYRINGE | INTRAMUSCULAR | Status: DC
  Administered 2024-10-23: 09:00:00 50 mg via SUBCUTANEOUS
  Filled 2024-10-22: qty 0.6

## 2024-10-22 MED ORDER — IRBESARTAN 150 MG PO TABS
300.0000 mg | ORAL_TABLET | Freq: Every day | ORAL | Status: DC
  Administered 2024-10-23: 09:00:00 300 mg via ORAL
  Filled 2024-10-22: qty 2

## 2024-10-22 NOTE — Assessment & Plan Note (Addendum)
-   The patient will be placed on supplement coverage with NovoLog . - Will continue Jardiance . - Will hold off metformin .

## 2024-10-22 NOTE — Assessment & Plan Note (Signed)
 -  Continue antihypertensive therapy ?

## 2024-10-22 NOTE — Progress Notes (Signed)
 PHARMACIST - PHYSICIAN COMMUNICATION  CONCERNING:  Enoxaparin  (Lovenox ) for DVT Prophylaxis    RECOMMENDATION: Patient was prescribed enoxaprin 40mg  q24 hours for VTE prophylaxis.   Filed Weights   10/22/24 1730  Weight: 100.6 kg (221 lb 12.5 oz)    Body mass index is 31.82 kg/m.  Estimated Creatinine Clearance: 65.9 mL/min (by C-G formula based on SCr of 1.15 mg/dL).   Based on [facility] policy patient is candidate for enoxaparin  0.5mg /kg TBW SQ every 24 hours based on BMI being >30.  DESCRIPTION: Pharmacy has adjusted enoxaparin  dose per [facility] policy.  Patient is now receiving enoxaparin  0.5 mg/kg every 24 hours   Rankin CANDIE Dills, PharmD, [facility] 10/22/2024 10:05 PM

## 2024-10-22 NOTE — Assessment & Plan Note (Signed)
°  Gastroesophageal reflux disease Chronic gastroesophageal reflux disease. Continue to manage symptoms with lifestyle

## 2024-10-22 NOTE — Assessment & Plan Note (Signed)
 Will continue statin therapy

## 2024-10-22 NOTE — ED Triage Notes (Signed)
 BIB by [facility] form home.  Patient 'not self today' confusion.  Alert to person. Confused to place and time. Unable to answer questions appropriately.    Hx COPD. Lungs diminished.  Also cough x 3-7 days. Recently started on Tramadol  on Friday patient has had three doses  Met EMS sepsis criteria T: 100.8 R: 26 146/86 90 CBG: 146  AAOx3. Skin warm and dry. NAD.  Denies complaint.

## 2024-10-22 NOTE — H&P (Signed)
 [place]   PATIENT NAME: Christian [name]    MR#:  [id number]  DATE OF BIRTH:  28-Feb-1951  DATE OF ADMISSION:  10/22/2024  PRIMARY CARE PHYSICIAN: Sharma Coyer, MD   Patient is coming from: Home  REQUESTING/REFERRING PHYSICIAN: Fernand Rossie HERO, MD    CHIEF COMPLAINT:   Chief Complaint  Patient presents with   Altered Mental Status    HISTORY OF PRESENT ILLNESS:  Christian [name] is a 73 y.o. Caucasian male with medical history significant for anxiety, hypertension, asthma, GERD, osteoarthritis, dyslipidemia and type 2 diabetes mellitus, who presented to the emergency room with acute onset of congested cough with inability to expectorate with associated dyspnea and wheezing without fever or chills.  No nausea or vomiting or abdominal pain.  No dysuria, oliguria or hematuria or flank pain.  No chest pain or palpitations.  No dysuria, oliguria or hematuria or flank pain.  Course: When the patient came to the ER, BP was 98/57 and later 117/67 pulse oximetry 98% on 3 L of O2 by nasal cannula, with otherwise normal vital signs.  CMP revealed blood glucose of 154.  High-sensitivity troponin T was less than 15 and proBNP 234.  Lactic acid was 1.3 CBC showed leukocytosis of 17.6 with neutrophilia.  Respiratory panel came back negative.  UA came back more than 500 glucose and was otherwise normal.   EKG as reviewed by me : None. Imaging: 2 view chest x-ray showed low lung volumes with bibasilar atelectasis and possible trace bilateral pleural effusions.  Chest CTA revealed the following: 1. Diffuse bronchitis with Bilateral lower lobe bronchopneumonia with a small additional airspace infiltrate in the posterior right middle lobe. Follow-up study recommended to ensure clearing after treatment. 2. No pulmonary embolism or right heart strain. Mildly prominent pulmonary trunk and main arteries. 3. Mild to moderate panchamber cardiomegaly with lipomatous hypertrophy of  the interatrial septum. 4. Mediastinal Lipomatosis. 5. Borderline prominent bilateral hilar lymph nodes, likely reactive. 6. Steatosis of the liver with mild prominence in the hepatic portal vein.   The patient was given IV Rocephin  and Zithromax  as well as 1 L bolus of IV normal saline.  He will be admitted to a medical telemetry bed for further evaluation and management.  PAST MEDICAL HISTORY:   Past Medical History:  Diagnosis Date   Anxiety    Bradycardia    Hyperlipidemia    Hypertension    Type 2 diabetes mellitus (HCC)     PAST SURGICAL HISTORY:   Past Surgical History:  Procedure Laterality Date   COLONOSCOPY WITH PROPOFOL  N/A 01/12/2020   Procedure: COLONOSCOPY WITH PROPOFOL ;  Surgeon: Jinny Carmine, MD;  Location: [facility] ENDOSCOPY;  Service: Endoscopy;  Laterality: N/A;   INGUINAL HERNIA REPAIR Left    vasectomy  1978    SOCIAL HISTORY:   Social History   Tobacco Use   Smoking status: Never   Smokeless tobacco: Current    Types: Snuff   Tobacco comments:    uses smokeless tobacco dip  Substance Use Topics   Alcohol use: Yes    Alcohol/week: 15.0 standard drinks of alcohol    Types: 15 Cans of beer per week    Comment: 3/day x 5    FAMILY HISTORY:   Family History  Problem Relation Age of Onset   Dementia Mother    Colon cancer Father    Hypertension Sister    Hypertension Son     DRUG ALLERGIES:  Allergies[1]  REVIEW OF SYSTEMS:  ROS As per history of present illness. All pertinent systems were reviewed above. Constitutional, HEENT, cardiovascular, respiratory, GI, GU, musculoskeletal, neuro, psychiatric, endocrine, integumentary and hematologic systems were reviewed and are otherwise negative/unremarkable except for positive findings mentioned above in the HPI.   MEDICATIONS AT HOME:   Prior to Admission medications  Medication Sig Start Date End Date Taking? Authorizing Provider  albuterol  (VENTOLIN  HFA) 108 (90 Base) MCG/ACT inhaler  Inhale 2 puffs into the lungs every 4 (four) hours as needed for wheezing or shortness of breath. 01/25/24   Simmons-Robinson, Rockie, MD  aspirin  81 MG tablet Take 81 mg by mouth daily.  02/11/12   [provider]  atorvastatin  (LIPITOR) 40 MG tablet TAKE 1 TABLET(40 MG) BY MOUTH AT BEDTIME 09/06/24   Simmons-Robinson, Makiera, MD  cholecalciferol  (VITAMIN D ) 1000 UNITS tablet Take 1,000 Units by mouth daily.  02/11/12   [provider]  empagliflozin  (JARDIANCE ) 10 MG TABS tablet TAKE 1 TABLET BY MOUTH DAILY BEFORE BREAKFAST 05/19/24   Simmons-Robinson, Makiera, MD  glucose blood (CONTOUR NEXT TEST) test strip 1 each by Other route daily. Use as instructed 10/17/18   Bertrum Charlie CROME, MD  meloxicam  (MOBIC ) 7.5 MG tablet Take 1 tablet (7.5 mg total) by mouth daily. 10/22/24   Simmons-Robinson, Makiera, MD  metFORMIN  (GLUCOPHAGE ) 500 MG tablet TAKE 2 TABLETS(1000 MG) BY MOUTH DAILY WITH BREAKFAST 07/17/24   Simmons-Robinson, Rockie, MD  MICROLET LANCETS MISC 1 each by Does not apply route daily. 09/06/18   Bertrum Charlie CROME, MD  Olmesartan -amLODIPine -HCTZ 40-10-25 MG TABS Take 1 tablet by mouth daily. 09/22/24   Simmons-Robinson, Rockie, MD  tiZANidine  (ZANAFLEX ) 4 MG tablet Take 1 tablet (4 mg total) by mouth every 6 (six) hours as needed for muscle spasms. 10/20/24   Simmons-Robinson, Rockie, MD  traMADol  (ULTRAM ) 50 MG tablet Take 1 tablet (50 mg total) by mouth every 8 (eight) hours as needed for up to 5 days. 10/20/24 10/25/24  Simmons-Robinson, Makiera, MD      VITAL SIGNS:  Blood pressure (!) 101/57, pulse (!) 55, temperature 99 F (37.2 C), temperature source Oral, resp. rate 18, weight 100.6 kg, SpO2 98%.  PHYSICAL EXAMINATION:  Physical Exam  GENERAL:  73 y.o.-year-old patient lying in the bed with no acute distress.  EYES: Pupils equal, round, reactive to light and accommodation. No scleral icterus. Extraocular muscles intact.  HEENT: Head atraumatic, normocephalic.  Oropharynx and nasopharynx clear.  NECK:  Supple, no jugular venous distention. No thyroid  enlargement, no tenderness.  LUNGS: Diminished bibasilar breath sounds with bibasal crackles. No use of accessory muscles of respiration.  CARDIOVASCULAR: Regular rate and rhythm, S1, S2 normal. No murmurs, rubs, or gallops.  ABDOMEN: Soft, nondistended, nontender. Bowel sounds present. No organomegaly or mass.  EXTREMITIES: No pedal edema, cyanosis, or clubbing.  NEUROLOGIC: Cranial nerves II through XII are intact. Muscle strength 5/5 in all extremities. Sensation intact. Gait not checked.  PSYCHIATRIC: The patient is alert and oriented x 3.  Normal affect and good eye contact. SKIN: No obvious rash, lesion, or ulcer.   LABORATORY PANEL:   CBC Recent Labs  Lab 10/22/24 1830  WBC 17.6*  HGB 14.5  HCT 42.9  PLT 173   ------------------------------------------------------------------------------------------------------------------  Chemistries  Recent Labs  Lab 10/22/24 1734  NA 136  K 3.7  CL 99  CO2 23  GLUCOSE 154*  BUN 23  CREATININE 1.15  CALCIUM  9.5  AST 18  ALT 27  ALKPHOS 75  BILITOT 0.9   ------------------------------------------------------------------------------------------------------------------  Cardiac  Enzymes No results for input(s): TROPONINI in the last 168 hours. ------------------------------------------------------------------------------------------------------------------  RADIOLOGY:  CT Angio Chest PE W and/or Wo Contrast Result Date: 10/22/2024 EXAM: CTA of the Chest with contrast for PE 10/22/2024 09:01:01 PM TECHNIQUE: CTA of the chest was performed after the administration of 75 mL of intravenous contrast (iohexol  (OMNIPAQUE ) 350 MG/ML injection 75 mL IOHEXOL  350 MG/ML SOLN). Multiplanar reformatted images are provided for review. MIP images are provided for review. Automated exposure control, iterative reconstruction, and/or weight based  adjustment of the mA/kV was utilized to reduce the radiation dose to as low as reasonably achievable. COMPARISON: AP and lateral chest from today and from 10/17/2020. No prior CT. CLINICAL HISTORY: Hypoxia, SOB, AMS. FINDINGS: PULMONARY ARTERIES: Pulmonary arteries are adequately opacified for evaluation. No arterial embolism is seen. There is a prominent pulmonary trunk measuring 3.2 cm and mild prominence of the left and right main pulmonary arteries are consistent with arterial hypertension. No right heart strain findings. MEDIASTINUM: There is mild to moderate panchamber cardiomegaly. No pericardial effusion. There are scattered single vessel calcifications in the proximal LAD coronary artery. There is lipomatous hypertrophy of the interatrial septum. The pulmonary veins are normal caliber. The aorta is tortuous with moderate calcific plaques, scattered calcification in the great vessels. There is no aneurysm, stenosis, or dissection. There is a 1.1 cm hypodense nodule in the right lobe of the thyroid  gland. No follow-up imaging is recommended. Mild Mediastinal lipomatosis is noted. LYMPH NODES: Bilateral borderline size hilar lymph nodes are present, likely reactive, but no enlarged thoracic lymph nodes by strict size criteria. LUNGS AND PLEURA: There is diffuse bronchial thickening. In the lower lobes there are scattered bilateral subsegmental bronchial impactions. There is bilateral patchy lower lobe peribronchovascular airspace consolidation in the basal segments consistent with bronchopneumonia. Small airspace infiltrate also in the posterior right middle lobe. There is mild elevation of the right hemidiaphragm. The upper lobes are clear. There is no pleural effusion or pneumothorax. UPPER ABDOMEN: In the abdomen the liver is mildly enlarged and steatotic. There is prominence of the hepatic portal vein measuring 15 mm, but no splenomegaly. Moderate fatty replacement of the pancreas, particularly the head  and neck portions. No acute upper abdominal findings. SOFT TISSUES AND BONES: There is multilevel thoracic spine bridging enthesopathy without acute or suspicious bone abnormality. There is mild arthrosis in both shoulders. No acute soft tissue abnormality. IMPRESSION: 1. Diffuse bronchitis with Bilateral lower lobe bronchopneumonia with a small additional airspace infiltrate in the posterior right middle lobe. Follow-up study recommended to ensure clearing after treatment. 2. No pulmonary embolism or right heart strain. Mildly prominent pulmonary trunk and main arteries. 3. Mild to moderate panchamber cardiomegaly with lipomatous hypertrophy of the interatrial septum. 4. Mediastinal Lipomatosis. 5. Borderline prominent bilateral hilar lymph nodes, likely reactive. 6. Steatosis of the liver with mild prominence in the hepatic portal vein. Electronically signed by: Francis Quam MD 10/22/2024 09:22 PM EST RP Workstation: [id number]   DG Chest 2 View Result Date: 10/22/2024 EXAM: 2 VIEW(S) XRAY OF THE CHEST 10/22/2024 05:50:33 PM COMPARISON: 10/17/2020 CLINICAL HISTORY: AMS (altered mental status) FINDINGS: LUNGS AND PLEURA: Low lung volumes with bibasilar atelectasis. Possible trace bilateral pleural effusions. No pneumothorax. HEART AND MEDIASTINUM: No acute abnormality of the cardiac and mediastinal silhouettes. BONES AND SOFT TISSUES: No acute osseous abnormality. IMPRESSION: 1. Low lung volumes with bibasilar atelectasis. 2. Possible trace bilateral pleural effusions. Electronically signed by: Pinkie Pebbles MD 10/22/2024 06:17 PM EST RP Workstation: [id number]  IMPRESSION AND PLAN:  Assessment and Plan: * CAP (community acquired pneumonia) - The patient will be admitted to a medical telemetry bed. - The patient has subsequent acute respiratory failure with hypoxia. - Will continue antibiotic therapy with IV Rocephin  and Zithromax . - Mucolytic therapy be provided as well as duo nebs q.i.d.  and q.4 hours p.r.n. - We will follow blood cultures.   Dyslipidemia - Will continue statin therapy.  Type 2 diabetes mellitus without complications (HCC) - The patient will be placed on supplement coverage with NovoLog . - Will continue Jardiance . - Will hold off metformin .  Essential hypertension - Continue antihypertensive therapy.    DVT prophylaxis: Lovenox .  Advanced Care Planning:  Code Status: full code.  Family Communication:  The plan of care was discussed in details with the patient (and family). I answered all questions. The patient agreed to proceed with the above mentioned plan. Further management will depend upon hospital course. Disposition Plan: Back to previous home environment Consults called: none.  All the records are reviewed and case discussed with ED provider.  Status is: Inpatient  At the time of the admission, it appears that the appropriate admission status for this patient is inpatient.  This is judged to be reasonable and necessary in order to provide the required intensity of service to ensure the patient's safety given the presenting symptoms, physical exam findings and initial radiographic and laboratory data in the context of comorbid conditions.  The patient requires inpatient status due to high intensity of service, high risk of further deterioration and high frequency of surveillance required.  I certify that at the time of admission, it is my clinical judgment that the patient will require inpatient hospital care extending more than 2 midnights.                            Dispo: The patient is from: Home              Anticipated d/c is to: Home              Patient currently is not medically stable to d/c.              Difficult to place patient: No  Madison DELENA Peaches M.D on 10/22/2024 at 11:56 PM  [facility]   From 7 PM-7 AM, contact night-coverage [email or web address]  CC: Primary care physician; Sharma Coyer, MD     [1] No  Known Allergies

## 2024-10-22 NOTE — Assessment & Plan Note (Signed)
 Type 2 diabetes mellitus with hyperglycemia, hypertension, and hyperlipidemia Chronic type 2 diabetes mellitus managed with metformin  and Jardiance . Blood pressure is well-controlled on olmesartan , amlodipine , and hydrochlorothiazide . Hyperlipidemia managed with atorvastatin . Recent A1c was 6.7% in June, indicating good control. - Continue current medications: metformin  1000 mg daily, Jardiance  10 mg daily, olmesartan  40 mg daily, amlodipine  10 mg daily, hydrochlorothiazide  25 mg daily, atorvastatin  40 mg daily. - Ordered A1c, lipid panel, and CMP to monitor diabetes and lipid levels.

## 2024-10-22 NOTE — Assessment & Plan Note (Signed)
 Chronic  - Encourage regular exercise to improve HDL levels - Continue atorvastatin  40 mg daily

## 2024-10-22 NOTE — Assessment & Plan Note (Addendum)
-   The patient will be admitted to a medical telemetry bed. - The patient has subsequent acute respiratory failure with hypoxia. - Will continue antibiotic therapy with IV Rocephin  and Zithromax. - Mucolytic therapy be provided as well as duo nebs q.i.d. and q.4 hours p.r.n. - We will follow blood cultures.

## 2024-10-22 NOTE — ED Provider Notes (Signed)
 [facility] Provider Note    Event Date/Time   First MD Initiated Contact with Patient 10/22/24 1747     (approximate)   History   Altered Mental Status   HPI  Christian [name] is a 73 y.o. male who presents today with concern of altered mental status shortness of breath and increased fatigue.  Apparently over the last 3 to 4 days having increased weakness and confusion.  It seems that he has chronic shoulder issues, his orthopedic doctor started him up on tramadol  and a muscle relaxer, since taking this seems that he has had some increased confusion and some trouble walking.  They have also noticed some fevers at home and a cough.  Patient has also seemingly been increasingly confused and difficult to arouse per family members who are at bedside and provide independent history.  Patient denies history of known cardiac disease no prior PEs or DVTs no swelling in his extremities.  No recent surgeries no known cancer history.     Physical Exam   Triage Vital Signs: ED Triage Vitals [10/22/24 1730]  Encounter Vitals Group     BP (!) 98/57     Girls Systolic BP Percentile      Girls Diastolic BP Percentile      Boys Systolic BP Percentile      Boys Diastolic BP Percentile      Pulse Rate 85     Resp 16     Temp 99.3 F (37.4 C)     Temp Source Oral     SpO2 98 %     Weight 221 lb 12.5 oz (100.6 kg)     Height      Head Circumference      Peak Flow      Pain Score 0     Pain Loc      Pain Education      Exclude from Growth Chart     Most recent vital signs: Vitals:   10/22/24 2030 10/22/24 2300  BP: 102/62 (!) 101/57  Pulse: 77 (!) 55  Resp: 18 18  Temp:  99 F (37.2 C)  SpO2: 98%      General: Awake, no distress.  CV:  Good peripheral perfusion.  Resp:  Normal effort.  Clear to auscultation bilaterally Abd:  No distention.  Other:     ED Results / Procedures / Treatments   Labs (all labs ordered are listed, but only abnormal  results are displayed) Labs Reviewed  COMPREHENSIVE METABOLIC PANEL WITH GFR - Abnormal; Notable for the following components:      Result Value   Glucose, Bld 154 (*)    All other components within normal limits  URINALYSIS, W/ REFLEX TO CULTURE (INFECTION SUSPECTED) - Abnormal; Notable for the following components:   Color, Urine AMBER (*)    APPearance HAZY (*)    Glucose, UA >=500 (*)    Protein, ur 30 (*)    All other components within normal limits  CBC WITH DIFFERENTIAL/PLATELET - Abnormal; Notable for the following components:   WBC 17.6 (*)    Neutro Abs 15.5 (*)    Lymphs Abs 0.5 (*)    Monocytes Absolute 1.3 (*)    Abs Immature Granulocytes 0.11 (*)    All other components within normal limits  RESP PANEL BY RT-PCR (RSV, FLU A&B, COVID)  RVPGX2  LACTIC ACID, PLASMA  PRO BRAIN NATRIURETIC PEPTIDE  LACTIC ACID, PLASMA  CBC WITH DIFFERENTIAL/PLATELET  BASIC METABOLIC PANEL  WITH GFR  CBC  TROPONIN T, HIGH SENSITIVITY     EKG     RADIOLOGY No acute cardiopulmonary findings on my independent interpretation  PROCEDURES:  Critical Care performed: No  Procedures   MEDICATIONS ORDERED IN ED: Medications  azithromycin  (ZITHROMAX ) 500 mg in sodium chloride  0.9 % 250 mL IVPB (500 mg Intravenous New Bag/Given 10/22/24 2332)  aspirin  EC tablet 81 mg (has no administration in time range)  traMADol  (ULTRAM ) tablet 50 mg (has no administration in time range)  atorvastatin  (LIPITOR) tablet 40 mg (40 mg Oral Given 10/22/24 2326)  empagliflozin  (JARDIANCE ) tablet 10 mg (has no administration in time range)  tiZANidine  (ZANAFLEX ) tablet 4 mg (has no administration in time range)  cholecalciferol  (VITAMIN D3) 25 MCG (1000 UNIT) tablet 1,000 Units (has no administration in time range)  enoxaparin  (LOVENOX ) injection 50 mg (has no administration in time range)  cefTRIAXone  (ROCEPHIN ) 2 g in sodium chloride  0.9 % 100 mL IVPB (has no administration in time range)  azithromycin   (ZITHROMAX ) 500 mg in sodium chloride  0.9 % 250 mL IVPB (has no administration in time range)  0.9 %  sodium chloride  infusion ( Intravenous New Bag/Given 10/22/24 2326)  acetaminophen  (TYLENOL ) tablet 650 mg (has no administration in time range)    Or  acetaminophen  (TYLENOL ) suppository 650 mg (has no administration in time range)  traZODone  (DESYREL ) tablet 25 mg (has no administration in time range)  magnesium  hydroxide (MILK OF MAGNESIA) suspension 30 mL (has no administration in time range)  ondansetron  (ZOFRAN ) tablet 4 mg (has no administration in time range)    Or  ondansetron  (ZOFRAN ) injection 4 mg (has no administration in time range)  irbesartan  (AVAPRO ) tablet 300 mg (has no administration in time range)    And  amLODipine  (NORVASC ) tablet 10 mg (has no administration in time range)    And  hydrochlorothiazide  (HYDRODIURIL ) tablet 25 mg (has no administration in time range)  sodium chloride  0.9 % bolus 1,000 mL (1,000 mLs Intravenous New Bag/Given 10/22/24 2012)  iohexol  (OMNIPAQUE ) 350 MG/ML injection 75 mL (75 mLs Intravenous Contrast Given 10/22/24 2051)  cefTRIAXone  (ROCEPHIN ) 1 g in sodium chloride  0.9 % 100 mL IVPB (0 g Intravenous Stopped 10/22/24 2219)  cefTRIAXone  (ROCEPHIN ) 1 g in sodium chloride  0.9 % 100 mL IVPB (0 g Intravenous Stopped 10/22/24 2303)     IMPRESSION / MDM / ASSESSMENT AND PLAN / ED COURSE  I reviewed the triage vital signs and the nursing notes.                               Patient's presentation is most consistent with acute complicated illness / injury requiring diagnostic workup.  73 year old male who presents today with concern of confusion and shortness of breath.  At rest he appears well he is making full sentences his vitals appear reassuring although oral temperature of 99.3.  He also has increased oxygen requirement currently on 3 L nasal cannula.  Given the presentation, concern for likely pneumonia versus possible URI resulting in  increased oxygen requirement.  Will likely warrant admission.  Will follow-up labs imaging and determine further workup accordingly.   Clinical Course as of 10/22/24 2351  Sun Oct 22, 2024  1836 Chest x-ray without clear explanation for symptomatology.  I do suspect likely infectious source, he has had fevers at home here oral temperature of 99.3.  Slightly hypotensive we will go ahead and initiate fluid resuscitation.  Viral  panel testing is negative. [SK]  2154 I spoke with Dr. Lawence from medicine team was agreed to evaluate the patient determine course for further medical management at this time. [SK]    Clinical Course User Index [SK] Fernand Rossie HERO, MD     FINAL CLINICAL IMPRESSION(S) / ED DIAGNOSES   Final diagnoses:  Acute pain of left shoulder  Essential hypertension     Rx / DC Orders   ED Discharge Orders     None        Note:  This document was prepared using Dragon voice recognition software and may include unintentional dictation errors.   Fernand Rossie HERO, MD 10/22/24 2351

## 2024-10-22 NOTE — Assessment & Plan Note (Signed)
 Chronic  Involving mutliple joints  Meloxicam  7.5mg  prescribed to take daily, advised no additional NSAIDS while taking this prescription

## 2024-10-22 NOTE — Assessment & Plan Note (Signed)
  Vitamin D  deficiency Chronic vitamin D  deficiency managed with supplementation. - Continue vitamin D  1000 units daily

## 2024-10-23 ENCOUNTER — Other Ambulatory Visit: Payer: Self-pay

## 2024-10-23 DIAGNOSIS — J189 Pneumonia, unspecified organism: Secondary | ICD-10-CM | POA: Diagnosis not present

## 2024-10-23 LAB — BASIC METABOLIC PANEL WITH GFR
Anion gap: 11 (ref 5–15)
BUN: 26 mg/dL — ABNORMAL HIGH (ref 8–23)
CO2: 24 mmol/L (ref 22–32)
Calcium: 8.4 mg/dL — ABNORMAL LOW (ref 8.9–10.3)
Chloride: 101 mmol/L (ref 98–111)
Creatinine, Ser: 1.26 mg/dL — ABNORMAL HIGH (ref 0.61–1.24)
GFR, Estimated: 59 mL/min — ABNORMAL LOW (ref >60)
Glucose, Bld: 122 mg/dL — ABNORMAL HIGH (ref 70–99)
Potassium: 4.3 mmol/L (ref 3.5–5.1)
Sodium: 137 mmol/L (ref 135–145)

## 2024-10-23 LAB — CBG MONITORING, ED
Glucose-Capillary: 123 mg/dL — ABNORMAL HIGH (ref 70–99)
Glucose-Capillary: 148 mg/dL — ABNORMAL HIGH (ref 70–99)

## 2024-10-23 LAB — CBC
HCT: 40.1 % (ref 39.0–52.0)
Hemoglobin: 13.6 g/dL (ref 13.0–17.0)
MCH: 32.2 pg (ref 26.0–34.0)
MCHC: 33.9 g/dL (ref 30.0–36.0)
MCV: 95 fL (ref 80.0–100.0)
Platelets: 174 K/uL (ref 150–400)
RBC: 4.22 MIL/uL (ref 4.22–5.81)
RDW: 13.5 % (ref 11.5–15.5)
WBC: 19.2 K/uL — ABNORMAL HIGH (ref 4.0–10.5)
nRBC: 0 % (ref 0.0–0.2)

## 2024-10-23 MED ORDER — AMOXICILLIN-POT CLAVULANATE 875-125 MG PO TABS
1.0000 | ORAL_TABLET | Freq: Two times a day (BID) | ORAL | 0 refills | Status: AC
  Filled 2024-10-23: qty 10, 5d supply, fill #0

## 2024-10-23 MED ORDER — AZITHROMYCIN 250 MG PO TABS
250.0000 mg | ORAL_TABLET | Freq: Every day | ORAL | 0 refills | Status: DC
  Filled 2024-10-23: qty 5, 5d supply, fill #0

## 2024-10-23 MED ORDER — BREZTRI AEROSPHERE 160-9-4.8 MCG/ACT IN AERO
2.0000 | INHALATION_SPRAY | Freq: Two times a day (BID) | RESPIRATORY_TRACT | 0 refills | Status: DC
  Filled 2024-10-23: qty 10.7, 30d supply, fill #0

## 2024-10-23 MED ORDER — ALBUTEROL SULFATE HFA 108 (90 BASE) MCG/ACT IN AERS
1.0000 | INHALATION_SPRAY | RESPIRATORY_TRACT | 2 refills | Status: AC | PRN
  Filled 2024-10-23: qty 18, 16d supply, fill #0

## 2024-10-23 NOTE — TOC CM/SW Note (Signed)
 Transition of Care [facility]) - Inpatient Brief Assessment   Patient Details  Name: Christian [name] MRN: [id number] Date of Birth: 1951/11/01  Transition of Care [facility]) CM/SW Contact:    [name], RN Phone Number: 10/23/2024, 9:55 AM   Clinical Narrative:   Transition of Care [facility]) Screening Note   Patient Details  Name: Christian [name] Date of Birth: [birth year]   Transition of Care [facility]) CM/SW Contact:    [name], RN Phone Number: 10/23/2024, 9:55 AM    Transition of Care Department [facility]) has reviewed patient and no TOC needs have been identified at this time. If new patient transition needs arise, please place a TOC consult.    Transition of Care Asessment: Insurance and Status: Insurance coverage has been reviewed Patient has primary care physician: Yes Home environment has been reviewed: Home with wife Prior level of function:: Independent Prior/Current Home Services: No current home services Social Drivers of Health Review: SDOH reviewed no interventions necessary Readmission risk has been reviewed: Yes Transition of care needs: no transition of care needs at this time

## 2024-10-23 NOTE — Care Management Obs Status (Signed)
 MEDICARE OBSERVATION STATUS NOTIFICATION   Patient Details  Name: Christian [name] MRN: [id number] Date of Birth: 1951/07/12   Medicare Observation Status Notification Given:  Yes    [name], RN 10/23/2024, 9:47 AM

## 2024-10-23 NOTE — Discharge Summary (Signed)
 Physician Discharge Summary   Patient: Christian [name] MRN: [id number]  DOB: 08-Jan-1951   Admit:     Date of Admission: 10/22/2024 Admitted from: home   Discharge: Date of discharge: 10/23/2024 Disposition: Home Condition at discharge: good  CODE STATUS: FULL CODE     Discharge Physician: Laneta Blunt, DO [facility]     PCP: Sharma Coyer, MD  Recommendations for Outpatient Follow-up:  Follow up with PCP Simmons-Robinson, Makiera, MD in 1-2 weeks   Discharge Instructions     Increase activity slowly   Complete by: As directed          Discharge Diagnoses: Principal Problem:   CAP (community acquired pneumonia) Active Problems:   Essential hypertension   Type 2 diabetes mellitus without complications [facility])   Dyslipidemia      Hospital course / significant events:   [name] is a 73 y.o. Caucasian male with medical history significant for anxiety, hypertension, asthma, GERD, osteoarthritis, dyslipidemia and type 2 diabetes mellitus, who presented to the emergency room with acute onset of congested cough with inability to expectorate with associated dyspnea and wheezing without fever or chills. Admitted for bronchitis / CAP requiring O2 supplementation. Immproved into the next morning and off supplemental oxygen, stable for discharge on po antibiotics       Consultants:  none  Procedures/Surgeries: none      ASSESSMENT & PLAN:   CAP Augmentin  + azithro See med rec Consider follow up CXR outpatinet   HLD DM2 HTN Continue home medications  Follow w/ PCP     Class 1 obesity based on BMI: Body mass index is 31.82 kg/m.SABRA Significantly low or high BMI is associated with higher medical risk.  Underweight - under 18  overweight - 25 to 29 obese - 30 or more Class 1 obesity: BMI of 30.0 to 34 Class 2 obesity: BMI of 35.0 to 39 Class 3 obesity: BMI of 40.0 to 49 Super Morbid Obesity: BMI  50-59 Super-super Morbid Obesity: BMI 60+ Healthy nutrition and physical activity advised as adjunct to other disease management and risk reduction treatments             Discharge Instructions  Allergies as of 10/23/2024   No Known Allergies      Medication List     PAUSE taking these medications    Olmesartan -amLODIPine -HCTZ 40-10-25 MG Tabs Wait to take this until your doctor or other care provider tells you to start again. Take 1 tablet by mouth daily.       TAKE these medications    albuterol  108 (90 Base) MCG/ACT inhaler Commonly known as: VENTOLIN  HFA Inhale 1-2 puffs into the lungs every 4 (four) hours as needed for wheezing or shortness of breath. What changed: how much to take   amoxicillin -clavulanate 875-125 MG tablet Commonly known as: AUGMENTIN  Take 1 tablet by mouth 2 (two) times daily for 5 days.   aspirin  81 MG tablet Take 81 mg by mouth daily.   atorvastatin  40 MG tablet Commonly known as: LIPITOR TAKE 1 TABLET(40 MG) BY MOUTH AT BEDTIME   azithromycin  250 MG tablet Commonly known as: Zithromax  Z-Pak Take 1 tablet (250 mg total) by mouth daily.   Breztri  Aerosphere 160-9-4.8 MCG/ACT Aero inhaler Generic drug: budesonide -glycopyrrolate -formoterol  Inhale 2 puffs into the lungs in the morning and at bedtime.   cholecalciferol  1000 units tablet Commonly known as: VITAMIN D  Take 1,000 Units by mouth daily.   glucose blood test strip Commonly known as: Contour  Next Test 1 each by Other route daily. Use as instructed   Jardiance  10 MG Tabs tablet Generic drug: empagliflozin  TAKE 1 TABLET BY MOUTH DAILY BEFORE BREAKFAST   meloxicam  7.5 MG tablet Commonly known as: MOBIC  Take 1 tablet (7.5 mg total) by mouth daily.   metFORMIN  500 MG tablet Commonly known as: GLUCOPHAGE  TAKE 2 TABLETS(1000 MG) BY MOUTH DAILY WITH BREAKFAST   Microlet Lancets Misc 1 each by Does not apply route daily.   tiZANidine  4 MG tablet Commonly known  as: Zanaflex  Take 1 tablet (4 mg total) by mouth every 6 (six) hours as needed for muscle spasms.   traMADol  50 MG tablet Commonly known as: ULTRAM  Take 1 tablet (50 mg total) by mouth every 8 (eight) hours as needed for up to 5 days.          Allergies[1]   Subjective: pt reports feeling improved this mornong, still come cough, no additional concerns   Discharge Exam: BP 101/66   Pulse (!) 55   Temp 98.2 F (36.8 C) (Oral)   Resp 16   Wt 100.6 kg   SpO2 92%   BMI 31.82 kg/m   General: Pt is alert, awake, not in acute distress Cardiovascular: RRR, S1/S2 +, no rubs, no gallops Respiratory: scatterdd faint wheezing, no rhonchi Abdominal: Soft, NT, ND, bowel sounds + Extremities: no edema, no cyanosis     The results of significant diagnostics from this hospitalization (including imaging, microbiology, ancillary and laboratory) are listed below for reference.     Microbiology: Recent Results (from the past 240 hours)  Resp panel by RT-PCR (RSV, Flu A&B, Covid) Anterior Nasal Swab     Status: None   Collection Time: 10/22/24  5:38 PM   Specimen: Anterior Nasal Swab  Result Value Ref Range Status   SARS Coronavirus 2 by RT PCR NEGATIVE NEGATIVE Final    Comment: (NOTE) SARS-CoV-2 target nucleic acids are NOT DETECTED.  The SARS-CoV-2 RNA is generally detectable in upper respiratory specimens during the acute phase of infection. The lowest concentration of SARS-CoV-2 viral copies this assay can detect is 138 copies/mL. A negative result does not preclude SARS-Cov-2 infection and should not be used as the sole basis for treatment or other patient management decisions. A negative result may occur with  improper specimen collection/handling, submission of specimen other than nasopharyngeal swab, presence of viral mutation(s) within the areas targeted by this assay, and inadequate number of viral copies(<138 copies/mL). A negative result must be combined  with clinical observations, patient history, and epidemiological information. The expected result is Negative.  Fact Sheet for Patients:  bloggercourse.com  Fact Sheet for Healthcare Providers:  seriousbroker.it  This test is no t yet approved or cleared by the [place]  FDA and  has been authorized for detection and/or diagnosis of SARS-CoV-2 by FDA under an Emergency Use Authorization (EUA). This EUA will remain  in effect (meaning this test can be used) for the duration of the COVID-19 declaration under Section 564(b)(1) of the Act, 21 U.S.C.section 360bbb-3(b)(1), unless the authorization is terminated  or revoked sooner.       Influenza A by PCR NEGATIVE NEGATIVE Final   Influenza B by PCR NEGATIVE NEGATIVE Final    Comment: (NOTE) The Xpert Xpress SARS-CoV-2/FLU/RSV plus assay is intended as an aid in the diagnosis of influenza from Nasopharyngeal swab specimens and should not be used as a sole basis for treatment. Nasal washings and aspirates are unacceptable for Xpert Xpress SARS-CoV-2/FLU/RSV testing.  Fact  Sheet for Patients: bloggercourse.com  Fact Sheet for Healthcare Providers: seriousbroker.it  This test is not yet approved or cleared by the [place]  FDA and has been authorized for detection and/or diagnosis of SARS-CoV-2 by FDA under an Emergency Use Authorization (EUA). This EUA will remain in effect (meaning this test can be used) for the duration of the COVID-19 declaration under Section 564(b)(1) of the Act, 21 U.S.C. section 360bbb-3(b)(1), unless the authorization is terminated or revoked.     Resp Syncytial Virus by PCR NEGATIVE NEGATIVE Final    Comment: (NOTE) Fact Sheet for Patients: bloggercourse.com  Fact Sheet for Healthcare Providers: seriousbroker.it  This test is not yet approved  or cleared by the [place]  FDA and has been authorized for detection and/or diagnosis of SARS-CoV-2 by FDA under an Emergency Use Authorization (EUA). This EUA will remain in effect (meaning this test can be used) for the duration of the COVID-19 declaration under Section 564(b)(1) of the Act, 21 U.S.C. section 360bbb-3(b)(1), unless the authorization is terminated or revoked.  Performed at [facility], [address] [place], [place] [address]      Labs: BNP (last 3 results) No results for input(s): BNP in the last 8760 hours. Basic Metabolic Panel: Recent Labs  Lab 10/20/24 0857 10/22/24 1734 10/23/24 0401  NA 136 136 137  K 4.6 3.7 4.3  CL 100 99 101  CO2 25 23 24   GLUCOSE 132* 154* 122*  BUN 21 23 26*  CREATININE 1.25 1.15 1.26*  CALCIUM  9.9 9.5 8.4*   Liver Function Tests: Recent Labs  Lab 10/20/24 0857 10/22/24 1734  AST 14 18  ALT 30 27  ALKPHOS 81 75  BILITOT 0.8 0.9  PROT 6.9 7.3  ALBUMIN 4.8 4.5   No results for input(s): LIPASE, AMYLASE in the last 168 hours. No results for input(s): AMMONIA in the last 168 hours. CBC: Recent Labs  Lab 10/22/24 1830 10/23/24 0401  WBC 17.6* 19.2*  NEUTROABS 15.5*  --   HGB 14.5 13.6  HCT 42.9 40.1  MCV 94.3 95.0  PLT 173 174   Cardiac Enzymes: No results for input(s): CKTOTAL, CKMB, CKMBINDEX, TROPONINI in the last 168 hours. BNP: Invalid input(s): POCBNP CBG: Recent Labs  Lab 10/23/24 0007 10/23/24 0801  GLUCAP 148* 123*   D-Dimer No results for input(s): DDIMER in the last 72 hours. Hgb A1c No results for input(s): HGBA1C in the last 72 hours. Lipid Profile No results for input(s): CHOL, HDL, LDLCALC, TRIG, CHOLHDL, LDLDIRECT in the last 72 hours. Thyroid  function studies No results for input(s): TSH, T4TOTAL, T3FREE, THYROIDAB in the last 72 hours.  Invalid input(s): FREET3 Anemia work up No results for input(s): VITAMINB12,  FOLATE, FERRITIN, TIBC, IRON, RETICCTPCT in the last 72 hours. Urinalysis    Component Value Date/Time   COLORURINE AMBER (A) 10/22/2024 1733   APPEARANCEUR HAZY (A) 10/22/2024 1733   LABSPEC 1.028 10/22/2024 1733   PHURINE 5.0 10/22/2024 1733   GLUCOSEU >=500 (A) 10/22/2024 1733   HGBUR NEGATIVE 10/22/2024 1733   BILIRUBINUR NEGATIVE 10/22/2024 1733   BILIRUBINUR neg 01/25/2018 1014   KETONESUR NEGATIVE 10/22/2024 1733   PROTEINUR 30 (A) 10/22/2024 1733   UROBILINOGEN 0.2 01/25/2018 1014   NITRITE NEGATIVE 10/22/2024 1733   LEUKOCYTESUR NEGATIVE 10/22/2024 1733   Sepsis Labs Recent Labs  Lab 10/22/24 1830 10/23/24 0401  WBC 17.6* 19.2*   Microbiology Recent Results (from the past 240 hours)  Resp panel by RT-PCR (RSV, Flu A&B, Covid) Anterior Nasal Swab  Status: None   Collection Time: 10/22/24  5:38 PM   Specimen: Anterior Nasal Swab  Result Value Ref Range Status   SARS Coronavirus 2 by RT PCR NEGATIVE NEGATIVE Final    Comment: (NOTE) SARS-CoV-2 target nucleic acids are NOT DETECTED.  The SARS-CoV-2 RNA is generally detectable in upper respiratory specimens during the acute phase of infection. The lowest concentration of SARS-CoV-2 viral copies this assay can detect is 138 copies/mL. A negative result does not preclude SARS-Cov-2 infection and should not be used as the sole basis for treatment or other patient management decisions. A negative result may occur with  improper specimen collection/handling, submission of specimen other than nasopharyngeal swab, presence of viral mutation(s) within the areas targeted by this assay, and inadequate number of viral copies(<138 copies/mL). A negative result must be combined with clinical observations, patient history, and epidemiological information. The expected result is Negative.  Fact Sheet for Patients:  bloggercourse.com  Fact Sheet for Healthcare Providers:   seriousbroker.it  This test is no t yet approved or cleared by the [place]  FDA and  has been authorized for detection and/or diagnosis of SARS-CoV-2 by FDA under an Emergency Use Authorization (EUA). This EUA will remain  in effect (meaning this test can be used) for the duration of the COVID-19 declaration under Section 564(b)(1) of the Act, 21 U.S.C.section 360bbb-3(b)(1), unless the authorization is terminated  or revoked sooner.       Influenza A by PCR NEGATIVE NEGATIVE Final   Influenza B by PCR NEGATIVE NEGATIVE Final    Comment: (NOTE) The Xpert Xpress SARS-CoV-2/FLU/RSV plus assay is intended as an aid in the diagnosis of influenza from Nasopharyngeal swab specimens and should not be used as a sole basis for treatment. Nasal washings and aspirates are unacceptable for Xpert Xpress SARS-CoV-2/FLU/RSV testing.  Fact Sheet for Patients: bloggercourse.com  Fact Sheet for Healthcare Providers: seriousbroker.it  This test is not yet approved or cleared by the [place]  FDA and has been authorized for detection and/or diagnosis of SARS-CoV-2 by FDA under an Emergency Use Authorization (EUA). This EUA will remain in effect (meaning this test can be used) for the duration of the COVID-19 declaration under Section 564(b)(1) of the Act, 21 U.S.C. section 360bbb-3(b)(1), unless the authorization is terminated or revoked.     Resp Syncytial Virus by PCR NEGATIVE NEGATIVE Final    Comment: (NOTE) Fact Sheet for Patients: bloggercourse.com  Fact Sheet for Healthcare Providers: seriousbroker.it  This test is not yet approved or cleared by the [place]  FDA and has been authorized for detection and/or diagnosis of SARS-CoV-2 by FDA under an Emergency Use Authorization (EUA). This EUA will remain in effect (meaning this test can be used) for  the duration of the COVID-19 declaration under Section 564(b)(1) of the Act, 21 U.S.C. section 360bbb-3(b)(1), unless the authorization is terminated or revoked.  Performed at [facility], [address] [place], [place] [address]    Imaging CT Angio Chest PE W and/or Wo Contrast Result Date: 10/22/2024 EXAM: CTA of the Chest with contrast for PE 10/22/2024 09:01:01 PM TECHNIQUE: CTA of the chest was performed after the administration of 75 mL of intravenous contrast (iohexol  (OMNIPAQUE ) 350 MG/ML injection 75 mL IOHEXOL  350 MG/ML SOLN). Multiplanar reformatted images are provided for review. MIP images are provided for review. Automated exposure control, iterative reconstruction, and/or weight based adjustment of the mA/kV was utilized to reduce the radiation dose to as low as reasonably achievable. COMPARISON: AP and lateral chest  from today and from 10/17/2020. No prior CT. CLINICAL HISTORY: Hypoxia, SOB, AMS. FINDINGS: PULMONARY ARTERIES: Pulmonary arteries are adequately opacified for evaluation. No arterial embolism is seen. There is a prominent pulmonary trunk measuring 3.2 cm and mild prominence of the left and right main pulmonary arteries are consistent with arterial hypertension. No right heart strain findings. MEDIASTINUM: There is mild to moderate panchamber cardiomegaly. No pericardial effusion. There are scattered single vessel calcifications in the proximal LAD coronary artery. There is lipomatous hypertrophy of the interatrial septum. The pulmonary veins are normal caliber. The aorta is tortuous with moderate calcific plaques, scattered calcification in the great vessels. There is no aneurysm, stenosis, or dissection. There is a 1.1 cm hypodense nodule in the right lobe of the thyroid  gland. No follow-up imaging is recommended. Mild Mediastinal lipomatosis is noted. LYMPH NODES: Bilateral borderline size hilar lymph nodes are present, likely reactive, but no enlarged thoracic  lymph nodes by strict size criteria. LUNGS AND PLEURA: There is diffuse bronchial thickening. In the lower lobes there are scattered bilateral subsegmental bronchial impactions. There is bilateral patchy lower lobe peribronchovascular airspace consolidation in the basal segments consistent with bronchopneumonia. Small airspace infiltrate also in the posterior right middle lobe. There is mild elevation of the right hemidiaphragm. The upper lobes are clear. There is no pleural effusion or pneumothorax. UPPER ABDOMEN: In the abdomen the liver is mildly enlarged and steatotic. There is prominence of the hepatic portal vein measuring 15 mm, but no splenomegaly. Moderate fatty replacement of the pancreas, particularly the head and neck portions. No acute upper abdominal findings. SOFT TISSUES AND BONES: There is multilevel thoracic spine bridging enthesopathy without acute or suspicious bone abnormality. There is mild arthrosis in both shoulders. No acute soft tissue abnormality. IMPRESSION: 1. Diffuse bronchitis with Bilateral lower lobe bronchopneumonia with a small additional airspace infiltrate in the posterior right middle lobe. Follow-up study recommended to ensure clearing after treatment. 2. No pulmonary embolism or right heart strain. Mildly prominent pulmonary trunk and main arteries. 3. Mild to moderate panchamber cardiomegaly with lipomatous hypertrophy of the interatrial septum. 4. Mediastinal Lipomatosis. 5. Borderline prominent bilateral hilar lymph nodes, likely reactive. 6. Steatosis of the liver with mild prominence in the hepatic portal vein. Electronically signed by: Francis Quam MD 10/22/2024 09:22 PM EST RP Workstation: [id number]   DG Chest 2 View Result Date: 10/22/2024 EXAM: 2 VIEW(S) XRAY OF THE CHEST 10/22/2024 05:50:33 PM COMPARISON: 10/17/2020 CLINICAL HISTORY: AMS (altered mental status) FINDINGS: LUNGS AND PLEURA: Low lung volumes with bibasilar atelectasis. Possible trace bilateral  pleural effusions. No pneumothorax. HEART AND MEDIASTINUM: No acute abnormality of the cardiac and mediastinal silhouettes. BONES AND SOFT TISSUES: No acute osseous abnormality. IMPRESSION: 1. Low lung volumes with bibasilar atelectasis. 2. Possible trace bilateral pleural effusions. Electronically signed by: Pinkie Pebbles MD 10/22/2024 06:17 PM EST RP Workstation: [id number]      Time coordinating discharge: over 30 minutes  SIGNED:  Amra Shukla DO [facility]       [1] No Known Allergies

## 2024-10-23 NOTE — Discharge Instructions (Signed)
 Over-the-Counter Medications & Home Remedies for Respiratory Illness  Note: the following list assumes no pregnancy, normal liver & kidney function and no other drug interactions. Dr. Marsa has highlighted medications which are safe for you to use, but these may not be appropriate for everyone. Always ask a pharmacist or qualified medical provider if you have any questions!   Aches/Pains, Fever, Headache Acetaminophen  (Tylenol ) 500 mg tablets - take max 2 tablets (1000 mg) every 6 hours (4 times per day)  Ibuprofen (Motrin) 200 mg tablets - take max 4 tablets (800 mg) every 6 hours*  Sinus Congestion Nasal Saline if desired Oxymetolazone (Afrin, others) sparing use due to rebound congestion, NEVER use in kids Phenylephrine (Sudafed) 10 mg tablets every 4 hours (or the 12-hour formulation)* Diphenhydramine (Benadryl) 25 mg tablets - take max 2 tablets every 4 hours  Cough & Sore Throat Dextromethorphan (Robitussin, others) - cough suppressant Guaifenesin (Robitussin, Mucinex, others) - expectorant (helps cough up mucus) (Dextromethorphan and Guaifenesin also come in a combination tablet) Lozenges w/ Benzocaine + Menthol (Cepacol) Honey - as much as you want! Teas which coat the throat - look for ingredients Elm Bark, Licorice Root, Marshmallow Root  Other Antibiotics if these are prescribed - take ALL, even if you're feeling better  Zinc Lozenges within 24 hours of symptoms onset - mixed evidence this shortens the duration of the common cold Don't waste your money on Vitamin C or Echinacea  *Caution in patients with high blood pressure

## 2024-10-23 NOTE — Care Management CC44 (Signed)
 Condition Code 44 Documentation Completed  Patient Details  Name: KHAYMAN KIRSCH MRN: [id number] Date of Birth: 04/24/1951   Condition Code 44 given:  Yes Patient signature on Condition Code 44 notice:  Yes Documentation of 2 MD's agreement:  Yes Code 44 added to claim:  Yes    [name], RN 10/23/2024, 9:49 AM

## 2024-10-24 ENCOUNTER — Ambulatory Visit: Payer: Self-pay

## 2024-10-24 ENCOUNTER — Telehealth: Payer: Self-pay

## 2024-10-24 NOTE — Transitions of Care (Post Inpatient/ED Visit) (Unsigned)
° °  10/24/2024  Name: Christian [name] MRN: [id number] DOB: [birth year]  Today's TOC FU Call Status: Today's TOC FU Call Status:: Unsuccessful Call (1st Attempt) Unsuccessful Call (1st Attempt) Date: 10/24/24  Attempted to reach the patient regarding the most recent Inpatient/ED visit.  Follow Up Plan: Additional outreach attempts will be made to reach the patient to complete the Transitions of Care (Post Inpatient/ED visit) call.   Signature  Charmaine Bloodgood, LPN [facility] Advisor [place] [facility] You Are. We Are. [facility] Direct Dial [phone]

## 2024-10-24 NOTE — Telephone Encounter (Signed)
 FYI Only or Action Required?: Action required by provider: clinical question for provider.  Patient was last seen in primary care on 10/20/2024 by Sharma Coyer, MD.  Called Nurse Triage reporting Cough.  Symptoms began yesterday.  Interventions attempted: Prescription medications: See MAR and Rest, hydration, or home remedies.  Symptoms are: unchanged.  Triage Disposition: See Physician Within 24 Hours  Patient/caregiver understands and will follow disposition?: No, wishes to speak with PCP  Message from Berwyn MATSU sent at 10/24/2024  8:29 AM EST  Reason for Triage: coughing up mucus/gunk  that looks black patient was released from hospital yesterday 10/23/24 with double pneumonia with fatigue; no fever, chills, no other symptoms.    Phone call placed to patient-no answer. Left message to call back to nurse triage. Spouse who is on DPR was called initially. Phone call placed to patient's wife. Spouse answered.  Reason for Disposition  SEVERE coughing spells (e.g., whooping sound after coughing, vomiting after coughing)  Answer Assessment - Initial Assessment Questions Patient was in the hospital on 12/14-12/15. Was diagnosed with double PNA. Was discharge yesterday. Patient reports coughing dark black sputum since being home from the hospital. States it isn't continuous. Patient is feeling fatigued but is feeling better than how he felt on Sunday. Patient is asking for recommendations from provider. Wife is asking for a call back to her phone.   1. ONSET: When did the cough begin?      Was diagnosed with double PNA on Sunday 2. SEVERITY: How bad is the cough today?      Moderate to severe 3. SPUTUM: Describe the color of your sputum (e.g., none, dry cough; clear, white, yellow, green)     Black sputum that started yesterday-every so often 4. HEMOPTYSIS: Are you coughing up any blood? If Yes, ask: How much? (e.g., flecks, streaks, tablespoons, etc.)     Wife  doesn't think so.  5. DIFFICULTY BREATHING: Are you having difficulty breathing? If Yes, ask: How bad is it? (e.g., mild, moderate, severe)      No shortness of breath 6. FEVER: Do you have a fever? If Yes, ask: What is your temperature, how was it measured, and when did it start?     no 7. CARDIAC HISTORY: Do you have any history of heart disease? (e.g., heart attack, congestive heart failure)      no 8. LUNG HISTORY: Do you have any history of lung disease?  (e.g., pulmonary embolus, asthma, emphysema)     yes 9. PE RISK FACTORS: Do you have a history of blood clots? (or: recent major surgery, recent prolonged travel, bedridden)     no 10. OTHER SYMPTOMS: Do you have any other symptoms? (e.g., runny nose, wheezing, chest pain)       fatigue 12. TRAVEL: Have you traveled out of the country in the last month? (e.g., travel history, exposures)       no  Protocols used: Cough - Acute Productive-A-AH

## 2024-10-25 ENCOUNTER — Other Ambulatory Visit: Payer: Self-pay

## 2024-10-25 NOTE — Transitions of Care (Post Inpatient/ED Visit) (Signed)
 10/25/2024  Name: Christian [name] MRN: [id number] DOB: 05-25-1951  Today's TOC FU Call Status: Today's TOC FU Call Status:: Successful TOC FU Call Completed Unsuccessful Call (1st Attempt) Date: 10/24/24 [facility] FU Call Complete Date: 10/25/24  Patient's Name and Date of Birth confirmed. Name, DOB  Transition Care Management Follow-up Telephone Call Date of Discharge: 10/23/24 Discharge Facility: [facility] Type of Discharge: Inpatient Admission Primary Inpatient Discharge Diagnosis:: altered mental status How have you been since you were released from the hospital?: Better Any questions or concerns?: No  Items Reviewed: Did you receive and understand the discharge instructions provided?: Yes Medications obtained,verified, and reconciled?: Yes (Medications Reviewed) Any new allergies since your discharge?: No Dietary orders reviewed?: Yes Do you have support at home?: Yes People in Home [RPT]: spouse  Medications Reviewed Today: Medications Reviewed Today     Reviewed by Emmitt Pan, LPN (Licensed Practical Nurse) on 10/25/24 at 1430  Med List Status: <None>   Medication Order Taking? Sig Documenting Provider Last Dose Status Informant  albuterol  (VENTOLIN  HFA) 108 (90 Base) MCG/ACT inhaler [id number] Yes Inhale 1-2 puffs into the lungs every 4 (four) hours as needed for wheezing or shortness of breath. Alexander, Natalie, DO  Active   amoxicillin -clavulanate (AUGMENTIN ) 875-125 MG tablet [id number] Yes Take 1 tablet by mouth 2 (two) times daily for 5 days. Alexander, Natalie, DO  Active   aspirin  81 MG tablet [id number] Yes Take 81 mg by mouth daily.  [provider]  Active Spouse/Significant Other           Med Note DORRENE, DON GAILS   Wed May 08, 2015  3:12 PM) Received from: [facility]  atorvastatin  (LIPITOR) 40 MG tablet [id number] Yes TAKE 1 TABLET(40 MG) BY MOUTH AT BEDTIME Simmons-Robinson, Makiera,  MD  Active Spouse/Significant Other  azithromycin  (ZITHROMAX  Z-PAK) 250 MG tablet [id number] Yes Take 1 tablet (250 mg total) by mouth daily. Alexander, Natalie, DO  Active   budesonide -glycopyrrolate -formoterol  (BREZTRI  AEROSPHERE) 160-9-4.8 MCG/ACT AERO inhaler [id number] Yes Inhale 2 puffs into the lungs in the morning and at bedtime. Alexander, Natalie, DO  Active   cholecalciferol  (VITAMIN D ) 1000 UNITS tablet [id number] Yes Take 1,000 Units by mouth daily.  [provider]  Active Spouse/Significant Other           Med Note DORRENE, DON GAILS   Wed May 08, 2015  3:12 PM) Received from: [facility]  empagliflozin  (JARDIANCE ) 10 MG TABS tablet [id number] Yes TAKE 1 TABLET BY MOUTH DAILY BEFORE BREAKFAST Simmons-Robinson, Makiera, MD  Active Spouse/Significant Other  glucose blood (CONTOUR NEXT TEST) test strip [id number] Yes 1 each by Other route daily. Use as instructed Bertrum Charlie LITTIE, MD  Active Spouse/Significant Other  meloxicam  (MOBIC ) 7.5 MG tablet [id number]  Take 1 tablet (7.5 mg total) by mouth daily.  Patient not taking: Reported on 10/25/2024   Simmons-Robinson, Rockie, MD  Active Spouse/Significant Other  metFORMIN  (GLUCOPHAGE ) 500 MG tablet [id number] Yes TAKE 2 TABLETS(1000 MG) BY MOUTH DAILY WITH BREAKFAST Simmons-Robinson, Makiera, MD  Active Spouse/Significant Other  MICROLET LANCETS MISC [id number] Yes 1 each by Does not apply route daily. Bertrum Charlie LITTIE, MD  Active Spouse/Significant Other  Olmesartan -amLODIPine -HCTZ 40-10-25 MG TABS [id number]  Take 1 tablet by mouth daily.  Patient not taking: Reported on 10/25/2024   Simmons-Robinson, Rockie, MD  Active Spouse/Significant Other  tiZANidine  (ZANAFLEX ) 4 MG tablet [id number] Yes Take 1 tablet (4 mg total) by mouth every 6 (six) hours as needed for  muscle spasms. Simmons-Robinson, Rockie, MD  Active Spouse/Significant Other  traMADol  (ULTRAM ) 50 MG tablet [id number] Yes Take 1 tablet (50 mg  total) by mouth every 8 (eight) hours as needed for up to 5 days. Simmons-Robinson, Makiera, MD  Active Spouse/Significant Other            Home Care and Equipment/Supplies: Were Home Health Services Ordered?: NA Any new equipment or medical supplies ordered?: NA  Functional Questionnaire: Do you need assistance with bathing/showering or dressing?: No Do you need assistance with meal preparation?: No Do you need assistance with eating?: No Do you have difficulty maintaining continence: No Do you need assistance with getting out of bed/getting out of a chair/moving?: No Do you have difficulty managing or taking your medications?: No  Follow up appointments reviewed: PCP Follow-up appointment confirmed?: Yes Date of PCP follow-up appointment?: 10/30/24 Follow-up Provider: [facility] Follow-up appointment confirmed?: NA Do you need transportation to your follow-up appointment?: No Do you understand care options if your condition(s) worsen?: Yes-patient verbalized understanding    SIGNATURE Julian Lemmings, LPN [facility] Nurse Health Advisor Direct Dial [phone]

## 2024-10-25 NOTE — Telephone Encounter (Signed)
 This could be sign of irritation to lung and mucosa of the upper airway due to pneumonia and should resolve on its on. Recommend completing discharge medications as prescribed

## 2024-10-26 NOTE — Telephone Encounter (Signed)
 Reviewed

## 2024-10-30 ENCOUNTER — Ambulatory Visit: Admitting: Family Medicine

## 2024-10-30 ENCOUNTER — Encounter: Payer: Self-pay | Admitting: Family Medicine

## 2024-10-30 VITALS — BP 128/64 | HR 65 | Ht 70.0 in | Wt 220.2 lb

## 2024-10-30 DIAGNOSIS — R4182 Altered mental status, unspecified: Secondary | ICD-10-CM | POA: Diagnosis not present

## 2024-10-30 DIAGNOSIS — M25512 Pain in left shoulder: Secondary | ICD-10-CM

## 2024-10-30 DIAGNOSIS — R051 Acute cough: Secondary | ICD-10-CM | POA: Diagnosis not present

## 2024-10-30 DIAGNOSIS — J189 Pneumonia, unspecified organism: Secondary | ICD-10-CM | POA: Diagnosis not present

## 2024-10-30 DIAGNOSIS — I10 Essential (primary) hypertension: Secondary | ICD-10-CM

## 2024-10-30 MED ORDER — OLMESARTAN MEDOXOMIL 20 MG PO TABS: 20.0000 mg | ORAL_TABLET | Freq: Every day | ORAL | 4 refills | Status: AC

## 2024-10-30 MED ORDER — HYDROCOD POLI-CHLORPHE POLI ER 10-8 MG/5ML PO SUER: 5.0000 mL | Freq: Two times a day (BID) | ORAL | 0 refills | Status: DC | PRN

## 2024-10-30 NOTE — Patient Instructions (Addendum)
 Please report to [facility] located at:  [address]  [place], [place] [address]  You do not need an appointment to have xrays completed.   Our office will follow up with  results once available.    Please go the week of Jan 12th for your chest imaging   To keep you healthy, please keep in mind the following health maintenance items that you are due for:   Health Maintenance Due  Topic Date Due   Zoster Vaccines- Shingrix (1 of 2) 06/21/1999     Best Wishes,   Dr. Lang

## 2024-10-30 NOTE — Progress Notes (Signed)
 "  Established Patient Office Visit  Patient ID: Christian [name], male    DOB: 28-Aug-1951  Age: 73 y.o. MRN: [id number] PCP: Sharma Coyer, MD  Chief Complaint  Patient presents with   Follow-up    ED f/u from 10/22/2024 dx pneumonia, doing better today slowly but surely. Lower energy today     Subjective:     HPI  Discussed the use of AI scribe software for clinical note transcription with the patient, who gave verbal consent to proceed.  History of Present Illness Christian [name] is a 73 yo male with hx of GERD, decreased hearing. HLD, htn, t2dm, reactive airway disease presenting for hospital follow up after presenting with AMS and found to have bilateral lower lobe pneumonia, hospitalized from 12/14-12/15/25.   TOC call was completed 10/25/24  In the ED he had normal BNP and troponin levels, WBC count of 17.6. RSV,Flu and COVID tests were all negative. He had a CTA of the chest that was negative for pulmonary embolism but showed bilateral lower lobe bronchopulmonary pneumonia. He required 3 liters of supplemental oxygen. He was treated with Cetriaxone and azithromycin    He was recently hospitalized for pneumonia affecting both lower lobes, during which he received antibiotics and supplemental oxygen. He experienced an altered mental state due to hypoxemia, with an oxygen saturation of 88%. He completed a course of oral antibiotics on October 22, 2024, and feels better now.  During his hospital stay, his blood pressure medication regimen was adjusted. He was previously on a combination pill containing olmesartan , amlodipine , and hydrochlorothiazide , which was held. He has not been eating normally since the hospitalization, consuming mainly yogurt, pumpkin pie, soup, and turkey. He notes altered taste and smell, possibly due to the antibiotics affecting his stomach.  He has a history of chronic cough, which he attributes to bronchial issues. Recently, he coughed up dark  sputum, which has since cleared. He describes his persistent cough as 'irritative' and has not been using his inhaler correctly. His caregiver notes that he has been coughing up black sputum, which appeared like dried blood, but this has resolved.  He reports improved balance and shoulder pain, which he attributes to sinus issues. He has been using Mobic  for inflammation.     ROS     10/30/2024   10:01 AM 10/20/2024    8:16 AM 04/20/2024    8:16 AM  PHQ9 SCORE ONLY  PHQ-9 Total Score 5 0 0      Data saved with a previous flowsheet row definition      10/30/2024   10:01 AM 10/20/2024    8:16 AM 04/20/2024    8:17 AM 12/22/2023    8:16 AM  GAD 7 : Generalized Anxiety Score  Nervous, Anxious, on Edge 1 0 0 0  Control/stop worrying 1 0 0 0  Worry too much - different things 0 0 0 0  Trouble relaxing 1 0 0 0  Restless 0 0 0 0  Easily annoyed or irritable 0 0 0 0  Afraid - awful might happen 0 0 0 0  Total GAD 7 Score 3 0 0 0  Anxiety Difficulty Somewhat difficult Not difficult at all Not difficult at all Not difficult at all       Objective:     BP 128/64 (BP Location: Left Arm, Patient Position: Sitting, Cuff Size: Normal)   Pulse 65   Ht 5' 10 (1.778 m)   Wt 220 lb 3.2 oz (99.9 kg)   SpO2 96%  BMI 31.60 kg/m    Physical Exam  Physical Exam VITALS: BP- 128/ CHEST: Crackles at lung bases, air moving well, slight wheeze on cough.   No results found for any visits on 10/30/24.    The ASCVD Risk score (Arnett DK, et al., 2019) failed to calculate for the following reasons:   The valid total cholesterol range is 130 to 320 mg/dL  CT Angio Chest PE W and/or Wo Contrast Result Date: 10/22/2024 EXAM: CTA of the Chest with contrast for PE 10/22/2024 09:01:01 PM TECHNIQUE: CTA of the chest was performed after the administration of 75 mL of intravenous contrast (iohexol  (OMNIPAQUE ) 350 MG/ML injection 75 mL IOHEXOL  350 MG/ML SOLN). Multiplanar reformatted images are  provided for review. MIP images are provided for review. Automated exposure control, iterative reconstruction, and/or weight based adjustment of the mA/kV was utilized to reduce the radiation dose to as low as reasonably achievable. COMPARISON: AP and lateral chest from today and from 10/17/2020. No prior CT. CLINICAL HISTORY: Hypoxia, SOB, AMS. FINDINGS: PULMONARY ARTERIES: Pulmonary arteries are adequately opacified for evaluation. No arterial embolism is seen. There is a prominent pulmonary trunk measuring 3.2 cm and mild prominence of the left and right main pulmonary arteries are consistent with arterial hypertension. No right heart strain findings. MEDIASTINUM: There is mild to moderate panchamber cardiomegaly. No pericardial effusion. There are scattered single vessel calcifications in the proximal LAD coronary artery. There is lipomatous hypertrophy of the interatrial septum. The pulmonary veins are normal caliber. The aorta is tortuous with moderate calcific plaques, scattered calcification in the great vessels. There is no aneurysm, stenosis, or dissection. There is a 1.1 cm hypodense nodule in the right lobe of the thyroid  gland. No follow-up imaging is recommended. Mild Mediastinal lipomatosis is noted. LYMPH NODES: Bilateral borderline size hilar lymph nodes are present, likely reactive, but no enlarged thoracic lymph nodes by strict size criteria. LUNGS AND PLEURA: There is diffuse bronchial thickening. In the lower lobes there are scattered bilateral subsegmental bronchial impactions. There is bilateral patchy lower lobe peribronchovascular airspace consolidation in the basal segments consistent with bronchopneumonia. Small airspace infiltrate also in the posterior right middle lobe. There is mild elevation of the right hemidiaphragm. The upper lobes are clear. There is no pleural effusion or pneumothorax. UPPER ABDOMEN: In the abdomen the liver is mildly enlarged and steatotic. There is prominence of  the hepatic portal vein measuring 15 mm, but no splenomegaly. Moderate fatty replacement of the pancreas, particularly the head and neck portions. No acute upper abdominal findings. SOFT TISSUES AND BONES: There is multilevel thoracic spine bridging enthesopathy without acute or suspicious bone abnormality. There is mild arthrosis in both shoulders. No acute soft tissue abnormality. IMPRESSION: 1. Diffuse bronchitis with Bilateral lower lobe bronchopneumonia with a small additional airspace infiltrate in the posterior right middle lobe. Follow-up study recommended to ensure clearing after treatment. 2. No pulmonary embolism or right heart strain. Mildly prominent pulmonary trunk and main arteries. 3. Mild to moderate panchamber cardiomegaly with lipomatous hypertrophy of the interatrial septum. 4. Mediastinal Lipomatosis. 5. Borderline prominent bilateral hilar lymph nodes, likely reactive. 6. Steatosis of the liver with mild prominence in the hepatic portal vein. Electronically signed by: Francis Quam MD 10/22/2024 09:22 PM EST RP Workstation: [id number]   DG Chest 2 View Result Date: 10/22/2024 EXAM: 2 VIEW(S) XRAY OF THE CHEST 10/22/2024 05:50:33 PM COMPARISON: 10/17/2020 CLINICAL HISTORY: AMS (altered mental status) FINDINGS: LUNGS AND PLEURA: Low lung volumes with bibasilar atelectasis. Possible trace bilateral pleural effusions.  No pneumothorax. HEART AND MEDIASTINUM: No acute abnormality of the cardiac and mediastinal silhouettes. BONES AND SOFT TISSUES: No acute osseous abnormality. IMPRESSION: 1. Low lung volumes with bibasilar atelectasis. 2. Possible trace bilateral pleural effusions. Electronically signed by: Pinkie Pebbles MD 10/22/2024 06:17 PM EST RP Workstation: [id number]      Assessment & Plan:   Problem List Items Addressed This Visit     Essential hypertension   Relevant Medications   olmesartan  (BENICAR ) 20 MG tablet   Other Visit Diagnoses       Pneumonia of both lungs due  to infectious organism, unspecified part of lung    -  Primary   Relevant Medications   chlorpheniramine-HYDROcodone  (TUSSIONEX) 10-8 MG/5ML   Other Relevant Orders   DG Chest 2 View     Acute cough       Relevant Medications   chlorpheniramine-HYDROcodone  (TUSSIONEX) 10-8 MG/5ML       Assessment and Plan Assessment & Plan Pneumonia complicated by acute hypoxia  Acute, improving  Bilateral lower lobe pneumonia with low lung volumes, bibasilar atelectasis, and possible trace bilateral pleural effusion. Treated with antibiotics, including alemtuzumab and Cytra. Oxygen saturation was low during hospitalization, leading to altered mental status. No pulmonary embolism detected. Residual crackles and wheezing present, but overall improvement noted. Coughing up dark material likely due to infection-related irritation, now resolved. - Ordered chest x-ray to assess resolution of pneumonia, to be done after the new year.  Essential hypertension Chronic  Blood pressure well controlled at 128/84 mmHg. Previous medication regimen included a combination of olmesartan , amlodipine , and hydrochlorothiazide , which was held during hospitalization. Plan to resume olmesartan  at a lower dose for renal protection and blood pressure control. Decision to stop amlodipine -hydrochlorothiazide  combination due to well-controlled blood pressure and need for renal protection. - Resumed olmesartan  at 20 mg for blood pressure control and renal protection. - Discontinued amlodipine -hydrochlorothiazide  combination. - Will reassess blood pressure in a couple of months.  Acute cough Persistent cough likely due to recent pneumonia and bronchial irritation. Coughing up dark material resolved. Cough now more of an irritative nature, possibly due to residual effects of infection.    Return in about 3 months (around 01/28/2025) for Chronic F/U.    Rockie Agent, MD [facility]   "

## 2024-11-07 ENCOUNTER — Inpatient Hospital Stay: Admitting: Family Medicine

## 2024-11-18 ENCOUNTER — Other Ambulatory Visit: Payer: Self-pay | Admitting: Family Medicine

## 2024-11-18 DIAGNOSIS — M25512 Pain in left shoulder: Secondary | ICD-10-CM

## 2024-11-20 ENCOUNTER — Ambulatory Visit
Admission: RE | Admit: 2024-11-20 | Discharge: 2024-11-20 | Disposition: A | Source: Ambulatory Visit | Attending: Family Medicine

## 2024-11-20 ENCOUNTER — Ambulatory Visit
Admission: RE | Admit: 2024-11-20 | Discharge: 2024-11-20 | Disposition: A | Discharge status: Home / Self Care | Attending: Family Medicine | Admitting: Family Medicine

## 2024-11-20 DIAGNOSIS — J189 Pneumonia, unspecified organism: Secondary | ICD-10-CM | POA: Insufficient documentation

## 2024-11-21 ENCOUNTER — Other Ambulatory Visit: Payer: Self-pay | Admitting: Family Medicine

## 2024-11-21 NOTE — Telephone Encounter (Unsigned)
 Copied from CRM #[id number]. Topic: Clinical - Medication Refill >> Nov 21, 2024  8:35 AM Darshell M wrote: Medication: budesonide -glycopyrrolate -formoterol  (BREZTRI  AEROSPHERE) 160-9-4.8 MCG/ACT AERO inhaler  Has the patient contacted their pharmacy? Yes (Agent: If no, request that the patient contact the pharmacy for the refill. If patient does not wish to contact the pharmacy document the reason why and proceed with request.) (Agent: If yes, when and what did the pharmacy advise?)  This is the patient's preferred pharmacy:  [id number] [place], [place] - [address] AT [facility] [address] [place] [address] Phone: [phone] Fax: [phone]   Is this the correct pharmacy for this prescription? Yes If no, delete pharmacy and type the correct one.   Has the prescription been filled recently? No  Is the patient out of the medication? Yes  Has the patient been seen for an appointment in the last year OR does the patient have an upcoming appointment? Yes  Can we respond through MyChart? Yes  Agent: Please be advised that Rx refills may take up to 3 business days. We ask that you follow-up with your pharmacy.

## 2024-11-21 NOTE — Telephone Encounter (Signed)
 Appears this may have been ordered IP

## 2024-11-22 ENCOUNTER — Other Ambulatory Visit: Payer: Self-pay

## 2024-11-22 NOTE — Telephone Encounter (Signed)
 Copied from CRM [id number]. Topic: Clinical - Prescription Issue >> Nov 22, 2024 10:34 AM Travis F wrote: Reason for CRM: Patient's wife is calling in because patient is in need of a new prescription for  budesonide -glycopyrrolate -formoterol  (BREZTRI  AEROSPHERE) 160-9-4.8 MCG/ACT AERO inhaler, & Meloxicam  7.5 MG. He was prescribed the Beztri in the hospital and is needing it. He was told by the doctor at [facility] to have his PCP fill the Meloxicam . She is requesting this be done urgently. Please follow up with Darice.

## 2024-11-22 NOTE — Telephone Encounter (Signed)
 Requested medication (s) are due for refill today: yes  Requested medication (s) are on the active medication list: yes  Last refill:  10/23/24  Future visit scheduled: yes  Notes to clinic:  Medication not assigned to a protocol, review manually.     Requested Prescriptions  Pending Prescriptions Disp Refills   budesonide -glycopyrrolate -formoterol  (BREZTRI  AEROSPHERE) 160-9-4.8 MCG/ACT AERO inhaler 10.7 g 0    Sig: Inhale 2 puffs into the lungs in the morning and at bedtime.     Off-Protocol Failed - 11/22/2024  8:45 AM      Failed - Medication not assigned to a protocol, review manually.      Passed - Valid encounter within last 12 months    Recent Outpatient Visits           3 weeks ago Pneumonia of both lungs due to infectious organism, unspecified part of lung   [place] [facility] Simmons-Robinson, Pearl, MD   1 month ago Acute pain of left shoulder   [place] [facility] Simmons-Robinson, [place], MD   7 months ago Encounter for annual wellness visit (AWV) in Medicare patient   [place] [facility] [place], [place], MD   11 months ago Type 2 diabetes mellitus with hyperglycemia, without long-term current use of insulin  [facility])   [place] [facility] Sharma Coyer, MD

## 2024-11-23 ENCOUNTER — Encounter: Payer: Self-pay | Admitting: Family Medicine

## 2024-11-23 ENCOUNTER — Other Ambulatory Visit: Payer: Self-pay | Admitting: Family Medicine

## 2024-11-23 DIAGNOSIS — M25512 Pain in left shoulder: Secondary | ICD-10-CM

## 2024-11-23 MED ORDER — BREZTRI AEROSPHERE 160-9-4.8 MCG/ACT IN AERO: 2.0000 | INHALATION_SPRAY | Freq: Two times a day (BID) | RESPIRATORY_TRACT | 2 refills | Status: AC

## 2024-11-23 MED ORDER — BREZTRI AEROSPHERE 160-9-4.8 MCG/ACT IN AERO: 2.0000 | INHALATION_SPRAY | Freq: Two times a day (BID) | RESPIRATORY_TRACT | 2 refills | Status: DC

## 2024-11-27 ENCOUNTER — Ambulatory Visit: Payer: Self-pay | Admitting: Family Medicine

## 2024-11-28 ENCOUNTER — Other Ambulatory Visit: Payer: Self-pay | Admitting: Family Medicine

## 2024-11-28 DIAGNOSIS — R051 Acute cough: Secondary | ICD-10-CM

## 2024-11-28 MED ORDER — HYDROCOD POLI-CHLORPHE POLI ER 10-8 MG/5ML PO SUER: 5.0000 mL | Freq: Two times a day (BID) | ORAL | 0 refills | Status: AC | PRN

## 2025-02-01 ENCOUNTER — Ambulatory Visit: Admitting: Family Medicine

## 2025-04-23 ENCOUNTER — Encounter: Admitting: Family Medicine

## 2025-04-24 ENCOUNTER — Encounter: Admitting: Family Medicine
